# Patient Record
Sex: Male | Born: 1940 | Race: Black or African American | Hispanic: No | Smoking: Former smoker
Health system: Southern US, Community
[De-identification: ages and names within clinical notes are randomized; demographics above are authoritative.]

## PROBLEM LIST (undated history)

## (undated) DIAGNOSIS — Z531 Procedure and treatment not carried out because of patient's decision for reasons of belief and group pressure: Secondary | ICD-10-CM

## (undated) DIAGNOSIS — N289 Disorder of kidney and ureter, unspecified: Secondary | ICD-10-CM

## (undated) DIAGNOSIS — B182 Chronic viral hepatitis C: Secondary | ICD-10-CM

## (undated) DIAGNOSIS — N186 End stage renal disease: Secondary | ICD-10-CM

## (undated) DIAGNOSIS — I5022 Chronic systolic (congestive) heart failure: Secondary | ICD-10-CM

## (undated) DIAGNOSIS — I10 Essential (primary) hypertension: Secondary | ICD-10-CM

## (undated) DIAGNOSIS — C189 Malignant neoplasm of colon, unspecified: Secondary | ICD-10-CM

## (undated) DIAGNOSIS — Z992 Dependence on renal dialysis: Secondary | ICD-10-CM

---

## 2019-11-27 ENCOUNTER — Other Ambulatory Visit: Payer: Self-pay | Admitting: Family Medicine

## 2019-11-27 DIAGNOSIS — N183 Chronic kidney disease, stage 3 unspecified: Secondary | ICD-10-CM

## 2019-11-27 DIAGNOSIS — I10 Essential (primary) hypertension: Secondary | ICD-10-CM

## 2019-12-01 ENCOUNTER — Ambulatory Visit
Admission: RE | Admit: 2019-12-01 | Discharge: 2019-12-01 | Disposition: A | Discharge status: Home / Self Care | Payer: Medicare PPO | Source: Ambulatory Visit | Attending: Family Medicine | Admitting: Family Medicine

## 2019-12-01 DIAGNOSIS — N183 Chronic kidney disease, stage 3 unspecified: Secondary | ICD-10-CM

## 2019-12-01 DIAGNOSIS — I10 Essential (primary) hypertension: Secondary | ICD-10-CM

## 2020-02-28 ENCOUNTER — Other Ambulatory Visit: Payer: Self-pay | Admitting: Physician Assistant

## 2020-02-28 DIAGNOSIS — B1920 Unspecified viral hepatitis C without hepatic coma: Secondary | ICD-10-CM

## 2020-03-20 ENCOUNTER — Ambulatory Visit
Admission: RE | Admit: 2020-03-20 | Discharge: 2020-03-20 | Disposition: A | Discharge status: Home / Self Care | Payer: Medicare Other | Source: Ambulatory Visit | Attending: Physician Assistant | Admitting: Physician Assistant

## 2020-03-20 DIAGNOSIS — B1920 Unspecified viral hepatitis C without hepatic coma: Secondary | ICD-10-CM

## 2020-09-03 ENCOUNTER — Other Ambulatory Visit: Payer: Self-pay | Admitting: Physician Assistant

## 2020-09-03 DIAGNOSIS — K7460 Unspecified cirrhosis of liver: Secondary | ICD-10-CM

## 2020-10-10 ENCOUNTER — Ambulatory Visit
Admission: RE | Admit: 2020-10-10 | Discharge: 2020-10-10 | Disposition: A | Discharge status: Home / Self Care | Payer: Medicare Other | Source: Ambulatory Visit | Attending: Physician Assistant | Admitting: Physician Assistant

## 2020-10-10 DIAGNOSIS — K7460 Unspecified cirrhosis of liver: Secondary | ICD-10-CM

## 2020-11-18 ENCOUNTER — Encounter (HOSPITAL_COMMUNITY): Payer: Self-pay | Admitting: *Deleted

## 2020-11-18 ENCOUNTER — Emergency Department (HOSPITAL_COMMUNITY)
Admission: EM | Admit: 2020-11-18 | Discharge: 2020-11-18 | Disposition: A | Discharge status: Home / Self Care | Payer: Medicare Other | Source: Home / Self Care | Attending: Emergency Medicine | Admitting: Emergency Medicine

## 2020-11-18 ENCOUNTER — Other Ambulatory Visit: Payer: Self-pay

## 2020-11-18 ENCOUNTER — Emergency Department (HOSPITAL_COMMUNITY): Payer: Medicare Other

## 2020-11-18 DIAGNOSIS — I132 Hypertensive heart and chronic kidney disease with heart failure and with stage 5 chronic kidney disease, or end stage renal disease: Secondary | ICD-10-CM | POA: Diagnosis not present

## 2020-11-18 DIAGNOSIS — R0789 Other chest pain: Secondary | ICD-10-CM

## 2020-11-18 DIAGNOSIS — J810 Acute pulmonary edema: Secondary | ICD-10-CM | POA: Diagnosis not present

## 2020-11-18 DIAGNOSIS — I10 Essential (primary) hypertension: Secondary | ICD-10-CM | POA: Insufficient documentation

## 2020-11-18 DIAGNOSIS — Z79899 Other long term (current) drug therapy: Secondary | ICD-10-CM | POA: Insufficient documentation

## 2020-11-18 DIAGNOSIS — R079 Chest pain, unspecified: Secondary | ICD-10-CM | POA: Insufficient documentation

## 2020-11-18 HISTORY — DX: Disorder of kidney and ureter, unspecified: N28.9

## 2020-11-18 LAB — TROPONIN I (HIGH SENSITIVITY)
Troponin I (High Sensitivity): 44 ng/L — ABNORMAL HIGH (ref <18)
Troponin I (High Sensitivity): 45 ng/L — ABNORMAL HIGH (ref <18)

## 2020-11-18 LAB — CBC WITH DIFFERENTIAL/PLATELET
Abs Immature Granulocytes: 0.05 10*3/uL (ref 0.00–0.07)
Basophils Absolute: 0.1 10*3/uL (ref 0.0–0.1)
Basophils Relative: 1 %
Eosinophils Absolute: 0.1 10*3/uL (ref 0.0–0.5)
Eosinophils Relative: 1 %
HCT: 28.6 % — ABNORMAL LOW (ref 39.0–52.0)
Hemoglobin: 9.5 g/dL — ABNORMAL LOW (ref 13.0–17.0)
Immature Granulocytes: 1 %
Lymphocytes Relative: 18 %
Lymphs Abs: 1.7 10*3/uL (ref 0.7–4.0)
MCH: 30.7 pg (ref 26.0–34.0)
MCHC: 33.2 g/dL (ref 30.0–36.0)
MCV: 92.6 fL (ref 80.0–100.0)
Monocytes Absolute: 0.7 10*3/uL (ref 0.1–1.0)
Monocytes Relative: 7 %
Neutro Abs: 7.1 10*3/uL (ref 1.7–7.7)
Neutrophils Relative %: 72 %
Platelets: 170 10*3/uL (ref 150–400)
RBC: 3.09 MIL/uL — ABNORMAL LOW (ref 4.22–5.81)
RDW: 14.5 % (ref 11.5–15.5)
WBC: 9.7 10*3/uL (ref 4.0–10.5)
nRBC: 0 % (ref 0.0–0.2)

## 2020-11-18 LAB — COMPREHENSIVE METABOLIC PANEL
ALT: 31 U/L (ref 0–44)
AST: 18 U/L (ref 15–41)
Albumin: 3 g/dL — ABNORMAL LOW (ref 3.5–5.0)
Alkaline Phosphatase: 116 U/L (ref 38–126)
Anion gap: 12 (ref 5–15)
BUN: 79 mg/dL — ABNORMAL HIGH (ref 8–23)
CO2: 23 mmol/L (ref 22–32)
Calcium: 9.2 mg/dL (ref 8.9–10.3)
Chloride: 104 mmol/L (ref 98–111)
Creatinine, Ser: 9.78 mg/dL — ABNORMAL HIGH (ref 0.61–1.24)
GFR, Estimated: 5 mL/min — ABNORMAL LOW (ref >60)
Glucose, Bld: 97 mg/dL (ref 70–99)
Potassium: 4.4 mmol/L (ref 3.5–5.1)
Sodium: 139 mmol/L (ref 135–145)
Total Bilirubin: 0.9 mg/dL (ref 0.3–1.2)
Total Protein: 7.2 g/dL (ref 6.5–8.1)

## 2020-11-18 NOTE — ED Triage Notes (Signed)
Pt arrived by [facility] from home. Pt reported mid chest pain radiating to left arm. Pt took 2 ASA pta (324mg  each) and that relieved his chest pain pta. Only complaint at this time is generalized weakness. Dialysis pt, due for treatment today.

## 2020-11-18 NOTE — ED Notes (Signed)
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

## 2020-11-18 NOTE — ED Notes (Signed)
ED PA at [facility] 

## 2020-11-18 NOTE — Discharge Instructions (Signed)
Return if any problems.  Follow up with your physician for recheck.  Call your dialysis center

## 2020-11-18 NOTE — ED Provider Notes (Signed)
Emergency Medicine Provider Triage Evaluation Note  [name] Colon , a 80 y.o. male  was evaluated in triage.  Pt complains of chest pain  Review of Systems  Positive: Chest pain Negative: No fever  Physical Exam  BP (!) 168/61 (BP Location: Right Arm)   Pulse 68   Temp 98.6 F (37 C)   Resp 14   SpO2 100%  Gen:   Awake, no distress   Resp:  Normal effort  MSK:   Moves extremities without difficulty  Other:    Medical Decision Making  Medically screening exam initiated at 11:33 AM.  Appropriate orders placed.  Dameion Briles was informed that the remainder of the evaluation will be completed by another provider, this initial triage assessment does not replace that evaluation, and the importance of remaining in the ED until their evaluation is complete.     Fransico Meadow, PA-C 11/18/20 1133    Horton, Alvin Critchley, DO 11/19/20 1507

## 2020-11-18 NOTE — ED Notes (Signed)
Checked on patient. Denies chest pain. Just feels weak. No distress.

## 2020-11-18 NOTE — ED Provider Notes (Signed)
[facility] EMERGENCY DEPARTMENT Provider Note   CSN: [id number] Arrival date & time: 11/18/20  1112     History Chief Complaint  Patient presents with   Chest Pain    [name] Colon is a 80 y.o. male.  Pt reports he began having chest pain this am.  Pt took 2 aspirin and pain was relieved.  Pt was scheduled for dialysis today but did not go because of chest pain.  Pt denies shortness of breath.  Pt reports he still makes some urine and does not feel fluid overloaded   The history is provided by the patient. No language interpreter was used.  Chest Pain Pain location:  L chest Pain quality: aching   Pain radiates to:  Does not radiate Pain severity:  Moderate Onset quality:  Sudden Timing:  Constant Progression:  Worsening Chronicity:  New Relieved by:  Nothing Worsened by:  Nothing Ineffective treatments:  None tried Associated symptoms: no abdominal pain   Risk factors: hypertension       Past Medical History:  Diagnosis Date   Renal disorder     There are no problems to display for this patient.       No family history on file.     Home Medications Prior to Admission medications   Medication Sig Start Date End Date Taking? Authorizing Provider  amLODipine (NORVASC) 10 MG tablet Take 10 mg by mouth daily. 11/16/20  Yes [provider]  Brinzolamide-Brimonidine (SIMBRINZA) 1-0.2 % SUSP Place 1 drop into the left eye 2 (two) times daily.   Yes [provider]  ketorolac (ACULAR) 0.4 % SOLN Place 1 drop into the left eye 4 (four) times daily. 11/05/20  Yes [provider]  multivitamin (RENA-VIT) TABS tablet Take 1 tablet by mouth daily. 11/16/20  Yes [provider]  ofloxacin (OCUFLOX) 0.3 % ophthalmic solution Place 1 drop into the left eye 4 (four) times daily. 11/04/20  Yes [provider]  omeprazole (PRILOSEC) 20 MG capsule Take 20 mg by mouth every evening. 10/29/20  Yes [provider]  prednisoLONE acetate (PRED FORTE) 1 % ophthalmic suspension Place 1 drop into the left eye 4 (four) times daily. 11/04/20  Yes [provider]  torsemide (DEMADEX) 20 MG tablet Take 20 mg by mouth daily. 11/16/20  Yes [provider]    Allergies    Patient has no known allergies.  Review of Systems   Review of Systems  Cardiovascular:  Positive for chest pain.  Gastrointestinal:  Negative for abdominal pain.  All other systems reviewed and are negative.  Physical Exam Updated Vital Signs BP (!) 165/63   Pulse (!) 59   Temp 98.6 F (37 C)   Resp 15   SpO2 99%   Physical Exam Constitutional:      Appearance: He is well-developed.  Cardiovascular:     Rate and Rhythm: Normal rate and regular rhythm.     Heart sounds: Normal heart sounds.  Pulmonary:     Breath sounds: Normal breath sounds.  Abdominal:     Palpations: Abdomen is soft.  Musculoskeletal:        General: Normal range of motion.     Cervical back: Normal range of motion.  Skin:    General: Skin is warm.  Neurological:     General: No focal deficit present.     Mental Status: He is alert.  Psychiatric:        Mood and Affect: Mood normal.  ED Results / Procedures / Treatments   Labs (all labs ordered are listed, but only abnormal results are displayed) Labs Reviewed  CBC WITH DIFFERENTIAL/PLATELET - Abnormal; Notable for the following components:      Result Value   RBC 3.09 (*)    Hemoglobin 9.5 (*)    HCT 28.6 (*)    All other components within normal limits  COMPREHENSIVE METABOLIC PANEL - Abnormal; Notable for the following components:   BUN 79 (*)    Creatinine, Ser 9.78 (*)    Albumin 3.0 (*)    GFR, Estimated 5 (*)    All other components within normal limits  TROPONIN I (HIGH SENSITIVITY) - Abnormal; Notable for the following components:   Troponin I (High Sensitivity) 44 (*)    All other components within normal limits  TROPONIN I (HIGH SENSITIVITY)     EKG None  Radiology DG Chest 2 View  Result Date: 11/18/2020 CLINICAL DATA:  LEFT side chest pain into LEFT arm beginning this morning EXAM: CHEST - 2 VIEW COMPARISON:  None FINDINGS: RIGHT jugular dual lumen central venous catheter with tip projecting over cavoatrial junction. Enlargement of cardiac silhouette with pulmonary vascular congestion. Atherosclerotic calcification aorta. Subsegmental atelectasis RIGHT base. No definite acute infiltrate or pneumothorax. Tiny pleural effusions blunt the posterior costophrenic angles, greater on RIGHT. No acute osseous findings. IMPRESSION: Enlargement of cardiac silhouette with pulmonary vascular congestion. RIGHT basilar atelectasis and tiny pleural effusions. Electronically Signed   By: Lavonia Dana M.D.   On: 11/18/2020 12:38    Procedures Procedures   Medications Ordered in ED Medications - No data to display  ED Course  I have reviewed the triage vital signs and the nursing notes.  Pertinent labs & imaging results that were available during my care of the patient were reviewed by me and considered in my medical decision making (see chart for details).  Clinical Course as of 11/18/20 1727  Mon Nov 18, 2020  1714 Troponin I (High Sensitivity)(!) [LS]    Clinical Course User Index [LS] Fransico Meadow, [place]   MDM Rules/Calculators/A&P                           MDM: EKG no acute abnormality  troponin 44 and 45.  Pt remains pain free.   Dr. Tyrone Nine in to see and examine Pt advised to follow up with his MD.  Call dialysis center to be seen tomorrow  Final Clinical Impression(s) / ED Diagnoses Final diagnoses:  Chest pain    Rx / DC Orders ED Discharge Orders     None     An After Visit Summary was printed and given to the patient.    Fransico Meadow, PA-C 11/18/20 [place], [place], DO 11/18/20 [phone]

## 2020-11-19 ENCOUNTER — Inpatient Hospital Stay (HOSPITAL_COMMUNITY)
Admission: EM | Admit: 2020-11-19 | Discharge: 2020-12-03 | DRG: 280 | Disposition: A | Discharge status: Home Health | Payer: Medicare Other | Attending: Internal Medicine | Admitting: Internal Medicine

## 2020-11-19 ENCOUNTER — Emergency Department (HOSPITAL_COMMUNITY): Payer: Medicare Other

## 2020-11-19 ENCOUNTER — Other Ambulatory Visit: Payer: Self-pay

## 2020-11-19 ENCOUNTER — Encounter (HOSPITAL_COMMUNITY): Payer: Self-pay

## 2020-11-19 DIAGNOSIS — N186 End stage renal disease: Secondary | ICD-10-CM

## 2020-11-19 DIAGNOSIS — E1122 Type 2 diabetes mellitus with diabetic chronic kidney disease: Secondary | ICD-10-CM | POA: Diagnosis present

## 2020-11-19 DIAGNOSIS — R001 Bradycardia, unspecified: Secondary | ICD-10-CM | POA: Diagnosis not present

## 2020-11-19 DIAGNOSIS — Z20822 Contact with and (suspected) exposure to covid-19: Secondary | ICD-10-CM | POA: Diagnosis present

## 2020-11-19 DIAGNOSIS — J9601 Acute respiratory failure with hypoxia: Secondary | ICD-10-CM | POA: Diagnosis present

## 2020-11-19 DIAGNOSIS — I953 Hypotension of hemodialysis: Secondary | ICD-10-CM | POA: Diagnosis not present

## 2020-11-19 DIAGNOSIS — R1013 Epigastric pain: Secondary | ICD-10-CM

## 2020-11-19 DIAGNOSIS — Z8719 Personal history of other diseases of the digestive system: Secondary | ICD-10-CM

## 2020-11-19 DIAGNOSIS — R0603 Acute respiratory distress: Secondary | ICD-10-CM

## 2020-11-19 DIAGNOSIS — J9811 Atelectasis: Secondary | ICD-10-CM | POA: Diagnosis not present

## 2020-11-19 DIAGNOSIS — E871 Hypo-osmolality and hyponatremia: Secondary | ICD-10-CM | POA: Diagnosis not present

## 2020-11-19 DIAGNOSIS — I429 Cardiomyopathy, unspecified: Secondary | ICD-10-CM | POA: Diagnosis present

## 2020-11-19 DIAGNOSIS — R55 Syncope and collapse: Secondary | ICD-10-CM | POA: Diagnosis present

## 2020-11-19 DIAGNOSIS — K7460 Unspecified cirrhosis of liver: Secondary | ICD-10-CM | POA: Diagnosis present

## 2020-11-19 DIAGNOSIS — I214 Non-ST elevation (NSTEMI) myocardial infarction: Secondary | ICD-10-CM | POA: Diagnosis not present

## 2020-11-19 DIAGNOSIS — Z9221 Personal history of antineoplastic chemotherapy: Secondary | ICD-10-CM

## 2020-11-19 DIAGNOSIS — E785 Hyperlipidemia, unspecified: Secondary | ICD-10-CM | POA: Diagnosis present

## 2020-11-19 DIAGNOSIS — K226 Gastro-esophageal laceration-hemorrhage syndrome: Secondary | ICD-10-CM

## 2020-11-19 DIAGNOSIS — K2211 Ulcer of esophagus with bleeding: Secondary | ICD-10-CM | POA: Diagnosis present

## 2020-11-19 DIAGNOSIS — Z992 Dependence on renal dialysis: Secondary | ICD-10-CM

## 2020-11-19 DIAGNOSIS — R079 Chest pain, unspecified: Secondary | ICD-10-CM | POA: Diagnosis present

## 2020-11-19 DIAGNOSIS — Z79899 Other long term (current) drug therapy: Secondary | ICD-10-CM

## 2020-11-19 DIAGNOSIS — R778 Other specified abnormalities of plasma proteins: Secondary | ICD-10-CM

## 2020-11-19 DIAGNOSIS — Z8619 Personal history of other infectious and parasitic diseases: Secondary | ICD-10-CM

## 2020-11-19 DIAGNOSIS — R5381 Other malaise: Secondary | ICD-10-CM | POA: Diagnosis present

## 2020-11-19 DIAGNOSIS — I10 Essential (primary) hypertension: Secondary | ICD-10-CM | POA: Diagnosis present

## 2020-11-19 DIAGNOSIS — Z9049 Acquired absence of other specified parts of digestive tract: Secondary | ICD-10-CM

## 2020-11-19 DIAGNOSIS — K2100 Gastro-esophageal reflux disease with esophagitis, without bleeding: Secondary | ICD-10-CM | POA: Diagnosis present

## 2020-11-19 DIAGNOSIS — D62 Acute posthemorrhagic anemia: Secondary | ICD-10-CM | POA: Diagnosis not present

## 2020-11-19 DIAGNOSIS — K449 Diaphragmatic hernia without obstruction or gangrene: Secondary | ICD-10-CM | POA: Diagnosis present

## 2020-11-19 DIAGNOSIS — Z9115 Patient's noncompliance with renal dialysis: Secondary | ICD-10-CM

## 2020-11-19 DIAGNOSIS — K7469 Other cirrhosis of liver: Secondary | ICD-10-CM | POA: Diagnosis present

## 2020-11-19 DIAGNOSIS — I951 Orthostatic hypotension: Secondary | ICD-10-CM | POA: Diagnosis not present

## 2020-11-19 DIAGNOSIS — E8770 Fluid overload, unspecified: Secondary | ICD-10-CM | POA: Diagnosis present

## 2020-11-19 DIAGNOSIS — I8510 Secondary esophageal varices without bleeding: Secondary | ICD-10-CM | POA: Diagnosis present

## 2020-11-19 DIAGNOSIS — K5904 Chronic idiopathic constipation: Secondary | ICD-10-CM | POA: Diagnosis present

## 2020-11-19 DIAGNOSIS — Z531 Procedure and treatment not carried out because of patient's decision for reasons of belief and group pressure: Secondary | ICD-10-CM | POA: Diagnosis not present

## 2020-11-19 DIAGNOSIS — J810 Acute pulmonary edema: Secondary | ICD-10-CM

## 2020-11-19 DIAGNOSIS — M898X9 Other specified disorders of bone, unspecified site: Secondary | ICD-10-CM | POA: Diagnosis present

## 2020-11-19 DIAGNOSIS — Z85038 Personal history of other malignant neoplasm of large intestine: Secondary | ICD-10-CM

## 2020-11-19 DIAGNOSIS — D696 Thrombocytopenia, unspecified: Secondary | ICD-10-CM | POA: Diagnosis not present

## 2020-11-19 DIAGNOSIS — K254 Chronic or unspecified gastric ulcer with hemorrhage: Secondary | ICD-10-CM | POA: Diagnosis present

## 2020-11-19 DIAGNOSIS — Z87891 Personal history of nicotine dependence: Secondary | ICD-10-CM

## 2020-11-19 DIAGNOSIS — I5043 Acute on chronic combined systolic (congestive) and diastolic (congestive) heart failure: Secondary | ICD-10-CM | POA: Diagnosis present

## 2020-11-19 DIAGNOSIS — Z8546 Personal history of malignant neoplasm of prostate: Secondary | ICD-10-CM

## 2020-11-19 DIAGNOSIS — K920 Hematemesis: Secondary | ICD-10-CM | POA: Diagnosis present

## 2020-11-19 DIAGNOSIS — T17908A Unspecified foreign body in respiratory tract, part unspecified causing other injury, initial encounter: Secondary | ICD-10-CM | POA: Diagnosis not present

## 2020-11-19 DIAGNOSIS — D631 Anemia in chronic kidney disease: Secondary | ICD-10-CM | POA: Diagnosis present

## 2020-11-19 DIAGNOSIS — B182 Chronic viral hepatitis C: Secondary | ICD-10-CM | POA: Diagnosis present

## 2020-11-19 DIAGNOSIS — I132 Hypertensive heart and chronic kidney disease with heart failure and with stage 5 chronic kidney disease, or end stage renal disease: Principal | ICD-10-CM | POA: Diagnosis present

## 2020-11-19 HISTORY — DX: End stage renal disease: N18.6

## 2020-11-19 HISTORY — DX: Malignant neoplasm of colon, unspecified: C18.9

## 2020-11-19 HISTORY — DX: Essential (primary) hypertension: I10

## 2020-11-19 HISTORY — DX: End stage renal disease: Z99.2

## 2020-11-19 HISTORY — DX: Chronic viral hepatitis C: B18.2

## 2020-11-19 MED ORDER — NITROGLYCERIN IN D5W 200-5 MCG/ML-% IV SOLN
0.0000 ug/min | INTRAVENOUS | Status: DC
  Administered 2020-11-19: 5 ug/min via INTRAVENOUS
  Filled 2020-11-19: qty 250

## 2020-11-19 MED ORDER — ENALAPRILAT 1.25 MG/ML IV SOLN
1.2500 mg | Freq: Once | INTRAVENOUS | Status: AC
  Administered 2020-11-20: 1.25 mg via INTRAVENOUS
  Filled 2020-11-19: qty 1

## 2020-11-19 NOTE — ED Triage Notes (Signed)
Brought via [facility] from home. Here yesterday for CP, SOB, weakness. D/c home. Dialysis pt was suppose to have same yesterday however missed treatment. Back today for same symptoms. Audible rales on EMS arrival and noted through out. No stick lt arm. Nitro x 1 sub, PIV 18ga rt fa, EKG.

## 2020-11-19 NOTE — ED Notes (Signed)
Radiology at bedside at this time.

## 2020-11-19 NOTE — ED Provider Notes (Signed)
[facility] EMERGENCY DEPARTMENT Provider Note   CSN: [id number] Arrival date & time: 11/19/20  2318     History Chief Complaint  Patient presents with   Shortness of Breath   Chest Pain   Weakness    [name] Colon is a 80 y.o. male.  80 yo M with a chief complaints of chest pain and shortness of breath.  Patient was actually seen here yesterday for the same.  Patient had missed dialysis yesterday and had attempted to get in touch with his dialysis center today but unfortunately was unable to get dialysis today.  Found to be acutely short of breath with EMS and started on nonrebreather and then BiPAP.  Initial blood pressure over 834 systolic.  Improved with 1 nitro tablet.  The history is provided by the patient.  Shortness of Breath Severity:  Severe Onset quality:  Sudden Duration:  2 hours Timing:  Constant Progression:  Worsening Chronicity:  Recurrent Relieved by:  Nothing Worsened by:  Nothing Ineffective treatments:  None tried Associated symptoms: chest pain and cough   Associated symptoms: no abdominal pain, no fever, no headaches, no rash and no vomiting   Chest Pain Associated symptoms: cough, shortness of breath and weakness   Associated symptoms: no abdominal pain, no fever, no headache, no palpitations and no vomiting   Weakness Associated symptoms: chest pain, cough and shortness of breath   Associated symptoms: no abdominal pain, no arthralgias, no diarrhea, no fever, no headaches, no myalgias and no vomiting       Past Medical History:  Diagnosis Date   Renal disorder     There are no problems to display for this patient.   History reviewed. No pertinent surgical history.     No family history on file.     Home Medications Prior to Admission medications   Medication Sig Start Date End Date Taking? Authorizing Provider  amLODipine (NORVASC) 10 MG tablet Take 10 mg by mouth daily. 11/16/20   [provider]   Brinzolamide-Brimonidine (SIMBRINZA) 1-0.2 % SUSP Place 1 drop into the left eye 2 (two) times daily.    [provider]  ketorolac (ACULAR) 0.4 % SOLN Place 1 drop into the left eye 4 (four) times daily. 11/05/20   [provider]  multivitamin (RENA-VIT) TABS tablet Take 1 tablet by mouth daily. 11/16/20   [provider]  ofloxacin (OCUFLOX) 0.3 % ophthalmic solution Place 1 drop into the left eye 4 (four) times daily. 11/04/20   [provider]  omeprazole (PRILOSEC) 20 MG capsule Take 20 mg by mouth every evening. 10/29/20   [provider]  prednisoLONE acetate (PRED FORTE) 1 % ophthalmic suspension Place 1 drop into the left eye 4 (four) times daily. 11/04/20   [provider]  torsemide (DEMADEX) 20 MG tablet Take 20 mg by mouth daily. 11/16/20   [provider]    Allergies    Patient has no known allergies.  Review of Systems   Review of Systems  Constitutional:  Negative for chills and fever.  HENT:  Negative for congestion and facial swelling.   Eyes:  Negative for discharge and visual disturbance.  Respiratory:  Positive for cough and shortness of breath.   Cardiovascular:  Positive for chest pain. Negative for palpitations.  Gastrointestinal:  Negative for abdominal pain, diarrhea and vomiting.  Musculoskeletal:  Negative for arthralgias and myalgias.  Skin:  Negative for color change and rash.  Neurological:  Positive for weakness. Negative for  tremors, syncope and headaches.  Psychiatric/Behavioral:  Negative for confusion and dysphoric mood.    Physical Exam Updated Vital Signs BP (!) 141/68   Pulse 73   Temp (!) 96.5 F (35.8 C) (Axillary)   Resp 17   Ht 5\' 8"  (1.727 m)   Wt 78.9 kg   SpO2 100%   BMI 26.46 kg/m   Physical Exam Vitals and nursing note reviewed.  Constitutional:      Appearance: He is well-developed.  HENT:     Head: Normocephalic and atraumatic.  Eyes:     Pupils: Pupils are equal,  round, and reactive to light.  Neck:     Vascular: JVD present.  Cardiovascular:     Rate and Rhythm: Normal rate and regular rhythm.     Heart sounds: No murmur heard.   No friction rub. No gallop.  Pulmonary:     Effort: No respiratory distress.     Breath sounds: Rhonchi present. No wheezing.     Comments: Diffuse rhonchi. Abdominal:     General: There is no distension.     Tenderness: There is no abdominal tenderness. There is no guarding or rebound.  Musculoskeletal:        General: Normal range of motion.     Cervical back: Normal range of motion and neck supple.  Skin:    Coloration: Skin is not pale.     Findings: No rash.  Neurological:     Mental Status: He is alert and oriented to person, place, and time.  Psychiatric:        Behavior: Behavior normal.    ED Results / Procedures / Treatments   Labs (all labs ordered are listed, but only abnormal results are displayed) Labs Reviewed  CBC WITH DIFFERENTIAL/PLATELET - Abnormal; Notable for the following components:      Result Value   WBC 12.6 (*)    RBC 2.70 (*)    Hemoglobin 8.4 (*)    HCT 25.3 (*)    Neutro Abs 10.5 (*)    All other components within normal limits  COMPREHENSIVE METABOLIC PANEL - Abnormal; Notable for the following components:   CO2 21 (*)    Glucose, Bld 179 (*)    BUN 100 (*)    Creatinine, Ser 11.39 (*)    Albumin 3.0 (*)    GFR, Estimated 4 (*)    All other components within normal limits  I-STAT CHEM 8, ED - Abnormal; Notable for the following components:   BUN 93 (*)    Creatinine, Ser 11.40 (*)    Glucose, Bld 166 (*)    Hemoglobin 8.8 (*)    HCT 26.0 (*)    All other components within normal limits  TROPONIN I (HIGH SENSITIVITY) - Abnormal; Notable for the following components:   Troponin I (High Sensitivity) 84 (*)    All other components within normal limits  TROPONIN I (HIGH SENSITIVITY) - Abnormal; Notable for the following components:   Troponin I (High Sensitivity) 102  (*)    All other components within normal limits  RESP PANEL BY RT-PCR (FLU A&B, COVID) [id number]    EKG EKG Interpretation  Date/Time:  Tuesday November 19 2020 23:25:01 EDT Ventricular Rate:  88 PR Interval:  194 QRS Duration: 93 QT Interval:  385 QTC Calculation: 466 R Axis:   79 Text Interpretation: Sinus rhythm Minimal ST depression, diffuse leads Otherwise no significant change Confirmed by Deno Etienne [phone]) on 11/19/2020 11:30:36 PM  Radiology DG Chest 2 View  Result Date: 11/18/2020 CLINICAL DATA:  LEFT side chest pain into LEFT arm beginning this morning EXAM: CHEST - 2 VIEW COMPARISON:  None FINDINGS: RIGHT jugular dual lumen central venous catheter with tip projecting over cavoatrial junction. Enlargement of cardiac silhouette with pulmonary vascular congestion. Atherosclerotic calcification aorta. Subsegmental atelectasis RIGHT base. No definite acute infiltrate or pneumothorax. Tiny pleural effusions blunt the posterior costophrenic angles, greater on RIGHT. No acute osseous findings. IMPRESSION: Enlargement of cardiac silhouette with pulmonary vascular congestion. RIGHT basilar atelectasis and tiny pleural effusions. Electronically Signed   By: Lavonia Dana M.D.   On: 11/18/2020 12:38   DG Chest Port 1 View  Result Date: 11/20/2020 CLINICAL DATA:  Chest pain short of breath EXAM: PORTABLE CHEST 1 VIEW COMPARISON:  11/18/2020 FINDINGS: Right-sided central venous catheter tip over the cavoatrial region. Increased small bilateral pleural effusions. Stable cardiomediastinal silhouette. Worsened vascular congestion and development of mild pulmonary edema. Aortic atherosclerosis. IMPRESSION: Increased vascular congestion and pulmonary edema compared to prior with development of small right greater than left pleural effusion Electronically Signed   By: Donavan Foil M.D.   On: 11/20/2020 00:00    Procedures Procedures   Medications Ordered in ED Medications  nitroGLYCERIN 50 mg in  dextrose 5 % 250 mL (0.2 mg/mL) infusion (5 mcg/min Intravenous New Bag/Given 11/19/20 2351)  Chlorhexidine Gluconate Cloth 2 % PADS 6 each (has no administration in time range)  aspirin chewable tablet 324 mg (has no administration in time range)  enalaprilat (VASOTEC) injection 1.25 mg (1.25 mg Intravenous Given 11/20/20 0046)    ED Course  I have reviewed the triage vital signs and the nursing notes.  Pertinent labs & imaging results that were available during my care of the patient were reviewed by me and considered in my medical decision making (see chart for details).    MDM Rules/Calculators/A&P                           80 yo M with a chief complaints of chest pain and shortness of breath.  Started suddenly here this evening.  Had a similar event yesterday and was seen in the ED but had recovered and had flat troponins and elected for discharge.  Likely in acute pulmonary edema based on history.  Currently on BiPAP and doing okay.  We will lower his blood pressure.  Check blood work.  Likely will need emergent dialysis.  I discussed the case with nephrology unfortunately no dialysis availability until the morning.  Patient's initial troponin is a bit elevated that it was yesterday may be secondary to demand with acute pulmonary edema we will check a delta.  Delta also continues to trend upwards.  I did discuss the case with Dr. Clayton Bibles, cardiology fellow on-call recommended obs under hospitalist with echo and serial troponins.  CRITICAL CARE Performed by: Cecilio Asper   Total critical care time: 35 minutes  Critical care time was exclusive of separately billable procedures and treating other patients.  Critical care was necessary to treat or prevent imminent or life-threatening deterioration.  Critical care was time spent personally by me on the following activities: development of treatment plan with patient and/or surrogate as well as nursing, discussions with  consultants, evaluation of patient's response to treatment, examination of patient, obtaining history from patient or surrogate, ordering and performing treatments and interventions, ordering and review of laboratory studies, ordering and review of radiographic studies, pulse oximetry and re-evaluation of patient's condition.  Final Clinical Impression(s) / ED Diagnoses Final diagnoses:  Acute pulmonary edema ([place])  Troponin level elevated    Rx / DC Orders ED Discharge Orders     None        Deno Etienne, DO 11/20/20 0340

## 2020-11-19 NOTE — ED Notes (Signed)
Provider at bedside

## 2020-11-19 NOTE — ED Notes (Signed)
resp at bedside

## 2020-11-20 ENCOUNTER — Encounter (HOSPITAL_COMMUNITY): Payer: Self-pay | Admitting: Emergency Medicine

## 2020-11-20 ENCOUNTER — Inpatient Hospital Stay (HOSPITAL_COMMUNITY): Payer: Medicare Other

## 2020-11-20 ENCOUNTER — Encounter: Payer: Self-pay | Admitting: Internal Medicine

## 2020-11-20 DIAGNOSIS — R5381 Other malaise: Secondary | ICD-10-CM | POA: Diagnosis present

## 2020-11-20 DIAGNOSIS — J810 Acute pulmonary edema: Secondary | ICD-10-CM | POA: Diagnosis present

## 2020-11-20 DIAGNOSIS — R001 Bradycardia, unspecified: Secondary | ICD-10-CM | POA: Diagnosis not present

## 2020-11-20 DIAGNOSIS — J9811 Atelectasis: Secondary | ICD-10-CM | POA: Diagnosis not present

## 2020-11-20 DIAGNOSIS — I214 Non-ST elevation (NSTEMI) myocardial infarction: Secondary | ICD-10-CM | POA: Diagnosis not present

## 2020-11-20 DIAGNOSIS — E8770 Fluid overload, unspecified: Secondary | ICD-10-CM | POA: Diagnosis present

## 2020-11-20 DIAGNOSIS — K226 Gastro-esophageal laceration-hemorrhage syndrome: Secondary | ICD-10-CM | POA: Diagnosis not present

## 2020-11-20 DIAGNOSIS — D696 Thrombocytopenia, unspecified: Secondary | ICD-10-CM | POA: Diagnosis not present

## 2020-11-20 DIAGNOSIS — J9601 Acute respiratory failure with hypoxia: Secondary | ICD-10-CM

## 2020-11-20 DIAGNOSIS — D631 Anemia in chronic kidney disease: Secondary | ICD-10-CM | POA: Diagnosis present

## 2020-11-20 DIAGNOSIS — I5043 Acute on chronic combined systolic (congestive) and diastolic (congestive) heart failure: Secondary | ICD-10-CM | POA: Diagnosis present

## 2020-11-20 DIAGNOSIS — K5904 Chronic idiopathic constipation: Secondary | ICD-10-CM | POA: Diagnosis present

## 2020-11-20 DIAGNOSIS — I5022 Chronic systolic (congestive) heart failure: Secondary | ICD-10-CM | POA: Diagnosis not present

## 2020-11-20 DIAGNOSIS — K449 Diaphragmatic hernia without obstruction or gangrene: Secondary | ICD-10-CM | POA: Diagnosis present

## 2020-11-20 DIAGNOSIS — D649 Anemia, unspecified: Secondary | ICD-10-CM | POA: Diagnosis not present

## 2020-11-20 DIAGNOSIS — K920 Hematemesis: Secondary | ICD-10-CM | POA: Diagnosis present

## 2020-11-20 DIAGNOSIS — I132 Hypertensive heart and chronic kidney disease with heart failure and with stage 5 chronic kidney disease, or end stage renal disease: Secondary | ICD-10-CM | POA: Diagnosis present

## 2020-11-20 DIAGNOSIS — Z515 Encounter for palliative care: Secondary | ICD-10-CM | POA: Diagnosis not present

## 2020-11-20 DIAGNOSIS — Z992 Dependence on renal dialysis: Secondary | ICD-10-CM

## 2020-11-20 DIAGNOSIS — I10 Essential (primary) hypertension: Secondary | ICD-10-CM | POA: Diagnosis not present

## 2020-11-20 DIAGNOSIS — B182 Chronic viral hepatitis C: Secondary | ICD-10-CM | POA: Insufficient documentation

## 2020-11-20 DIAGNOSIS — Z20822 Contact with and (suspected) exposure to covid-19: Secondary | ICD-10-CM | POA: Diagnosis present

## 2020-11-20 DIAGNOSIS — R778 Other specified abnormalities of plasma proteins: Secondary | ICD-10-CM

## 2020-11-20 DIAGNOSIS — K2211 Ulcer of esophagus with bleeding: Secondary | ICD-10-CM | POA: Diagnosis present

## 2020-11-20 DIAGNOSIS — I951 Orthostatic hypotension: Secondary | ICD-10-CM | POA: Diagnosis not present

## 2020-11-20 DIAGNOSIS — E1122 Type 2 diabetes mellitus with diabetic chronic kidney disease: Secondary | ICD-10-CM | POA: Diagnosis present

## 2020-11-20 DIAGNOSIS — I429 Cardiomyopathy, unspecified: Secondary | ICD-10-CM | POA: Diagnosis present

## 2020-11-20 DIAGNOSIS — D62 Acute posthemorrhagic anemia: Secondary | ICD-10-CM | POA: Diagnosis not present

## 2020-11-20 DIAGNOSIS — R55 Syncope and collapse: Secondary | ICD-10-CM | POA: Diagnosis not present

## 2020-11-20 DIAGNOSIS — Z7189 Other specified counseling: Secondary | ICD-10-CM | POA: Diagnosis not present

## 2020-11-20 DIAGNOSIS — I8510 Secondary esophageal varices without bleeding: Secondary | ICD-10-CM | POA: Diagnosis present

## 2020-11-20 DIAGNOSIS — R0609 Other forms of dyspnea: Secondary | ICD-10-CM | POA: Diagnosis not present

## 2020-11-20 DIAGNOSIS — N186 End stage renal disease: Secondary | ICD-10-CM | POA: Diagnosis present

## 2020-11-20 DIAGNOSIS — I953 Hypotension of hemodialysis: Secondary | ICD-10-CM | POA: Diagnosis not present

## 2020-11-20 DIAGNOSIS — I259 Chronic ischemic heart disease, unspecified: Secondary | ICD-10-CM

## 2020-11-20 DIAGNOSIS — E785 Hyperlipidemia, unspecified: Secondary | ICD-10-CM | POA: Diagnosis present

## 2020-11-20 DIAGNOSIS — K7460 Unspecified cirrhosis of liver: Secondary | ICD-10-CM | POA: Diagnosis not present

## 2020-11-20 DIAGNOSIS — E871 Hypo-osmolality and hyponatremia: Secondary | ICD-10-CM | POA: Diagnosis not present

## 2020-11-20 DIAGNOSIS — K254 Chronic or unspecified gastric ulcer with hemorrhage: Secondary | ICD-10-CM | POA: Diagnosis present

## 2020-11-20 DIAGNOSIS — C189 Malignant neoplasm of colon, unspecified: Secondary | ICD-10-CM | POA: Insufficient documentation

## 2020-11-20 LAB — CBC
HCT: 24.3 % — ABNORMAL LOW (ref 39.0–52.0)
HCT: 22.3 % — ABNORMAL LOW (ref 39.0–52.0)
Hemoglobin: 8.2 g/dL — ABNORMAL LOW (ref 13.0–17.0)
Hemoglobin: 7.4 g/dL — ABNORMAL LOW (ref 13.0–17.0)
MCH: 31.4 pg (ref 26.0–34.0)
MCH: 30.8 pg (ref 26.0–34.0)
MCHC: 33.7 g/dL (ref 30.0–36.0)
MCHC: 33.2 g/dL (ref 30.0–36.0)
MCV: 93.1 fL (ref 80.0–100.0)
MCV: 92.9 fL (ref 80.0–100.0)
Platelets: 164 10*3/uL (ref 150–400)
Platelets: 166 10*3/uL (ref 150–400)
RBC: 2.61 MIL/uL — ABNORMAL LOW (ref 4.22–5.81)
RBC: 2.4 MIL/uL — ABNORMAL LOW (ref 4.22–5.81)
RDW: 14.7 % (ref 11.5–15.5)
RDW: 14.6 % (ref 11.5–15.5)
WBC: 12.3 10*3/uL — ABNORMAL HIGH (ref 4.0–10.5)
WBC: 11.6 10*3/uL — ABNORMAL HIGH (ref 4.0–10.5)
nRBC: 0 % (ref 0.0–0.2)
nRBC: 0 % (ref 0.0–0.2)

## 2020-11-20 LAB — CBC WITH DIFFERENTIAL/PLATELET
Abs Immature Granulocytes: 0.04 10*3/uL (ref 0.00–0.07)
Basophils Absolute: 0 10*3/uL (ref 0.0–0.1)
Basophils Relative: 0 %
Eosinophils Absolute: 0.1 10*3/uL (ref 0.0–0.5)
Eosinophils Relative: 1 %
HCT: 25.3 % — ABNORMAL LOW (ref 39.0–52.0)
Hemoglobin: 8.4 g/dL — ABNORMAL LOW (ref 13.0–17.0)
Immature Granulocytes: 0 %
Lymphocytes Relative: 10 %
Lymphs Abs: 1.3 10*3/uL (ref 0.7–4.0)
MCH: 31.1 pg (ref 26.0–34.0)
MCHC: 33.2 g/dL (ref 30.0–36.0)
MCV: 93.7 fL (ref 80.0–100.0)
Monocytes Absolute: 0.6 10*3/uL (ref 0.1–1.0)
Monocytes Relative: 5 %
Neutro Abs: 10.5 10*3/uL — ABNORMAL HIGH (ref 1.7–7.7)
Neutrophils Relative %: 84 %
Platelets: 180 10*3/uL (ref 150–400)
RBC: 2.7 MIL/uL — ABNORMAL LOW (ref 4.22–5.81)
RDW: 14.7 % (ref 11.5–15.5)
WBC: 12.6 10*3/uL — ABNORMAL HIGH (ref 4.0–10.5)
nRBC: 0 % (ref 0.0–0.2)

## 2020-11-20 LAB — RENAL FUNCTION PANEL
Albumin: 2.7 g/dL — ABNORMAL LOW (ref 3.5–5.0)
Anion gap: 9 (ref 5–15)
BUN: 78 mg/dL — ABNORMAL HIGH (ref 8–23)
CO2: 23 mmol/L (ref 22–32)
Calcium: 9.2 mg/dL (ref 8.9–10.3)
Chloride: 106 mmol/L (ref 98–111)
Creatinine, Ser: 8.24 mg/dL — ABNORMAL HIGH (ref 0.61–1.24)
GFR, Estimated: 6 mL/min — ABNORMAL LOW (ref >60)
Glucose, Bld: 113 mg/dL — ABNORMAL HIGH (ref 70–99)
Phosphorus: 3.5 mg/dL (ref 2.5–4.6)
Potassium: 4.4 mmol/L (ref 3.5–5.1)
Sodium: 138 mmol/L (ref 135–145)

## 2020-11-20 LAB — I-STAT CHEM 8, ED
BUN: 93 mg/dL — ABNORMAL HIGH (ref 8–23)
Calcium, Ion: 1.18 mmol/L (ref 1.15–1.40)
Chloride: 107 mmol/L (ref 98–111)
Creatinine, Ser: 11.4 mg/dL — ABNORMAL HIGH (ref 0.61–1.24)
Glucose, Bld: 166 mg/dL — ABNORMAL HIGH (ref 70–99)
HCT: 26 % — ABNORMAL LOW (ref 39.0–52.0)
Hemoglobin: 8.8 g/dL — ABNORMAL LOW (ref 13.0–17.0)
Potassium: 4 mmol/L (ref 3.5–5.1)
Sodium: 140 mmol/L (ref 135–145)
TCO2: 22 mmol/L (ref 22–32)

## 2020-11-20 LAB — COMPREHENSIVE METABOLIC PANEL
ALT: 26 U/L (ref 0–44)
AST: 22 U/L (ref 15–41)
Albumin: 3 g/dL — ABNORMAL LOW (ref 3.5–5.0)
Alkaline Phosphatase: 110 U/L (ref 38–126)
Anion gap: 11 (ref 5–15)
BUN: 100 mg/dL — ABNORMAL HIGH (ref 8–23)
CO2: 21 mmol/L — ABNORMAL LOW (ref 22–32)
Calcium: 9.1 mg/dL (ref 8.9–10.3)
Chloride: 105 mmol/L (ref 98–111)
Creatinine, Ser: 11.39 mg/dL — ABNORMAL HIGH (ref 0.61–1.24)
GFR, Estimated: 4 mL/min — ABNORMAL LOW (ref >60)
Glucose, Bld: 179 mg/dL — ABNORMAL HIGH (ref 70–99)
Potassium: 4.1 mmol/L (ref 3.5–5.1)
Sodium: 137 mmol/L (ref 135–145)
Total Bilirubin: 0.8 mg/dL (ref 0.3–1.2)
Total Protein: 6.9 g/dL (ref 6.5–8.1)

## 2020-11-20 LAB — ECHOCARDIOGRAM COMPLETE
Area-P 1/2: 3.7 cm2
Height: 68 in
S' Lateral: 3.5 cm
Weight: 2784 oz

## 2020-11-20 LAB — GLUCOSE, CAPILLARY: Glucose-Capillary: 129 mg/dL — ABNORMAL HIGH (ref 70–99)

## 2020-11-20 LAB — TROPONIN I (HIGH SENSITIVITY)
Troponin I (High Sensitivity): 102 ng/L (ref <18)
Troponin I (High Sensitivity): 84 ng/L — ABNORMAL HIGH (ref <18)

## 2020-11-20 LAB — HEPATITIS B SURFACE ANTIGEN: Hepatitis B Surface Ag: NONREACTIVE

## 2020-11-20 LAB — RESP PANEL BY RT-PCR (FLU A&B, COVID) ARPGX2
Influenza A by PCR: NEGATIVE
Influenza B by PCR: NEGATIVE
SARS Coronavirus 2 by RT PCR: NEGATIVE

## 2020-11-20 LAB — MRSA NEXT GEN BY PCR, NASAL: MRSA by PCR Next Gen: NOT DETECTED

## 2020-11-20 LAB — LACTIC ACID, PLASMA
Lactic Acid, Venous: 1.5 mmol/L (ref 0.5–1.9)
Lactic Acid, Venous: 1 mmol/L (ref 0.5–1.9)

## 2020-11-20 MED ORDER — ONDANSETRON HCL 4 MG/2ML IJ SOLN: 4.0000 mg | Freq: Four times a day (QID) | INTRAMUSCULAR | Status: DC | PRN

## 2020-11-20 MED ORDER — HYDRALAZINE HCL 20 MG/ML IJ SOLN: 5.0000 mg | INTRAMUSCULAR | Status: DC | PRN

## 2020-11-20 MED ORDER — SODIUM CHLORIDE 0.9 % IV SOLN: 100.0000 mL | INTRAVENOUS | Status: DC | PRN

## 2020-11-20 MED ORDER — BRIMONIDINE TARTRATE 0.2 % OP SOLN
1.0000 [drp] | Freq: Two times a day (BID) | OPHTHALMIC | Status: DC
  Administered 2020-11-20 – 2020-11-27 (×13): 1 [drp] via OPHTHALMIC
  Filled 2020-11-20 (×3): qty 5

## 2020-11-20 MED ORDER — HEPARIN SODIUM (PORCINE) 1000 UNIT/ML DIALYSIS
1000.0000 [IU] | INTRAMUSCULAR | Status: DC | PRN
  Filled 2020-11-20: qty 1

## 2020-11-20 MED ORDER — CAMPHOR-MENTHOL 0.5-0.5 % EX LOTN
1.0000 "application " | TOPICAL_LOTION | Freq: Three times a day (TID) | CUTANEOUS | Status: DC | PRN
  Filled 2020-11-20: qty 222

## 2020-11-20 MED ORDER — CALCIUM CARBONATE ANTACID 1250 MG/5ML PO SUSP
500.0000 mg | Freq: Four times a day (QID) | ORAL | Status: DC | PRN
  Administered 2020-11-20 – 2020-11-27 (×4): 500 mg via ORAL
  Filled 2020-11-20 (×5): qty 5

## 2020-11-20 MED ORDER — PANTOPRAZOLE SODIUM 40 MG IV SOLR
40.0000 mg | Freq: Two times a day (BID) | INTRAVENOUS | Status: DC
  Administered 2020-11-20 – 2020-11-24 (×8): 40 mg via INTRAVENOUS
  Filled 2020-11-20 (×8): qty 40

## 2020-11-20 MED ORDER — SODIUM CHLORIDE 0.9 % IV SOLN: 3.0000 g | INTRAVENOUS | Status: DC

## 2020-11-20 MED ORDER — ALTEPLASE 2 MG IJ SOLR
2.0000 mg | Freq: Once | INTRAMUSCULAR | Status: DC | PRN
  Filled 2020-11-20: qty 2

## 2020-11-20 MED ORDER — HEPARIN SODIUM (PORCINE) 1000 UNIT/ML IJ SOLN
INTRAMUSCULAR | Status: AC
  Filled 2020-11-20: qty 4

## 2020-11-20 MED ORDER — SODIUM CHLORIDE 0.9 % IV SOLN
3.0000 g | Freq: Two times a day (BID) | INTRAVENOUS | Status: DC
  Administered 2020-11-20: 3 g via INTRAVENOUS
  Filled 2020-11-20: qty 8

## 2020-11-20 MED ORDER — KETOROLAC TROMETHAMINE 0.4 % OP SOLN: 1.0000 [drp] | Freq: Four times a day (QID) | OPHTHALMIC | Status: DC

## 2020-11-20 MED ORDER — ACETAMINOPHEN 325 MG PO TABS
650.0000 mg | ORAL_TABLET | Freq: Four times a day (QID) | ORAL | Status: DC | PRN
  Administered 2020-12-02: 650 mg via ORAL
  Filled 2020-11-20: qty 2

## 2020-11-20 MED ORDER — LIDOCAINE-PRILOCAINE 2.5-2.5 % EX CREA
1.0000 "application " | TOPICAL_CREAM | CUTANEOUS | Status: DC | PRN
  Filled 2020-11-20: qty 5

## 2020-11-20 MED ORDER — HEPARIN SODIUM (PORCINE) 1000 UNIT/ML DIALYSIS: 2400.0000 [IU] | Freq: Once | INTRAMUSCULAR | Status: DC

## 2020-11-20 MED ORDER — ACETAMINOPHEN 650 MG RE SUPP: 650.0000 mg | Freq: Four times a day (QID) | RECTAL | Status: DC | PRN

## 2020-11-20 MED ORDER — RENA-VITE PO TABS
1.0000 | ORAL_TABLET | Freq: Every day | ORAL | Status: DC
  Administered 2020-11-21 – 2020-12-03 (×11): 1 via ORAL
  Filled 2020-11-20 (×12): qty 1

## 2020-11-20 MED ORDER — BRINZOLAMIDE 1 % OP SUSP
1.0000 [drp] | Freq: Two times a day (BID) | OPHTHALMIC | Status: DC
  Administered 2020-11-20 – 2020-11-27 (×13): 1 [drp] via OPHTHALMIC
  Filled 2020-11-20 (×2): qty 10

## 2020-11-20 MED ORDER — SODIUM CHLORIDE 0.9% FLUSH
3.0000 mL | Freq: Two times a day (BID) | INTRAVENOUS | Status: DC
  Administered 2020-11-20 – 2020-12-03 (×21): 3 mL via INTRAVENOUS

## 2020-11-20 MED ORDER — OFLOXACIN 0.3 % OP SOLN
1.0000 [drp] | Freq: Four times a day (QID) | OPHTHALMIC | Status: DC
  Administered 2020-11-20 – 2020-12-03 (×13): 1 [drp] via OPHTHALMIC
  Filled 2020-11-20 (×2): qty 5

## 2020-11-20 MED ORDER — SORBITOL 70 % SOLN
30.0000 mL | Status: DC | PRN
  Administered 2020-11-28: 30 mL via ORAL
  Filled 2020-11-20: qty 30

## 2020-11-20 MED ORDER — CHLORHEXIDINE GLUCONATE CLOTH 2 % EX PADS
6.0000 | MEDICATED_PAD | Freq: Every day | CUTANEOUS | Status: DC
  Administered 2020-11-21 – 2020-12-03 (×13): 6 via TOPICAL

## 2020-11-20 MED ORDER — NEPRO/CARBSTEADY PO LIQD: 237.0000 mL | Freq: Three times a day (TID) | ORAL | Status: DC | PRN

## 2020-11-20 MED ORDER — LIDOCAINE HCL (PF) 1 % IJ SOLN: 5.0000 mL | INTRAMUSCULAR | Status: DC | PRN

## 2020-11-20 MED ORDER — ZOLPIDEM TARTRATE 5 MG PO TABS: 5.0000 mg | ORAL_TABLET | Freq: Every evening | ORAL | Status: DC | PRN

## 2020-11-20 MED ORDER — HYDROXYZINE HCL 25 MG PO TABS: 25.0000 mg | ORAL_TABLET | Freq: Three times a day (TID) | ORAL | Status: DC | PRN

## 2020-11-20 MED ORDER — ASPIRIN 81 MG PO CHEW
324.0000 mg | CHEWABLE_TABLET | Freq: Once | ORAL | Status: AC
  Administered 2020-11-20: 324 mg via ORAL
  Filled 2020-11-20: qty 4

## 2020-11-20 MED ORDER — KETOROLAC TROMETHAMINE 0.5 % OP SOLN
1.0000 [drp] | Freq: Four times a day (QID) | OPHTHALMIC | Status: DC
  Administered 2020-11-20 – 2020-12-03 (×45): 1 [drp] via OPHTHALMIC
  Filled 2020-11-20: qty 5

## 2020-11-20 MED ORDER — PREDNISOLONE ACETATE 1 % OP SUSP
1.0000 [drp] | Freq: Four times a day (QID) | OPHTHALMIC | Status: DC
  Administered 2020-11-20 – 2020-12-03 (×42): 1 [drp] via OPHTHALMIC
  Filled 2020-11-20: qty 5

## 2020-11-20 MED ORDER — ONDANSETRON HCL 4 MG PO TABS: 4.0000 mg | ORAL_TABLET | Freq: Four times a day (QID) | ORAL | Status: DC | PRN

## 2020-11-20 MED ORDER — PENTAFLUOROPROP-TETRAFLUOROETH EX AERO
1.0000 "application " | INHALATION_SPRAY | CUTANEOUS | Status: DC | PRN
  Filled 2020-11-20: qty 116

## 2020-11-20 MED ORDER — DOCUSATE SODIUM 283 MG RE ENEM
1.0000 | ENEMA | RECTAL | Status: DC | PRN
  Administered 2020-11-28: 283 mg via RECTAL
  Filled 2020-11-20 (×2): qty 1

## 2020-11-20 NOTE — Progress Notes (Signed)
  Echocardiogram 2D Echocardiogram has been performed.  [name] Colon G Cyndra Feinberg 11/20/2020, 11:09 AM

## 2020-11-20 NOTE — ED Notes (Signed)
Provider at bedside

## 2020-11-20 NOTE — Consult Note (Signed)
Cardiology Consultation:   Patient ID: [name] Colon MRN: [id number]; DOB: [birth year]  Admit date: 11/19/2020 Date of Consult: 11/20/2020  PCP:  Leonel Ramsay, MD   [facility] Providers Cardiologist:  None        Patient Profile:   [name] Colon is a 80 y.o. male with a hx of end-stage renal disease who is being seen 11/20/2020 for the evaluation of vasovagal syncope at the request of Dr. Lorin Mercy.  History of Present Illness:   [name] Colon with end-stage renal disease had 2 episodes of unresponsiveness during a bout of nausea vomiting hematemesis during hemodialysis with bradycardia noted into the 30s on telemetry consistent with vasovagal syncope.  He has classic prodrome of feeling poor, sweating, nauseous.  He recently had cataract surgery last week.  He is also missed hemodialysis since last Friday.  Spoke with family in room.  Troponin mildly elevated to 102 from 40.  Echocardiogram with mildly reduced ejection fraction of 45 to 50%.  Currently comfortable.     Past Medical History:  Diagnosis Date   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[place])    Colon cancer ([place])    ESRD (end stage renal disease) on dialysis ([place])    Hypertension     History reviewed. No pertinent surgical history.     Inpatient Medications: Scheduled Meds:  brinzolamide  1 drop Left Eye BID   And   brimonidine  1 drop Left Eye BID   Chlorhexidine Gluconate Cloth  6 each Topical Q0600   heparin sodium (porcine)       ketorolac  1 drop Left Eye QID   multivitamin  1 tablet Oral Daily   ofloxacin  1 drop Left Eye QID   pantoprazole (PROTONIX) IV  40 mg Intravenous Q12H   prednisoLONE acetate  1 drop Left Eye QID   sodium chloride flush  3 mL Intravenous Q12H   Continuous Infusions:  [START ON 11/21/2020] ampicillin-sulbactam (UNASYN) IV     nitroGLYCERIN 5 mcg/min (11/19/20 2351)   PRN Meds: acetaminophen **OR** acetaminophen, calcium carbonate (dosed in mg elemental calcium),  camphor-menthol **AND** hydrOXYzine, docusate sodium, feeding supplement (NEPRO CARB STEADY), hydrALAZINE, lidocaine (PF), lidocaine-prilocaine, ondansetron **OR** ondansetron (ZOFRAN) IV, pentafluoroprop-tetrafluoroeth, sorbitol, zolpidem  Allergies:   No Known Allergies  Social History:   Social History   Socioeconomic History   Marital status: Widowed    Spouse name: Not on file   Number of children: Not on file   Years of education: Not on file   Highest education level: Not on file  Occupational History   Not on file  Tobacco Use   Smoking status: Unknown   Smokeless tobacco: Not on file  Substance and Sexual Activity   Alcohol use: Not Currently   Drug use: Not Currently   Sexual activity: Not on file  Other Topics Concern   Not on file  Social History Narrative   Not on file   Social Determinants of Health   Financial Resource Strain: Not on file  Food Insecurity: Not on file  Transportation Needs: Not on file  Physical Activity: Not on file  Stress: Not on file  Social Connections: Not on file  Intimate Partner Violence: Not on file    Family History:   History reviewed. No pertinent family history.   ROS:  Please see the history of present illness.   All other ROS reviewed and negative.     Physical Exam/Data:   Vitals:   11/20/20 1011 11/20/20 1300 11/20/20 1400 11/20/20  1600  BP: (!) 159/60 (!) 152/65 (!) 146/59 (!) 159/66  Pulse: 68 70  68  Resp: 17 (!) 21 20 19   Temp: 97.6 F (36.4 C) 98 F (36.7 C)  97.8 F (36.6 C)  TempSrc: Oral Oral  Oral  SpO2: 100% 97%  98%  Weight:      Height:        Intake/Output Summary (Last 24 hours) at 11/20/2020 1810 Last data filed at 11/20/2020 1600 Gross per 24 hour  Intake 0 ml  Output --  Net 0 ml   Last 3 Weights 11/20/2020 11/19/2020  Weight (lbs) (No Data) 174 lb  Weight (kg) (No Data) 78.926 kg     Body mass index is 26.46 kg/m.  General:  Well nourished, well developed, in no acute  distress HEENT: normal Lymph: no adenopathy Neck: no JVD Endocrine:  No thryomegaly Vascular: No carotid bruits; FA pulses 2+ bilaterally without bruits  Cardiac:  normal S1, S2; RRR; no murmur  Lungs:  clear to auscultation bilaterally, no wheezing, rhonchi or rales  Abd: soft, nontender, no hepatomegaly  Ext: no edema Musculoskeletal:  No deformities, BUE and BLE strength normal and equal Skin: warm and dry  Neuro:  CNs 2-12 intact, no focal abnormalities noted Psych:  Normal affect   EKG:  The EKG was personally reviewed and demonstrates: EKG showed normal sinus rhythm rate 88 with nonspecific ST-T wave changes.  Telemetry:  Telemetry was personally reviewed and demonstrates: Currently normal sinus rhythm  Relevant CV Studies: Troponin from 44, 45, 84, 102  Laboratory Data:  High Sensitivity Troponin:   Recent Labs  Lab 11/18/20 1146 11/18/20 1520 11/19/20 2341 11/20/20 0155  TROPONINIHS 44* 45* 84* 102*     Chemistry Recent Labs  Lab 11/18/20 1146 11/19/20 2341 11/20/20 0006 11/20/20 0827  NA 139 137 140 138  K 4.4 4.1 4.0 4.4  CL 104 105 107 106  CO2 23 21*  --  23  GLUCOSE 97 179* 166* 113*  BUN 79* 100* 93* 78*  CREATININE 9.78* 11.39* 11.40* 8.24*  CALCIUM 9.2 9.1  --  9.2  GFRNONAA 5* 4*  --  6*  ANIONGAP 12 11  --  9    Recent Labs  Lab 11/18/20 1146 11/19/20 2341 11/20/20 0827  PROT 7.2 6.9  --   ALBUMIN 3.0* 3.0* 2.7*  AST 18 22  --   ALT 31 26  --   ALKPHOS 116 110  --   BILITOT 0.9 0.8  --    Hematology Recent Labs  Lab 11/19/20 2341 11/20/20 0006 11/20/20 1018 11/20/20 1455  WBC 12.6*  --  12.3* 11.6*  RBC 2.70*  --  2.61* 2.40*  HGB 8.4* 8.8* 8.2* 7.4*  HCT 25.3* 26.0* 24.3* 22.3*  MCV 93.7  --  93.1 92.9  MCH 31.1  --  31.4 30.8  MCHC 33.2  --  33.7 33.2  RDW 14.7  --  14.7 14.6  PLT 180  --  164 166   BNPNo results for input(s): BNP, PROBNP in the last 168 hours.  DDimer No results for input(s): DDIMER in the last 168  hours.   Radiology/Studies:  DG Chest 2 View  Result Date: 11/18/2020 CLINICAL DATA:  LEFT side chest pain into LEFT arm beginning this morning EXAM: CHEST - 2 VIEW COMPARISON:  None FINDINGS: RIGHT jugular dual lumen central venous catheter with tip projecting over cavoatrial junction. Enlargement of cardiac silhouette with pulmonary vascular congestion. Atherosclerotic calcification aorta. Subsegmental atelectasis  RIGHT base. No definite acute infiltrate or pneumothorax. Tiny pleural effusions blunt the posterior costophrenic angles, greater on RIGHT. No acute osseous findings. IMPRESSION: Enlargement of cardiac silhouette with pulmonary vascular congestion. RIGHT basilar atelectasis and tiny pleural effusions. Electronically Signed   By: Lavonia Dana M.D.   On: 11/18/2020 12:38   DG Chest Port 1 View  Result Date: 11/20/2020 CLINICAL DATA:  Chest pain short of breath EXAM: PORTABLE CHEST 1 VIEW COMPARISON:  11/18/2020 FINDINGS: Right-sided central venous catheter tip over the cavoatrial region. Increased small bilateral pleural effusions. Stable cardiomediastinal silhouette. Worsened vascular congestion and development of mild pulmonary edema. Aortic atherosclerosis. IMPRESSION: Increased vascular congestion and pulmonary edema compared to prior with development of small right greater than left pleural effusion Electronically Signed   By: Donavan Foil M.D.   On: 11/20/2020 00:00   ECHOCARDIOGRAM COMPLETE  Result Date: 11/20/2020    ECHOCARDIOGRAM REPORT   Patient Name:   RC AMISON Date of Exam: 11/20/2020 Medical Rec #:  [id number]        Height:       68.0 in Accession #:    [id number]       Weight:       174.0 lb Date of Birth:  03/29/1941         BSA:          1.926 m Patient Age:    32 years         BP:           159/60 mmHg Patient Gender: M                HR:           71 bpm. Exam Location:  Inpatient Procedure: 2D Echo, Cardiac Doppler and Color Doppler                          STAT ECHO  Reported to: Dr Feliberto Gottron on 11/20/2020 11:08:00 AM. Indications:    Dyspnea R06.00  History:        Patient has no prior history of Echocardiogram examinations.                 Renal Disorder.  Sonographer:    Tiffany Dance RVT Referring Phys: [place]  1. Endocardial border tracking is poor resulting in inaccurate automated LVEF assessment. Mild, global hypokinesis. Left ventricular ejection fraction, by estimation, is 45 to 50%. The left ventricle has mildly decreased function. The left ventricle demonstrates global hypokinesis. Left ventricular diastolic parameters are consistent with Grade I diastolic dysfunction (impaired relaxation). Elevated left ventricular end-diastolic pressure.  2. Right ventricular systolic function is normal. The right ventricular size is normal. There is moderately elevated pulmonary artery systolic pressure.  3. Left atrial size was moderately dilated.  4. The mitral valve is normal in structure. Mild mitral valve regurgitation. No evidence of mitral stenosis.  5. The aortic valve is tricuspid. There is mild calcification of the aortic valve. There is mild thickening of the aortic valve. Aortic valve regurgitation is mild. No aortic stenosis is present.  6. Aortic dilatation noted. There is borderline dilatation of the aortic root, measuring 36 mm. There is mild dilatation of the ascending aorta, measuring 37 mm.  7. The inferior vena cava is normal in size with greater than 50% respiratory variability, suggesting right atrial pressure of 3 mmHg. FINDINGS  Left Ventricle: Endocardial border tracking is poor resulting in inaccurate automated LVEF  assessment. Mild, global hypokinesis. Left ventricular ejection fraction, by estimation, is 45 to 50%. The left ventricle has mildly decreased function. The left ventricle demonstrates global hypokinesis. The left ventricular internal cavity size was normal in size. There is no left ventricular hypertrophy. Left  ventricular diastolic parameters are consistent with Grade I diastolic dysfunction (impaired relaxation). Elevated left ventricular end-diastolic pressure. Right Ventricle: The right ventricular size is normal. No increase in right ventricular wall thickness. Right ventricular systolic function is normal. There is moderately elevated pulmonary artery systolic pressure. The tricuspid regurgitant velocity is 3.39 m/s, and with an assumed right atrial pressure of 3 mmHg, the estimated right ventricular systolic pressure is 60.7 mmHg. Left Atrium: Left atrial size was moderately dilated. Right Atrium: Right atrial size was normal in size. Pericardium: There is no evidence of pericardial effusion. Mitral Valve: The mitral valve is normal in structure. Mild mitral valve regurgitation. No evidence of mitral valve stenosis. Tricuspid Valve: The tricuspid valve is normal in structure. Tricuspid valve regurgitation is trivial. No evidence of tricuspid stenosis. Aortic Valve: The aortic valve is tricuspid. There is mild calcification of the aortic valve. There is mild thickening of the aortic valve. Aortic valve regurgitation is mild. No aortic stenosis is present. Pulmonic Valve: The pulmonic valve was normal in structure. Pulmonic valve regurgitation is not visualized. No evidence of pulmonic stenosis. Aorta: Aortic dilatation noted. There is borderline dilatation of the aortic root, measuring 36 mm. There is mild dilatation of the ascending aorta, measuring 37 mm. Venous: The inferior vena cava is normal in size with greater than 50% respiratory variability, suggesting right atrial pressure of 3 mmHg. IAS/Shunts: No atrial level shunt detected by color flow Doppler.  LEFT VENTRICLE PLAX 2D LVIDd:         4.60 cm  Diastology LVIDs:         3.50 cm  LV e' medial:    6.42 cm/s LV PW:         1.30 cm  LV E/e' medial:  18.7 LV IVS:        1.00 cm  LV e' lateral:   7.18 cm/s LVOT diam:     2.00 cm  LV E/e' lateral: 16.7 LV SV:          75 LV SV Index:   39 LVOT Area:     3.14 cm  RIGHT VENTRICLE             IVC RV Basal diam:  3.70 cm     IVC diam: 1.80 cm RV Mid diam:    2.40 cm RV S prime:     17.40 cm/s TAPSE (M-mode): 2.1 cm LEFT ATRIUM             Index       RIGHT ATRIUM           Index LA diam:        3.60 cm 1.87 cm/m  RA Area:     18.10 cm LA Vol (A2C):   89.1 ml 46.25 ml/m RA Volume:   52.50 ml  27.25 ml/m LA Vol (A4C):   56.5 ml 29.33 ml/m LA Biplane Vol: 72.2 ml 37.48 ml/m  AORTIC VALVE LVOT Vmax:   121.00 cm/s LVOT Vmean:  78.500 cm/s LVOT VTI:    0.238 m  AORTA Ao Root diam: 3.60 cm Ao Asc diam:  3.70 cm MITRAL VALVE                TRICUSPID VALVE MV  Area (PHT): 3.70 cm     TR Peak grad:   46.0 mmHg MV Decel Time: 205 msec     TR Vmax:        339.00 cm/s MV E velocity: 120.00 cm/s MV A velocity: 108.00 cm/s  SHUNTS MV E/A ratio:  1.11         Systemic VTI:  0.24 m                             Systemic Diam: 2.00 cm Skeet Latch MD Electronically signed by Skeet Latch MD Signature Date/Time: 11/20/2020/11:36:18 AM    Final      Assessment and Plan:   80 year old on hemodialysis hepatitis C associated cirrhosis hypertension who had 2 episodes while on BiPAP and hemodialysis of unresponsiveness with bradycardia into the 30s with hematemesis/vomiting.  Vasovagal syncope - Agree with diagnosis given his hematemesis/vomiting/bradycardia.  He vagal down and became unresponsive secondary to both cardioinhibitory as well as vasodepressive response.  Hemodialysis stopped appropriately at that time. -Try to avoid offending vagal stimuli, avoid vomiting/hematemesis. -No indication for pacemaker. -His family member is wondering if recent eyedrops post cataract may be precipitating these episodes?  Chronic combined systolic and diastolic heart failure - Echocardiogram shows EF of 45 to 50% mildly reduced.   -We will hold off on beta-blocker given marked bradycardia in the setting of increased vagal tone. -No  angiotensin receptor blocker or other goal-directed medical therapy secondary to end-stage renal disease.   Risk Assessment/Risk Scores:    For questions or updates, please contact [place] Please consult [email or web address] for contact info under    Signed, Candee Furbish, MD  11/20/2020 6:10 PM

## 2020-11-20 NOTE — ED Notes (Signed)
Called to room for pt to use the restroom. Provided pt with a urinal at this time

## 2020-11-20 NOTE — ED Notes (Signed)
Pt removed from bedpan. Cleaned and adjusted in bed. UA sample obtained and sent to the lab

## 2020-11-20 NOTE — Procedures (Signed)
Patient seen and examined  on Hemodialysis. BP (!) 159/60 (BP Location: Right Arm)   Pulse 68   Temp 97.6 F (36.4 C) (Oral)   Resp 17   Ht 5\' 8"  (1.727 m)   Wt 78.9 kg   SpO2 100%   BMI 26.46 kg/m   QB 400 mL/ min via TDC, UF goal 4.5L  2 episodes of unresponsiveness, vomited blood, treatment terminated- send to [facility].   PCCM came to eval, appreciate assistance To SDU- primary team updated by me   Madelon Lips MD [place] Pgr [phone] 11:38 AM

## 2020-11-20 NOTE — ED Notes (Signed)
Alarm is peeping on bipap machine and pt states he feels like it isn't tight enough. resp called at this time

## 2020-11-20 NOTE — Consult Note (Addendum)
Reason for Consult: To manage dialysis and dialysis related needs Referring Physician: Dr Leandra Kern is an 80 y.o. male.  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    Pt was evaluated in ED on Monday for CP which was thought to be atypical- discharged from the ED.  Wasn't able to attend his nromal dialysis rx and wasn't able to reschedule.  He came in last night with resp distress.  On BiPaP and nitro gtt.  We were called and since he was stable on these temporizing measures it was decided that he was able to wait for HD until AM.    He was on dialysis and had an episode of unresponsiveness on BiPaP.  Was laid back and didn't require meds/ compressions.  Treatment continued.  He had a second episode of unresponsiveness during which he was bradycardic to the 30s and received several compressions.  He vomited blood.  BiPaP mask taken off quickly.  Code team called, treatment terminated, and pt was taken to [facility].     Dialyzes at [facility] MWF 4 hrs EDW 77.5 kg F180  3K. 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 No pertinent surgical history.  No family history on file.  Social History:  has no history on file for tobacco use, alcohol use, and drug use.  Allergies: No Known Allergies  Medications: Scheduled:  brinzolamide  1 drop Left Eye BID   And   brimonidine  1 drop Left Eye BID   Chlorhexidine Gluconate Cloth  6 each Topical  Q0600   heparin sodium (porcine)       ketorolac  1 drop Left Eye QID   multivitamin  1 tablet Oral Daily   ofloxacin  1 drop Left Eye QID   pantoprazole (PROTONIX) IV  40 mg Intravenous Q12H   prednisoLONE acetate  1 drop Left Eye QID   sodium chloride flush  3 mL Intravenous Q12H     Results for orders placed or performed during the hospital encounter of 11/19/20 (from the past 48 hour(s))  CBC with Differential     Status: Abnormal   Collection Time: 11/19/20 11:41 PM  Result Value Ref Range   WBC 12.6 (H) 4.0 - 10.5 K/uL   RBC 2.70 (L) 4.22 - 5.81 MIL/uL   Hemoglobin 8.4 (L) 13.0 - 17.0 g/dL   HCT 25.3 (L) 39.0 - 52.0 %   MCV 93.7 80.0 - 100.0 fL   MCH 31.1 26.0 - 34.0 pg   MCHC 33.2 30.0 - 36.0 g/dL   RDW 14.7 11.5 - 15.5 %   Platelets 180 150 - 400 K/uL   nRBC 0.0 0.0 - 0.2 %   Neutrophils Relative % 84 %   Neutro Abs 10.5 (H) 1.7 - 7.7 K/uL   Lymphocytes Relative 10 %   Lymphs Abs 1.3 0.7 -  4.0 K/uL   Monocytes Relative 5 %   Monocytes Absolute 0.6 0.1 - 1.0 K/uL   Eosinophils Relative 1 %   Eosinophils Absolute 0.1 0.0 - 0.5 K/uL   Basophils Relative 0 %   Basophils Absolute 0.0 0.0 - 0.1 K/uL   Immature Granulocytes 0 %   Abs Immature Granulocytes 0.04 0.00 - 0.07 K/uL    Comment: Performed at [facility], [place] [address], [place], [place] [address]  Comprehensive metabolic panel     Status: Abnormal   Collection Time: 11/19/20 11:41 PM  Result Value Ref Range   Sodium 137 135 - 145 mmol/L   Potassium 4.1 3.5 - 5.1 mmol/L   Chloride 105 98 - 111 mmol/L   CO2 21 (L) 22 - 32 mmol/L   Glucose, Bld 179 (H) 70 - 99 mg/dL    Comment: Glucose reference range applies only to samples taken after fasting for at least 8 hours.   BUN 100 (H) 8 - 23 mg/dL   Creatinine, Ser 11.39 (H) 0.61 - 1.24 mg/dL    Comment: DELTA CHECK NOTED   Calcium 9.1 8.9 - 10.3 mg/dL   Total Protein 6.9 6.5 - 8.1 g/dL   Albumin 3.0 (L) 3.5 - 5.0 g/dL   AST 22 15 - 41 U/L   ALT 26 0 -  44 U/L   Alkaline Phosphatase 110 38 - 126 U/L   Total Bilirubin 0.8 0.3 - 1.2 mg/dL   GFR, Estimated 4 (L) >60 mL/min    Comment: (NOTE) Calculated using the CKD-EPI Creatinine Equation (2021)    Anion gap 11 5 - 15    Comment: Performed at [facility] [address], [place], [place] [address]  Troponin I (High Sensitivity)     Status: Abnormal   Collection Time: 11/19/20 11:41 PM  Result Value Ref Range   Troponin I (High Sensitivity) 84 (H) <18 ng/L    Comment: RESULT CALLED TO, READ BACK BY AND VERIFIED WITH: L. ELLIS, RN. @0058  [id number] BLANKENSHIP R.  (NOTE) Elevated high sensitivity troponin I (hsTnI) values and significant  changes across serial measurements may suggest ACS but many other  chronic and acute conditions are known to elevate hsTnI results.  Refer to the Links section for chest pain algorithms and additional  guidance. Performed at [facility], [place] [address] [place], [place] [address]   Resp Panel by RT-PCR (Flu A&B, Covid) Nasopharyngeal Swab     Status: None   Collection Time: 11/20/20 12:02 AM   Specimen: Nasopharyngeal Swab; Nasopharyngeal(NP) swabs in vial transport medium  Result Value Ref Range   SARS Coronavirus 2 by RT PCR NEGATIVE NEGATIVE    Comment: (NOTE) SARS-CoV-2 target nucleic acids are NOT DETECTED.  The SARS-CoV-2 RNA is generally detectable in upper respiratory specimens during the acute phase of infection. The lowest concentration of SARS-CoV-2 viral copies this assay can detect is 138 copies/mL. A negative result does not preclude SARS-Cov-2 infection and should not be used as the sole basis for treatment or other patient management decisions. A negative result may occur with  improper specimen collection/handling, submission of specimen other than nasopharyngeal swab, presence of viral mutation(s) within the areas targeted by this assay, and inadequate number of viral copies(<138 copies/mL). A negative result must  be combined with clinical observations, patient history, and epidemiological information. The expected result is Negative.  Fact Sheet for Patients:  EntrepreneurPulse.com.au  Fact Sheet for Healthcare Providers:  IncredibleEmployment.be  This test  is no t yet approved or cleared by the [place] and  has been authorized for detection and/or diagnosis of SARS-CoV-2 by FDA under an Emergency Use Authorization (EUA). This EUA will remain  in effect (meaning this test can be used) for the duration of the COVID-19 declaration under Section 564(b)(1) of the Act, 21 U.S.C.section 360bbb-3(b)(1), unless the authorization is terminated  or revoked sooner.       Influenza A by PCR NEGATIVE NEGATIVE   Influenza B by PCR NEGATIVE NEGATIVE    Comment: (NOTE) The Xpert Xpress SARS-CoV-2/FLU/RSV plus assay is intended as an aid in the diagnosis of influenza from Nasopharyngeal swab specimens and should not be used as a sole basis for treatment. Nasal washings and aspirates are unacceptable for Xpert Xpress SARS-CoV-2/FLU/RSV testing.  Fact Sheet for Patients: EntrepreneurPulse.com.au  Fact Sheet for Healthcare Providers: IncredibleEmployment.be  This test is not yet approved or cleared by the [place] FDA and has been authorized for detection and/or diagnosis of SARS-CoV-2 by FDA under an Emergency Use Authorization (EUA). This EUA will remain in effect (meaning this test can be used) for the duration of the COVID-19 declaration under Section 564(b)(1) of the Act, 21 U.S.C. section 360bbb-3(b)(1), unless the authorization is terminated or revoked.  Performed at [facility], [place] [address], [place], [place] [address]   I-stat chem 8, ED (not at [facility] or [facility])     Status: Abnormal   Collection Time: 11/20/20 12:06 AM  Result Value Ref Range   Sodium 140 135 - 145 mmol/L   Potassium 4.0 3.5 - 5.1  mmol/L   Chloride 107 98 - 111 mmol/L   BUN 93 (H) 8 - 23 mg/dL   Creatinine, Ser 11.40 (H) 0.61 - 1.24 mg/dL   Glucose, Bld 166 (H) 70 - 99 mg/dL    Comment: Glucose reference range applies only to samples taken after fasting for at least 8 hours.   Calcium, Ion 1.18 1.15 - 1.40 mmol/L   TCO2 22 22 - 32 mmol/L   Hemoglobin 8.8 (L) 13.0 - 17.0 g/dL   HCT 26.0 (L) 39.0 - 52.0 %  Troponin I (High Sensitivity)     Status: Abnormal   Collection Time: 11/20/20  1:55 AM  Result Value Ref Range   Troponin I (High Sensitivity) 102 (HH) <18 ng/L    Comment: CRITICAL VALUE NOTED.  VALUE IS CONSISTENT WITH PREVIOUSLY REPORTED AND CALLED VALUE. (NOTE) Elevated high sensitivity troponin I (hsTnI) values and significant  changes across serial measurements may suggest ACS but many other  chronic and acute conditions are known to elevate hsTnI results.  Refer to the Links section for chest pain algorithms and additional  guidance. Performed at [facility], [place] [address] [place], [place] [address]     DG Chest 2 View  Result Date: 11/18/2020 CLINICAL DATA:  LEFT side chest pain into LEFT arm beginning this morning EXAM: CHEST - 2 VIEW COMPARISON:  None FINDINGS: RIGHT jugular dual lumen central venous catheter with tip projecting over cavoatrial junction. Enlargement of cardiac silhouette with pulmonary vascular congestion. Atherosclerotic calcification aorta. Subsegmental atelectasis RIGHT base. No definite acute infiltrate or pneumothorax. Tiny pleural effusions blunt the posterior costophrenic angles, greater on RIGHT. No acute osseous findings. IMPRESSION: Enlargement of cardiac silhouette with pulmonary vascular congestion. RIGHT basilar atelectasis and tiny pleural effusions. Electronically Signed   By: Lavonia Dana M.D.   On: 11/18/2020 12:38   DG Chest [facility] 1 View  Result  Date: 11/20/2020 CLINICAL DATA:  Chest pain short of breath EXAM: PORTABLE CHEST 1 VIEW COMPARISON:  11/18/2020  FINDINGS: Right-sided central venous catheter tip over the cavoatrial region. Increased small bilateral pleural effusions. Stable cardiomediastinal silhouette. Worsened vascular congestion and development of mild pulmonary edema. Aortic atherosclerosis. IMPRESSION: Increased vascular congestion and pulmonary edema compared to prior with development of small right greater than left pleural effusion Electronically Signed   By: Donavan Foil M.D.   On: 11/20/2020 00:00    ROS: all other systems reviewed and are negative except as per HPI Blood pressure (!) 145/69, pulse 61, temperature (!) 97.1 F (36.2 C), temperature source Axillary, resp. rate 18, height 5\' 8"  (1.727 m), weight 78.9 kg, SpO2 100 %. Physical exam (this is post 2nd episode) GEN sitting in bed, BiPaP removed, vomit on shirt HEENT EOMI PERRL, BiPaP removed, blood in mask NECK no overt JVD PULM clear anteriorly CV RRR ABD soft, mildly distended EXT no real LE edema NEURO AAO x 3 nonfocal   Assessment/Plan: 1 SOB/ respiratory distress: missed HD on Monday- likely volume overload.  Rx terminated d/t pt instability- we will need to restart later today as a separate, HD staff aware.  Possbily aspirated during vomiting episode on BiPaP so on Unasyn too. 2.  Suspected UGIB: no documented varices 03/2020 in [facility]'s system.  GI consulting.  Pt is a Jehovah's witness--> does NOT want blood products 3.  Chest pain: trops uptrending, ? NSTEMI- not on hep gtt, likely not a good idea given #2 4 ESRD: MWF, usually on 3K bath.  Will reattempt rx in his room later today 5 Hypertension: not on any BP meds as OP per chart- pt's dtr says he's on amlodipine 10 mg daily.  On torsemide 20 mg on non-dialysis days. 6 Anemia of ESRD:  not due for mircera yet- anticipate he will need an early dose 7. Metabolic Bone Disease: calcitriol with rx, Auryxia as binder 8.  Hep C cirrhosis- follows at [facility], cured 2014 per their notes 9.  H/o prostate cancer 10:  h/o colon cancer 11: Dispo: transferrred to [place], Benjamine Mola 11/20/2020, 9:17 AM

## 2020-11-20 NOTE — Consult Note (Signed)
NAME:  [name] Colon, MRN:  [id number], DOB:  1940-12-31, LOS: 0 ADMISSION DATE:  11/19/2020, CONSULTATION DATE:  11/20/20 REFERRING MD:  Dr. Hollie Salk, CHIEF COMPLAINT:  Unresponsive, Hematemesis    History of present illness   [name] Colon is a 80 y.o. male with medical history significant of colon cancer; ESRD on HD; Hep C-associated cirrhosis; and HTN presenting with chest pain in the setting of missed HD.   He was found to be acutely [facility], with Blood pressures in the 220s, with CXR showing vascular congestion. He was placed on BiPAP and nitro drip in the setting of pulmonary flash edema. Nephrology was consulted for urgent dialysis.   While receiving dialysis, patient became unresponsive and was bradycardic into the 30s, with hematemesis. Code Blue was called and Rapid Response and PCCM arrived at bedside.    2 episodes in HD while on BIPAP, unresponsive, brady into 30s, with questionable vagal response.  +hematemesis.  PCCM and Rapid Response responded.  Off BIPAP and A&O x 3 on 3L [place] O2.  Will go to progressive under TRH.  HD stopped for now, but volume overloaded and so will need later today.  PCCM has evaluated.  Repeat labs ordered, Echo ordered, will need GI.  Going to [place].  BP 156/66, NSR now in 50-60s, 100% on [place] O2.  Started on Unasyn and IV Protonix BID.  Past Medical History   Past Medical History:  Diagnosis Date   Renal disorder      Significant Hospital Events   8/17: Code Blue, admitting to [facility]  Consults:  Nephrology   Micro Data:  MRSA Nasal Swab: Pending   Antimicrobials:  Unasyn: 8/17>  Interim history/subjective:  PCCM consulted to HD for an episode of unresponsiveness with bradycardia and Hematemesis while on BiPAP. Patient seen at bedside, on 3L supplemental O2, BP of 159/60 with a pulse of 65. He is AAOx4. He denies any further episodes of hematemesis or nausea. Denies chest pain, abdominal pain. He states that he does not recall the events because he  was asleep.   Objective   Blood pressure (!) 159/60, pulse 68, temperature 97.6 F (36.4 C), temperature source Oral, resp. rate 17, height 5\' 8"  (1.727 m), weight 78.9 kg, SpO2 100 %.       No intake or output data in the 24 hours ending 11/20/20 1029 Filed Weights   11/19/20 2331  Weight: 78.9 kg    Examination: Physical Exam Constitutional:      Comments: NAD, answering questions appropriately, AAOx4  Cardiovascular:     Rate and Rhythm: Normal rate and regular rhythm.     Heart sounds: No murmur heard.   No friction rub. No gallop.  Pulmonary:     Effort: Pulmonary effort is normal.     Breath sounds: No wheezing or rales.     Comments: Saturating at 98% on 3L O2 on [place] Coarse breath sounds appreciated bilaterally Abdominal:     General: Abdomen is flat. Bowel sounds are normal. There is no distension.     Tenderness: There is no abdominal tenderness.  Skin:    General: Skin is warm.  Neurological:     Mental Status: He is alert and oriented to person, place, and time.  Psychiatric:        Mood and Affect: Mood normal.        Behavior: Behavior normal.    Assessment & Plan:  [name] Colon is an 80 y/o M with a PMH colon cancer; ESRD on  HD; Hep C-associated cirrhosis; and HTN presenting with CP in the setting of missed HD session, and PCCM consulted for unresponsiveness and hematemesis.   Unresponsive/Syncopal Episodes:  2x episodes with bradycardia and unresponsiveness. During first episode patient was responsive to sternal rub, and during the second event received 1 minute of ACLS protocol, before becoming responsive. Patient does not recall any dizziness, light headedness and endorses being asleep at the time. Likely vasovagal vs cardiogenic in etiology. He is not on BB at home, but is on amlodipine, which can cause bradycardia. Given that he was bradycardic, while sleeping and his age, favor vasovagal response. He does have an increase in his troponin level, with an  initial EKG in NSR with minimal ST depressions. Will initiate workup for cardiac causes.  - Echo - Repeat EKG - Telemetry  - CMP - Hold home amlodipine.  - Transfer to Progressive  Hypervolemia and Chest Pain in the Setting of Missed Dialysis ESRD Hypertension:  Patient presenting with Pulmonary congestion, increased BP, and elevated JVD with a likely syncopal event x2 in the Dialysis unit, dialysis was terminated after the second syncopal event. Nephrology will continue to assist with dialysis. Currently on nitro drip with systolic pressures in the 150s. Will likely resolve with subsequent dialysis sessions.   Hematemesis:  Patient presenting with two episodes of hematemesis while wearing BiPAP, no further episodes after BiPAP was removed, likely mallory weiss tear. Will likely need a GI consultation. High probability that he aspirated during this event. Will start broad spectrum antibiotics.  - Consult to GI - Start Unasyn - Protonix 40 mg IV BID  Elevated Troponin:  Likely demand ischemia in the setting of pulmonary edema and elevated pressures. ED provider discussed with cardiology in the ED.  - Trend Troponin - Echo  Best practice:  Diet: NPO DVT prophylaxis: SCDs GI prophylaxis: PPI Code Status: FULL Family Communication: Per Primary    Labs   CBC: Recent Labs  Lab 11/18/20 1146 11/19/20 2341 11/20/20 0006  WBC 9.7 12.6*  --   NEUTROABS 7.1 10.5*  --   HGB 9.5* 8.4* 8.8*  HCT 28.6* 25.3* 26.0*  MCV 92.6 93.7  --   PLT 170 180  --     Basic Metabolic Panel: Recent Labs  Lab 11/18/20 1146 11/19/20 2341 11/20/20 0006 11/20/20 0827  NA 139 137 140 138  K 4.4 4.1 4.0 4.4  CL 104 105 107 106  CO2 23 21*  --  23  GLUCOSE 97 179* 166* 113*  BUN 79* 100* 93* 78*  CREATININE 9.78* 11.39* 11.40* 8.24*  CALCIUM 9.2 9.1  --  9.2  PHOS  --   --   --  3.5   GFR: Estimated Creatinine Clearance: 6.7 mL/min (A) (by C-G formula based on SCr of 8.24 mg/dL  (H)). Recent Labs  Lab 11/18/20 1146 11/19/20 2341  WBC 9.7 12.6*    Liver Function Tests: Recent Labs  Lab 11/18/20 1146 11/19/20 2341 11/20/20 0827  AST 18 22  --   ALT 31 26  --   ALKPHOS 116 110  --   BILITOT 0.9 0.8  --   PROT 7.2 6.9  --   ALBUMIN 3.0* 3.0* 2.7*   No results for input(s): LIPASE, AMYLASE in the last 168 hours. No results for input(s): AMMONIA in the last 168 hours.  ABG    Component Value Date/Time   TCO2 22 11/20/2020 0006     Coagulation Profile: No results for input(s): INR, PROTIME in  the last 168 hours.  Cardiac Enzymes: No results for input(s): CKTOTAL, CKMB, CKMBINDEX, TROPONINI in the last 168 hours.  HbA1C: No results found for: HGBA1C  CBG: No results for input(s): GLUCAP in the last 168 hours.  Review of Systems:   Negative otherwise stated above.   Past Medical History  He,  has a past medical history of Renal disorder.   Surgical History   History reviewed. No pertinent surgical history.   Social History      Family History   His family history is not on file.   Allergies No Known Allergies   Home Medications  Prior to Admission medications   Medication Sig Start Date End Date Taking? Authorizing Provider  amLODipine (NORVASC) 10 MG tablet Take 10 mg by mouth daily. 11/16/20  Yes [provider]  Brinzolamide-Brimonidine (SIMBRINZA) 1-0.2 % SUSP Place 1 drop into the left eye 2 (two) times daily.   Yes [provider]  ketorolac (ACULAR) 0.4 % SOLN Place 1 drop into the left eye 4 (four) times daily. 11/05/20  Yes [provider]  multivitamin (RENA-VIT) TABS tablet Take 1 tablet by mouth daily. 11/16/20  Yes [provider]  ofloxacin (OCUFLOX) 0.3 % ophthalmic solution Place 1 drop into the left eye 4 (four) times daily. 11/04/20  Yes [provider]  omeprazole (PRILOSEC) 20 MG capsule Take 20 mg by mouth every evening. 10/29/20  Yes [provider]  prednisoLONE  acetate (PRED FORTE) 1 % ophthalmic suspension Place 1 drop into the left eye 4 (four) times daily. 11/04/20  Yes [provider]  torsemide (DEMADEX) 20 MG tablet Take 20 mg by mouth daily. 11/16/20  Yes [provider]     Maudie Mercury, MD IMTS, PGY-3 Pager: [phone] 11/20/2020,12:01 PM

## 2020-11-20 NOTE — Progress Notes (Signed)
Pt presented to [facility] ED 2 days in a row with c/o CP and missed HD.  Now presents with HTN and pulmonary edema.  He was placed on bipap and nitroglycerin drip.  Currently stable per ED MD.  HD nurse currently caring for other patients and will not be available till am.  Will order HD first shift later today.  Electrolytes stable.

## 2020-11-20 NOTE — Code Documentation (Signed)
  Patient Name: [name] Colon   MRN: [id number]   Date of Birth/ Sex: 1941/03/13 , male      Admission Date: 11/19/2020  Attending Provider: Karmen Bongo, MD  Primary Diagnosis: <principal problem not specified>    Indication: Pt was in his usual state of health until this AM, when he was noted to be lethargic and brady with HR down to 30. Code blue was subsequently called. At the time of arrival on scene, ACLS protocol was underway.   Technical Description:  - CPR performance duration:  1 minute  - Was defibrillation or cardioversion used? No   - Was external pacer placed? No  - Was patient intubated pre/post CPR? No   Medications Administered: Y = Yes; Blank = No Amiodarone    Atropine    Calcium    Epinephrine    Lidocaine    Magnesium    Norepinephrine    Phenylephrine    Sodium bicarbonate    Vasopressin     Post CPR evaluation:  - Final Status - Was patient successfully resuscitated ? Yes - What is current rhythm? NSR - What is current hemodynamic status? stable  Miscellaneous Information:  - Labs sent, including: CMP, CBC, Trop, Lactate  - Primary team notified?  Yes  - Family Notified? Per primary team  - Additional notes/ transfer status: PCCM consulted.      [name], MD  IMTS PGY-3 11/20/2020, 9:16 AM

## 2020-11-20 NOTE — ED Notes (Signed)
Called to room pt needs to have a bm. Pt placed on bedpan at this time

## 2020-11-20 NOTE — Progress Notes (Addendum)
Pt arrived to hemodialysis on BiPap and 5 mcg of nitro gtt.  Upon his arrival he was noted to be in stable condition with elevated troponins.  Following nursing assessment pt placed on hemodialysis as per MD orders.  At 0830, pt was noted to be unresponsive and having decelerations in his heart rate.  [place] MD at bedside as we aggressively gave a sternal rub and returned his blood. Pt responded well to sternal rubbing with heart rate increase and O2 sats in the 90's.  Hollie Salk MD requested we return pt to hemodialysis as he is very fluid overloaded.  At [place] pt once again became unresponsive, he began vomiting frank red blood, BiPap removed and nasal cannula placed and suctioning initiated.  Heart rate in the 30's back board in placed and chest compressions initiated.  Code called.  Code team arrived by 0850 and took over care of patient.

## 2020-11-20 NOTE — Consult Note (Addendum)
[facility] Consult: 10:33 AM 11/20/2020  LOS: 0 days    Referring Provider: Dr Karmen Bongo  Primary Care Physician:  Leonel Ramsay, MD in [facility] Primary Gastroenterologist: Althia Forts.  Earnie Larsson, MD, Charm Barges  MD at [facility].  Adrian Prows at [facility] in [place] most recently.    Reason for Consultation: Hematemesis.   HPI: [name] Colon is a 80 y.o. male.  PMH DM.  ESRD.  HD on MWF started in early 2022.  Anemia of chronic disease..  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  Hep C treated in 2014 with confirmed virologic cure.  Liver biopsy 2001 genotype 1b, stage III fibrosis.  Colon cancer.  Left colectomy 12/2004.  4 cycles of 5-FU chemotherapy.  Suspicion for peritoneal carcinomatosis 2008, however no obvious tumor recurrence.  Chronic idiopathic constipation.  Prostate cancer, cryoablation 01/2013 and bone scan negative for mets.  Jehovah's Witness, previous refusal of transfusion.  Appendectomy 2011.  Ureteral stent 2014.  Colonoscopies in 2015, 11/2018. Colonoscopy 11/2018.  Removal 2, 4 to 5 mm, polyps from transverse and ascending colon.  Patent colocolonic anastomosis with healthy mucosa.  Nonbleeding internal hemorrhoids.  Pathology: Fragments of TA x10.   03/2020 abdominal ultrasound showing cirrhosis, no liver lesions. 08/29/2020 EGD.  Normal esophagus.  Small HH.  Atrophic, erythematous, friable with contact mucosa in gastric body and antrum, biopsied for intestinal metaplasia mapping.  Duodenum normal.  Pathology: chronic, mild inactive gastritis, extensive/complete intestinal metaplasia no dysplasia at the antrum, incisura, lesser curvature, greater curvature.  Pathology from the cardia/fundus with gastric oxyntic mucosa, PPI effect but no intestinal metaplasia  or dysplasia. 10/10/2020 abdominal ultrasound: Cirrhotic liver.  No suspicious liver lesions.  Portal vein Dopplers normal.  Home medications include omeprazole 20 mg daily.  Pliant with this.  Does not use ASA or NSAIDs.  Seen at the ED 8/15 with complaint of chest pain beginning that morning.  Because of the pain he elected not to go to dialysis.  No shortness of breath.  Troponins 44, 45.  Pain subsided.  He was advised to go to the dialysis center but was unable to squeeze in to their schedule so dialysis was planned for 8/16.  However he did not have dialysis on 816 (not clear why ) and came back to the ED late last night complaining of shortness of breath, volume overloaded/pulmonary edema and pleural effusions on x-ray.  Treated with nonrebreather then BiPAP.  Initial BP 326 systolic.  Symptoms improved after nitro glycerin tablet.  Troponins 40, 80/102.  Cardiology recommends further cycling of troponins, echocardiogram and reevaluation after dialysis.  Patient began hemodialysis around 830 this morning but became unresponsive with heart rate to the 30s.[name]  He responded to sternal rubbing and heart rate increased to the 90s.  Patient again became unresponsive within 15 minutes and began vomiting frank blood.  BiPAP removed and suctioning initiated.  Heart rate returned back to the 30s and chest compressions initiated for 1 minute.  He was not defibrillated and did not receive any antiarrhythmic medications.  He  returned to sinus rhythm.  RN in dialysis as he finished at most 1 hour of dialysis before this was discontinued because of above events.  The amount of hematemesis was large and occurred only once.  No melena, no bloody stools.  Patient was out of it and he did not see the emesis.  He denies previous issues with nausea, vomiting.  Generally has a good appetite without difficulty swallowing or tolerating food.  Baseline bowel movements are 2-3 times a week.  No history recently and no current  abdominal pain.  No longer feeling nauseous.  Shortness of breath improved despite incomplete hemodialysis.  Protonix 40 mg IV bid ordered but has not received this yet.  Note he did get full-strength aspirin tablet at 420 this morning.  Hgb 8.2.  Was 9.5 two days ago, 8.4 yesterday.  Was 10 on 08/26/2020.  MCV 93.  Platelets 164. AFP 5 in late May 2022.  Currently living with his son and daughter-in-law.  Widowed.  Retired Astronomer at [employer].  No tobacco, no alcohol.  Does not drive.    Past Medical History:  Diagnosis Date   Renal disorder     History reviewed. No pertinent surgical history.  Prior to Admission medications   Medication Sig Start Date End Date Taking? Authorizing Provider  amLODipine (NORVASC) 10 MG tablet Take 10 mg by mouth daily. 11/16/20  Yes [provider]  Brinzolamide-Brimonidine (SIMBRINZA) 1-0.2 % SUSP Place 1 drop into the left eye 2 (two) times daily.   Yes [provider]  ketorolac (ACULAR) 0.4 % SOLN Place 1 drop into the left eye 4 (four) times daily. 11/05/20  Yes [provider]  multivitamin (RENA-VIT) TABS tablet Take 1 tablet by mouth daily. 11/16/20  Yes [provider]  ofloxacin (OCUFLOX) 0.3 % ophthalmic solution Place 1 drop into the left eye 4 (four) times daily. 11/04/20  Yes [provider]  omeprazole (PRILOSEC) 20 MG capsule Take 20 mg by mouth every evening. 10/29/20  Yes [provider]  prednisoLONE acetate (PRED FORTE) 1 % ophthalmic suspension Place 1 drop into the left eye 4 (four) times daily. 11/04/20  Yes [provider]  torsemide (DEMADEX) 20 MG tablet Take 20 mg by mouth daily. 11/16/20  Yes [provider]    Scheduled Meds:  Chlorhexidine Gluconate Cloth  6 each Topical Q0600   heparin  2,400 Units Dialysis Once in dialysis   heparin sodium (porcine)       pantoprazole (PROTONIX) IV  40 mg Intravenous Q12H   Infusions:  sodium chloride      sodium chloride     ampicillin-sulbactam (UNASYN) IV     nitroGLYCERIN 5 mcg/min (11/19/20 2351)   PRN Meds: sodium chloride, sodium chloride, alteplase, heparin, lidocaine (PF), lidocaine-prilocaine, pentafluoroprop-tetrafluoroeth   Allergies as of 11/19/2020   (No Known Allergies)    No family history on file.  Social History   Socioeconomic History   Marital status: Widowed    Spouse name: Not on file   Number of children: Not on file   Years of education: Not on file   Highest education level: Not on file  Occupational History   Not on file  Tobacco Use   Smoking status: Not on file   Smokeless tobacco: Not on file  Substance and Sexual Activity   Alcohol use: Not on file   Drug use: Not on file   Sexual activity: Not on file  Other Topics Concern   Not  on file  Social History Narrative   Not on file   Social Determinants of Health   Financial Resource Strain: Not on file  Food Insecurity: Not on file  Transportation Needs: Not on file  Physical Activity: Not on file  Stress: Not on file  Social Connections: Not on file  Intimate Partner Violence: Not on file    REVIEW OF SYSTEMS: Constitutional: Weakness, fatigue. ENT:  No nose bleeds Pulm: Shortness of breath as per HPI, improved. CV:  No palpitations, no LE edema.  Chest pain resolved currently. GU: Still makes some urine.  No hematuria, no frequency GI: See HPI. Heme: Denies excessive bleeding or bruising. Transfusions: None. Neuro:  No headaches, no peripheral tingling or numbness.  No history of seizures.  No history of syncope. Derm:  No itching, no rash or sores.  Endocrine:  No sweats or chills.  No polyuria or dysuria Immunization: Reviewed. Travel:  None beyond local counties in last few months.    PHYSICAL EXAM: Vital signs in last 24 hours: Vitals:   11/20/20 0945 11/20/20 1011  BP: (!) 148/59 (!) 159/60  Pulse: 65 68  Resp: 15 17  Temp:  97.6 F (36.4 C)  SpO2: 100% 100%   Wt  Readings from Last 3 Encounters:  11/19/20 78.9 kg    General: Patient is alert, comfortable.  Does not look ill Head: No signs of head trauma.  No facial asymmetry. Eyes: No conjunctival pallor. Ears: Not hard of hearing Nose: No discharge or congestion Mouth: Oral mucosa is moist, pink, clear.  There is no blood in the mouth.  Tongue is midline. Neck: No mass, no thyromegaly.  No JVD. Lungs: Some rales at the bases most prominent on the right.  No labored breathing.  No cough. Heart: RRR.  No MRG.  S1, S2 audible. Abdomen: Not tender.  Soft.  Active bowel sounds.  No HSM, masses, bruits, hernias.   Rectal: Deferred Musc/Skeltl: No joint redness, swelling or gross deformity. Extremities: No CCE. Neurologic: Appropriate.  Alert and oriented x3.  Fluid speech.  Moves all 4 limbs without tremor.  No obvious deficits or weakness. Skin: No sores, no rashes, no telangiectasia. Nodes: No cervical adenopathy Psych: Calm, cooperative, pleasant, fluid speech.  Intake/Output from previous day: No intake/output data recorded. Intake/Output this shift: No intake/output data recorded.  LAB RESULTS: Recent Labs    11/18/20 1146 11/19/20 2341 11/20/20 0006  WBC 9.7 12.6*  --   HGB 9.5* 8.4* 8.8*  HCT 28.6* 25.3* 26.0*  PLT 170 180  --    BMET Lab Results  Component Value Date   NA 138 11/20/2020   NA 140 11/20/2020   NA 137 11/19/2020   K 4.4 11/20/2020   K 4.0 11/20/2020   K 4.1 11/19/2020   CL 106 11/20/2020   CL 107 11/20/2020   CL 105 11/19/2020   CO2 23 11/20/2020   CO2 21 (L) 11/19/2020   CO2 23 11/18/2020   GLUCOSE 113 (H) 11/20/2020   GLUCOSE 166 (H) 11/20/2020   GLUCOSE 179 (H) 11/19/2020   BUN 78 (H) 11/20/2020   BUN 93 (H) 11/20/2020   BUN 100 (H) 11/19/2020   CREATININE 8.24 (H) 11/20/2020   CREATININE 11.40 (H) 11/20/2020   CREATININE 11.39 (H) 11/19/2020   CALCIUM 9.2 11/20/2020   CALCIUM 9.1 11/19/2020   CALCIUM 9.2 11/18/2020   LFT Recent Labs     11/18/20 1146 11/19/20 2341 11/20/20 0827  PROT 7.2 6.9  --  ALBUMIN 3.0* 3.0* 2.7*  AST 18 22  --   ALT 31 26  --   ALKPHOS 116 110  --   BILITOT 0.9 0.8  --    PT/INR No results found for: INR, PROTIME Hepatitis Panel No results for input(s): HEPBSAG, HCVAB, HEPAIGM, HEPBIGM in the last 72 hours. C-Diff No components found for: CDIFF Lipase  No results found for: LIPASE  Drugs of Abuse  No results found for: LABOPIA, COCAINSCRNUR, LABBENZ, AMPHETMU, THCU, LABBARB   RADIOLOGY STUDIES: DG Chest 2 View  Result Date: 11/18/2020 CLINICAL DATA:  LEFT side chest pain into LEFT arm beginning this morning EXAM: CHEST - 2 VIEW COMPARISON:  None FINDINGS: RIGHT jugular dual lumen central venous catheter with tip projecting over cavoatrial junction. Enlargement of cardiac silhouette with pulmonary vascular congestion. Atherosclerotic calcification aorta. Subsegmental atelectasis RIGHT base. No definite acute infiltrate or pneumothorax. Tiny pleural effusions blunt the posterior costophrenic angles, greater on RIGHT. No acute osseous findings. IMPRESSION: Enlargement of cardiac silhouette with pulmonary vascular congestion. RIGHT basilar atelectasis and tiny pleural effusions. Electronically Signed   By: Lavonia Dana M.D.   On: 11/18/2020 12:38   DG Chest Port 1 View  Result Date: 11/20/2020 CLINICAL DATA:  Chest pain short of breath EXAM: PORTABLE CHEST 1 VIEW COMPARISON:  11/18/2020 FINDINGS: Right-sided central venous catheter tip over the cavoatrial region. Increased small bilateral pleural effusions. Stable cardiomediastinal silhouette. Worsened vascular congestion and development of mild pulmonary edema. Aortic atherosclerosis. IMPRESSION: Increased vascular congestion and pulmonary edema compared to prior with development of small right greater than left pleural effusion Electronically Signed   By: Donavan Foil M.D.   On: 11/20/2020 00:00      IMPRESSION:      Single episode of  hematemesis.  No background issues with nausea or vomiting.  Compliant with omeprazole 20 mg daily.  Last EGD was 08/29/2020.  MD noted atrophic, friable with contact, erythematous mucosa in the stomach.  Pathology confirmed chronic, mild, inactive gastritis and extensive/complete intestinal metaplasia from biopsies throughout the stomach.  No evidence for portal hypertensive gastropathy and no varices on this study.     Cirrhosis of the liver.  History of hep C which has been eradicated as of 2014.  Patient does not drink.  Normocytic anemia.  Current Hb 8.2, 9.5 two d ago.  10 a couple of months ago.  Has anemia of chronic kidney disease.  Not clear if he receives Feraheme or colony-stimulating agents.  Elevated cardiac troponins.  Over the last 2 days has gone from 44, 45, 84 and 102 at 2 AM this morning.  Bradycardia.  Rapid response called and received about a minute of chest compressions at dialysis this morning.  No specific ischemia on EKG.  Cardiology made recommendations and cardiology consult is pending.     ESRD.  Missed dialysis on Monday.  Missed work in session on Tuesday and presented to hospital last night with volume overload.  Due to acute events and dialysis completed at most 60 minutes of dialysis session this morning.  Colon cancer.  Treated with left colectomy and 5-FU in 2006.  Some suspicion of peritoneal carcinoma ptosis in 2008 but [facility] notes there was no obvious tumor recurrence.  Underwent surveillance colonoscopy 11/2018 with removal of 2, subcentimeter tubular adenomatous polyps.      PLAN:       EGD, ? Timing.  Initiate Protonix 40 mg IV as ordered (dose yet to be given).  Ok to take po meds.  Ideally would do EGD after patient has finished a complete dialysis session as this will reduce risks of sedation in terms of cardiorespiratory issues.   [name] Colon  11/20/2020, 10:33 AM Phone [phone]    Attending Physician's Attestation   I have taken an interval  history, reviewed the chart and examined the patient.   80 y/o with ESRD on HD admitted with hypervolemia, dyspnea, chest pain after missing dialysis, became bradycardic and hypotensive in HD today, prompting Code Blue (although it seems unlikely the patient experienced a true cardiac arrest).  He feels much better now and is scheduled to complete HD this evening.  He apparently vomited bright red blood around the time of his code; the patient has no recollection.  He has been having worsening heartburn for a few days despite taking omeprazole, no other GI symptoms (abd pain, n/v, dysphagia).  He just had an EGD in May with GIM without dysplasia, no varices. His hgb has been essentially stable and he has not had any melenic stool.  I do not suspect the patient had a significant upper GI bleed, and am more suspicious for an oozing etiology such as esophagitis or gastritis.  I discussed the role of upper endoscopy in this situation to the patient and his daughter/son-in-law and didn't think intervention was warranted.  A diagnostic scope could be performed to assess risk of further bleeding. As the patient is Jehovah's witness and would not want a blood transfusion, he would prefer to be more aggressive about identifying the etiology of the hematemesis.  I agree to perform the endoscopy, but want to make sure he is more euvolemic and that there are no new cardiac concerns before subjecting him to a sedated procedure.  Await cardiology assessment before proceeding to endoscopy.  I am okay with him advancing his diet tonight for dinner with plans for NPO after midnight in the event of possible EGD tomorrow.   Will add carafate QID to help with heartburn symptoms. Continue PPI.  I agree with the Advanced Practitioner's note, impression, and recommendations with updates and my documentation above.   Dustin Flock, MD [facility]

## 2020-11-20 NOTE — H&P (Signed)
History and Physical    [name] Colon ASN:[id number] DOB: 1941-02-27 DOA: 11/19/2020  PCP: Leonel Ramsay, MD Consultants:  Bettey Costa - vascular;  Patient coming from:  Home - lives with son and DIL; NOK: Significant other, [name] Colon, [phone]  Chief Complaint: Chest pain  HPI: [name] Colon is a 80 y.o. male with medical history significant of colon cancer; ESRD on HD; Hep C-associated cirrhosis; and HTN presenting with chest pain in the setting of missed HD.  He reports periodic CP for the last several days and he took ASA for this.    He was seen after code blue was called while he was in HD: 2 episodes in HD while on BIPAP, unresponsive, brady into 30s, ?vagal.  +hematemesis.  PCCM and Rapid Response responded.  Off BIPAP and A&O x 3 on 3L [place] O2.  Will go to progressive under TRH.  HD stopped for now, but volume overloaded and so will need later today.  PCCM has evaluated.  Repeat labs ordered, Echo ordered, will need GI.  Going to [place].  BP 156/66, NSR now in 50-60s, 100% on [place] O2.  Started on Unasyn and IV Protonix BID.  At the time of my evaluation, he reported feeling fatigued but otherwise no specific complaints.  He has had epigastric discomfort recently.  He denies use of NSAIDs.  He took ASA for CP, as above; otherwise he takes Tylenol.  ED Course: Carryover, per Dr. Cyd Silence:  Presented to the emergency department the day before with chest discomfort and was discharged home after several troponins were obtained.  Patient returned tonight with severe shortness of breath on arrival with chest x-ray revealing substantial pulmonary edema with right greater than left pleural effusions.  Patient was placed on BiPAP and has much improved symptoms.  ER provider discussed case with nephrology who can dialyze patient in the morning so we will keep on dialysis in the meantime.  Patient has slightly elevated troponins at 40, 80 and 102.  ER provider discussed case with cardiology.   They recommended cycling troponins, getting an echocardiogram, reevaluating after dialysis and consulting cardiology during the day if necessary  Review of Systems: As per HPI; otherwise review of systems reviewed and negative.    COVID Vaccine Status:  Complete  Past Medical History:  Diagnosis Date   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[place])    Colon cancer ([place])    ESRD (end stage renal disease) on dialysis ([place])    Hypertension     History reviewed. No pertinent surgical history.  Social History   Socioeconomic History   Marital status: Widowed    Spouse name: Not on file   Number of children: Not on file   Years of education: Not on file   Highest education level: Not on file  Occupational History   Not on file  Tobacco Use   Smoking status: Unknown   Smokeless tobacco: Not on file  Substance and Sexual Activity   Alcohol use: Not Currently   Drug use: Not Currently   Sexual activity: Not on file  Other Topics Concern   Not on file  Social History Narrative   Not on file   Social Determinants of Health   Financial Resource Strain: Not on file  Food Insecurity: Not on file  Transportation Needs: Not on file  Physical Activity: Not on file  Stress: Not on file  Social Connections: Not on file  Intimate Partner Violence: Not on file    No Known Allergies  History reviewed. No pertinent family history.  Prior to Admission medications   Medication Sig Start Date End Date Taking? Authorizing Provider  amLODipine (NORVASC) 10 MG tablet Take 10 mg by mouth daily. 11/16/20  Yes [provider]  Brinzolamide-Brimonidine (SIMBRINZA) 1-0.2 % SUSP Place 1 drop into the left eye 2 (two) times daily.   Yes [provider]  ketorolac (ACULAR) 0.4 % SOLN Place 1 drop into the left eye 4 (four) times daily. 11/05/20  Yes [provider]  multivitamin (RENA-VIT) TABS tablet Take 1 tablet by mouth daily. 11/16/20  Yes [provider]   ofloxacin (OCUFLOX) 0.3 % ophthalmic solution Place 1 drop into the left eye 4 (four) times daily. 11/04/20  Yes [provider]  omeprazole (PRILOSEC) 20 MG capsule Take 20 mg by mouth every evening. 10/29/20  Yes [provider]  prednisoLONE acetate (PRED FORTE) 1 % ophthalmic suspension Place 1 drop into the left eye 4 (four) times daily. 11/04/20  Yes [provider]  torsemide (DEMADEX) 20 MG tablet Take 20 mg by mouth daily. 11/16/20  Yes [provider]    Physical Exam: Vitals:   11/20/20 1011 11/20/20 1300 11/20/20 1400 11/20/20 1600  BP: (!) 159/60 (!) 152/65 (!) 146/59 (!) 159/66  Pulse: 68 70  68  Resp: 17 (!) 21 20 19   Temp: 97.6 F (36.4 C) 98 F (36.7 C)  97.8 F (36.6 C)  TempSrc: Oral Oral  Oral  SpO2: 100% 97%  98%  Weight:      Height:         General:  Appears calm and comfortable and is in NAD, appears fatigued Eyes:  EOMI, normal lids, iris ENT:  grossly normal hearing, lips & tongue, mmm Neck:  no LAD, masses or thyromegaly Cardiovascular:  RRR, 1-0/9 systolic murmur best heard at the apex. No LE edema.  Respiratory:   CTA bilaterally with no wheezes/rales/rhonchi.  Normal respiratory effort. Abdomen:  soft, NT, ND Skin:  no rash or induration seen on limited exam Musculoskeletal:  grossly normal tone BUE/BLE, good ROM, no bony abnormality Psychiatric:  blunted mood and affect, speech fluent and appropriate, AOx3 Neurologic:  CN 2-12 grossly intact, moves all extremities in coordinated fashion    Radiological Exams on Admission: Independently reviewed - see discussion in A/P where applicable  DG Chest Port 1 View  Result Date: 11/20/2020 CLINICAL DATA:  Chest pain short of breath EXAM: PORTABLE CHEST 1 VIEW COMPARISON:  11/18/2020 FINDINGS: Right-sided central venous catheter tip over the cavoatrial region. Increased small bilateral pleural effusions. Stable cardiomediastinal silhouette. Worsened vascular congestion and  development of mild pulmonary edema. Aortic atherosclerosis. IMPRESSION: Increased vascular congestion and pulmonary edema compared to prior with development of small right greater than left pleural effusion Electronically Signed   By: Donavan Foil M.D.   On: 11/20/2020 00:00   ECHOCARDIOGRAM COMPLETE  Result Date: 11/20/2020    ECHOCARDIOGRAM REPORT   Patient Name:   ENGLISH CRAIGHEAD Date of Exam: 11/20/2020 Medical Rec #:  [id number]        Height:       68.0 in Accession #:    [id number]       Weight:       174.0 lb Date of Birth:  1940/09/24         BSA:          1.926 m Patient Age:    89 years         BP:  159/60 mmHg Patient Gender: M                HR:           71 bpm. Exam Location:  Inpatient Procedure: 2D Echo, Cardiac Doppler and Color Doppler                          STAT ECHO Reported to: Dr Feliberto Gottron on 11/20/2020 11:08:00 AM. Indications:    Dyspnea R06.00  History:        Patient has no prior history of Echocardiogram examinations.                 Renal Disorder.  Sonographer:    Tiffany Dance RVT Referring Phys: [place]  1. Endocardial border tracking is poor resulting in inaccurate automated LVEF assessment. Mild, global hypokinesis. Left ventricular ejection fraction, by estimation, is 45 to 50%. The left ventricle has mildly decreased function. The left ventricle demonstrates global hypokinesis. Left ventricular diastolic parameters are consistent with Grade I diastolic dysfunction (impaired relaxation). Elevated left ventricular end-diastolic pressure.  2. Right ventricular systolic function is normal. The right ventricular size is normal. There is moderately elevated pulmonary artery systolic pressure.  3. Left atrial size was moderately dilated.  4. The mitral valve is normal in structure. Mild mitral valve regurgitation. No evidence of mitral stenosis.  5. The aortic valve is tricuspid. There is mild calcification of the aortic valve. There is mild  thickening of the aortic valve. Aortic valve regurgitation is mild. No aortic stenosis is present.  6. Aortic dilatation noted. There is borderline dilatation of the aortic root, measuring 36 mm. There is mild dilatation of the ascending aorta, measuring 37 mm.  7. The inferior vena cava is normal in size with greater than 50% respiratory variability, suggesting right atrial pressure of 3 mmHg. FINDINGS  Left Ventricle: Endocardial border tracking is poor resulting in inaccurate automated LVEF assessment. Mild, global hypokinesis. Left ventricular ejection fraction, by estimation, is 45 to 50%. The left ventricle has mildly decreased function. The left ventricle demonstrates global hypokinesis. The left ventricular internal cavity size was normal in size. There is no left ventricular hypertrophy. Left ventricular diastolic parameters are consistent with Grade I diastolic dysfunction (impaired relaxation). Elevated left ventricular end-diastolic pressure. Right Ventricle: The right ventricular size is normal. No increase in right ventricular wall thickness. Right ventricular systolic function is normal. There is moderately elevated pulmonary artery systolic pressure. The tricuspid regurgitant velocity is 3.39 m/s, and with an assumed right atrial pressure of 3 mmHg, the estimated right ventricular systolic pressure is 34.1 mmHg. Left Atrium: Left atrial size was moderately dilated. Right Atrium: Right atrial size was normal in size. Pericardium: There is no evidence of pericardial effusion. Mitral Valve: The mitral valve is normal in structure. Mild mitral valve regurgitation. No evidence of mitral valve stenosis. Tricuspid Valve: The tricuspid valve is normal in structure. Tricuspid valve regurgitation is trivial. No evidence of tricuspid stenosis. Aortic Valve: The aortic valve is tricuspid. There is mild calcification of the aortic valve. There is mild thickening of the aortic valve. Aortic valve regurgitation is  mild. No aortic stenosis is present. Pulmonic Valve: The pulmonic valve was normal in structure. Pulmonic valve regurgitation is not visualized. No evidence of pulmonic stenosis. Aorta: Aortic dilatation noted. There is borderline dilatation of the aortic root, measuring 36 mm. There is mild dilatation of the ascending aorta, measuring 37 mm. Venous: The  inferior vena cava is normal in size with greater than 50% respiratory variability, suggesting right atrial pressure of 3 mmHg. IAS/Shunts: No atrial level shunt detected by color flow Doppler.  LEFT VENTRICLE PLAX 2D LVIDd:         4.60 cm  Diastology LVIDs:         3.50 cm  LV e' medial:    6.42 cm/s LV PW:         1.30 cm  LV E/e' medial:  18.7 LV IVS:        1.00 cm  LV e' lateral:   7.18 cm/s LVOT diam:     2.00 cm  LV E/e' lateral: 16.7 LV SV:         75 LV SV Index:   39 LVOT Area:     3.14 cm  RIGHT VENTRICLE             IVC RV Basal diam:  3.70 cm     IVC diam: 1.80 cm RV Mid diam:    2.40 cm RV S prime:     17.40 cm/s TAPSE (M-mode): 2.1 cm LEFT ATRIUM             Index       RIGHT ATRIUM           Index LA diam:        3.60 cm 1.87 cm/m  RA Area:     18.10 cm LA Vol (A2C):   89.1 ml 46.25 ml/m RA Volume:   52.50 ml  27.25 ml/m LA Vol (A4C):   56.5 ml 29.33 ml/m LA Biplane Vol: 72.2 ml 37.48 ml/m  AORTIC VALVE LVOT Vmax:   121.00 cm/s LVOT Vmean:  78.500 cm/s LVOT VTI:    0.238 m  AORTA Ao Root diam: 3.60 cm Ao Asc diam:  3.70 cm MITRAL VALVE                TRICUSPID VALVE MV Area (PHT): 3.70 cm     TR Peak grad:   46.0 mmHg MV Decel Time: 205 msec     TR Vmax:        339.00 cm/s MV E velocity: 120.00 cm/s MV A velocity: 108.00 cm/s  SHUNTS MV E/A ratio:  1.11         Systemic VTI:  0.24 m                             Systemic Diam: 2.00 cm Skeet Latch MD Electronically signed by Skeet Latch MD Signature Date/Time: 11/20/2020/11:36:18 AM    Final     EKG: Independently reviewed.  NSR with rate 88; nonspecific ST changes with no evidence  of acute ischemia   Labs on Admission: I have personally reviewed the available labs and imaging studies at the time of the admission.  Pertinent labs:   Glucose 179 BUN 100/Creatinine 11.39/GFR 4 HS troponin 44, 45, 84, 102 WBC 12.6 Hgb 9.5 -> 8.4 -> 8.2 -> 7.4 COVID/flu negative   Assessment/Plan Principal Problem:   Volume overload Active Problems:   ESRD (end stage renal disease) on dialysis (HCC)   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HCC)   Hypertension   Vasovagal episode   Hematemesis   Acute on chronic combined systolic and diastolic CHF (congestive heart failure) (HCC)   Volume overload in an ESRD on HD patient -Patient previously seen in the ER on 8/15 for CP; he was discharged  with outpatient f/u -He returned last night with the same - CP/SOB/weakness after missed HD -He was acutely SOB and placed on BIPAP and NTG drip and planned for urgent HD -He was taken to HD and had recurrent unresponsive episodes while there (see below) -He is now stabilized and admitted to progressive care, but will need HD again due to persistent volume overload -Certainly, he needs fluid restriction and very low salt intake on an ongoing basis as well as HD compliance -Patient on chronic MWF HD -Nephrology prn order set utilized  Vasovagal episodes in HD -He had at least 2 episodes of unresponsiveness while in HD, on BIPAP; he dropped HR to 30s and code blue was called -He had an episode of hematemesis and BIPAP was removed -CPR was initiated but quickly stopped after a few chest compressions when the patient awakened -HD was stopped and he was transferred to [facility] -STAT echo was ordered -Code team appreciated -Possible concern for aspiration so started on Unasyn   Acute on chronic combined CHF -Echo was performed post-event and showed EF 45-50% and grade 1 diastolic dysfunction -Troponin was mildly uptrending prior to event, likely to be worse now -Cardiology was consulted, consult  pending  Hematemesis -Single episode occurring at/around the time of aforementioned vasovagal episode -Possibly related to chest compressions, but unlikely Boerhaave syndrome since no further episodes have occurred and he appears to be hemodynamically stable -Also with h/o cirrhosis but variceal bleeding appears unlikely for the same reason -Mallory-Weiss tear appears most likely at this time -Will consult GI -Trend CBC -He is Jehovah's Witness and refuses blood products, which clearly complicates this situation -Will start IV Protonix -Likely needs EGD for further evaluation  HTN -Marked HTN on arrival in the ER and placed on NTG drip -He then had the episode described above and appears to have stabilized -Hold Norvasc for now pending cards input -Also likely to have further BP improvement with HD  L eye issue -Continue home drops - ketorolac, ofloxacin, prednisolone, Simbrinza (formulary substitution)  Hep C-associated cirrhosis -Remote, no longer followed due to "cure" in 2014  H/o colon/prostate cancer -Remote, stable    Note: This patient has been tested and is negative for the novel coronavirus COVID-19. The patient has been fully vaccinated against COVID-19.   Level of care: Progressive DVT prophylaxis: SCDs Code Status:  Full - confirmed with patient Family Communication: None present; GI spoke with family regarding possible need for EGD Disposition Plan:  The patient is from: home  Anticipated d/c is to: home without [facility] services   Anticipated d/c date will depend on clinical response to treatment, likely several days  Patient is currently: acutely ill Consults called: PCCM; Nephrology; GI; Cardiology  Admission status:  Admit - It is my clinical opinion that admission to INPATIENT is reasonable and necessary because of the expectation that this patient will require hospital care that crosses at least 2 midnights to treat this condition based on the medical  complexity of the problems presented.  Given the aforementioned information, the predictability of an adverse outcome is felt to be significant.    Karmen Bongo MD [facility]   How to contact the [facility] Attending or Consulting provider [place] or covering provider during after hours [place], for this patient?  Check the care team in [facility] and look for a) attending/consulting [facility] provider listed and b) the [facility] team listed Log into [email or web address] and use [place]'s universal password to access. If you do not  have the password, please contact the hospital operator. Locate the [facility] provider you are looking for under [facility] and page to a number that you can be directly reached. If you still have difficulty reaching the provider, please page the [facility] (Director on Call) for the Hospitalists listed on amion for assistance.   11/20/2020, 5:55 PM

## 2020-11-20 NOTE — Progress Notes (Signed)
RT assisted with transportation of this pt from ED22 to hemodialysis while on BiPAP. Pt tolerated well with no complications. RT gave report to RT covering that floor.

## 2020-11-21 ENCOUNTER — Encounter (HOSPITAL_COMMUNITY): Payer: Self-pay | Admitting: Internal Medicine

## 2020-11-21 DIAGNOSIS — I5043 Acute on chronic combined systolic (congestive) and diastolic (congestive) heart failure: Secondary | ICD-10-CM

## 2020-11-21 DIAGNOSIS — J810 Acute pulmonary edema: Secondary | ICD-10-CM | POA: Diagnosis not present

## 2020-11-21 LAB — CBC
HCT: 22.2 % — ABNORMAL LOW (ref 39.0–52.0)
Hemoglobin: 7.8 g/dL — ABNORMAL LOW (ref 13.0–17.0)
MCH: 31.8 pg (ref 26.0–34.0)
MCHC: 35.1 g/dL (ref 30.0–36.0)
MCV: 90.6 fL (ref 80.0–100.0)
Platelets: 143 10*3/uL — ABNORMAL LOW (ref 150–400)
RBC: 2.45 MIL/uL — ABNORMAL LOW (ref 4.22–5.81)
RDW: 14.8 % (ref 11.5–15.5)
WBC: 11.3 10*3/uL — ABNORMAL HIGH (ref 4.0–10.5)
nRBC: 0 % (ref 0.0–0.2)

## 2020-11-21 LAB — BASIC METABOLIC PANEL
Anion gap: 12 (ref 5–15)
BUN: 57 mg/dL — ABNORMAL HIGH (ref 8–23)
CO2: 27 mmol/L (ref 22–32)
Calcium: 9 mg/dL (ref 8.9–10.3)
Chloride: 99 mmol/L (ref 98–111)
Creatinine, Ser: 7.72 mg/dL — ABNORMAL HIGH (ref 0.61–1.24)
GFR, Estimated: 6 mL/min — ABNORMAL LOW (ref >60)
Glucose, Bld: 82 mg/dL (ref 70–99)
Potassium: 4.1 mmol/L (ref 3.5–5.1)
Sodium: 138 mmol/L (ref 135–145)

## 2020-11-21 LAB — HEPATITIS B SURFACE ANTIBODY, QUANTITATIVE: Hep B S AB Quant (Post): 237.1 m[IU]/mL (ref >9.9)

## 2020-11-21 MED ORDER — SUCRALFATE 1 GM/10ML PO SUSP
1.0000 g | Freq: Three times a day (TID) | ORAL | Status: DC
  Administered 2020-11-21 – 2020-11-26 (×15): 1 g via ORAL
  Filled 2020-11-21 (×15): qty 10

## 2020-11-21 MED ORDER — TORSEMIDE 20 MG PO TABS
10.0000 mg | ORAL_TABLET | Freq: Every day | ORAL | Status: DC
  Administered 2020-11-21 – 2020-12-03 (×10): 10 mg via ORAL
  Filled 2020-11-21 (×11): qty 1

## 2020-11-21 MED ORDER — FERRIC CITRATE 1 GM 210 MG(FE) PO TABS
420.0000 mg | ORAL_TABLET | Freq: Two times a day (BID) | ORAL | Status: DC
  Administered 2020-11-21 – 2020-12-03 (×19): 420 mg via ORAL
  Filled 2020-11-21 (×19): qty 2

## 2020-11-21 NOTE — Progress Notes (Signed)
   11/21/20 2100  Assess: MEWS Score  ECG Heart Rate 81  Resp (!) 27  Assess: MEWS Score  MEWS Temp 0  MEWS Systolic 0  MEWS Pulse 0  MEWS RR 2  MEWS LOC 0  MEWS Score 2  MEWS Score Color Yellow  Assess: if the MEWS score is Yellow or Red  Were vital signs taken at a resting state? Yes  Focused Assessment No change from prior assessment  Early Detection of Sepsis Score *See Row Information* Low  MEWS guidelines implemented *See Row Information* Yes  Treat  MEWS Interventions Escalated (See documentation below)  Take Vital Signs  Increase Vital Sign Frequency  Yellow: Q 2hr X 2 then Q 4hr X 2, if remains yellow, continue Q 4hrs  Escalate  MEWS: Escalate Yellow: discuss with charge nurse/RN and consider discussing with provider and RRT  Notify: Charge Nurse/RN  Name of Charge Nurse/RN Notified [place], RN  Date Charge Nurse/RN Notified 11/21/20  Time Charge Nurse/RN Notified 2100  Document  Patient Outcome Other (Comment) (stable)  Progress note created (see row info) Yes

## 2020-11-21 NOTE — Progress Notes (Signed)
[facility] Progress Note   Assessment/ Plan:   1 SOB/ respiratory distress: missed HD on Monday- likely volume overload.  Possbily aspirated during vomiting episode on BiPaP so on Unasyn too.  Improved yesterday after HD 2.  Suspected UGIB: no documented varices 03/2020 in [facility]'s system.  GI consulting--> plan for EGD tomorrow (pt ate today).  Pt is a Jehovah's witness--> does NOT want blood products.  Would use caution with carafate in ESRD. 3.  Chest pain/ vasovagal syncope- cardiology c/s, appreciate assistance.  Interestingly, pt was prescribed Simbriza eye gtts from a cataract surgery last week--> Has A2 agonist properties so ? 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  HD tomorrow on schedule 5 Hypertension: not on any BP meds as OP per chart- pt's dtr says he's on amlodipine 10 mg daily.  On torsemide 20 mg on non-dialysis days. 6 Anemia of ESRD:  not due for mircera yet- anticipate he will need an early dose 7. Metabolic Bone Disease: calcitriol with rx, Auryxia as binder 8.  Hep C cirrhosis- follows at [facility], cured 2014 per their notes 9.  H/o prostate cancer 10: h/o colon cancer 11: Dispo: SDU  Subjective:    Seen in room. Dialysis yesterday with 2.5L off after episode of unresponsiveness- did well.  Looks excellent.  Dtr and son-in-law at bedside- updated, questions answered.     Objective:   BP (!) 134/57 (BP Location: Right Arm)   Pulse 80   Temp 97.9 F (36.6 C) (Oral)   Resp 17   Ht 5\' 8"  (1.727 m)   Wt 76.5 kg   SpO2 100%   BMI 25.64 kg/m   Physical Exam: GEN sitting in bed, off O2, appears well HEENT EOMI PERRL NECK no JVD PULM clear anteriorly, faint posterior bibasilar crackles CV RRR ABD soft, mildly distended, improved EXT no real LE edema NEURO AAO x 3 nonfocal ACCESS: R [facility]  Labs: BMET Recent Labs  Lab 11/18/20 1146 11/19/20 2341 11/20/20 0006 11/20/20 0827 11/21/20 0441  NA 139 137 140  138 138  K 4.4 4.1 4.0 4.4 4.1  CL 104 105 107 106 99  CO2 23 21*  --  23 27  GLUCOSE 97 179* 166* 113* 82  BUN 79* 100* 93* 78* 57*  CREATININE 9.78* 11.39* 11.40* 8.24* 7.72*  CALCIUM 9.2 9.1  --  9.2 9.0  PHOS  --   --   --  3.5  --    CBC Recent Labs  Lab 11/18/20 1146 11/19/20 2341 11/20/20 0006 11/20/20 1018 11/20/20 1455 11/21/20 0441  WBC 9.7 12.6*  --  12.3* 11.6* 11.3*  NEUTROABS 7.1 10.5*  --   --   --   --   HGB 9.5* 8.4* 8.8* 8.2* 7.4* 7.8*  HCT 28.6* 25.3* 26.0* 24.3* 22.3* 22.2*  MCV 92.6 93.7  --  93.1 92.9 90.6  PLT 170 180  --  164 166 143*      Medications:     brinzolamide  1 drop Left Eye BID   And   brimonidine  1 drop Left Eye BID   Chlorhexidine Gluconate Cloth  6 each Topical Q0600   ketorolac  1 drop Left Eye QID   multivitamin  1 tablet Oral Daily   ofloxacin  1 drop Left Eye QID   pantoprazole (PROTONIX) IV  40 mg Intravenous Q12H   prednisoLONE acetate  1 drop Left Eye QID   sodium chloride  flush  3 mL Intravenous Q12H   sucralfate  1 g Oral TID WC & HS   torsemide  10 mg Oral Daily     Madelon Lips MD 11/21/2020, 11:09 AM

## 2020-11-21 NOTE — Progress Notes (Signed)
Progress Note  Patient Name: [name] Colon Date of Encounter: 11/21/2020  [facility] Cardiologist: None   Subjective   Feels well.  Had a good dialysis session yesterday.  No further vasovagal type episodes.  Daughter in room.  Discussed with Dr. Broadus John as well.  Inpatient Medications    Scheduled Meds:  brinzolamide  1 drop Left Eye BID   And   brimonidine  1 drop Left Eye BID   Chlorhexidine Gluconate Cloth  6 each Topical Q0600   ketorolac  1 drop Left Eye QID   multivitamin  1 tablet Oral Daily   ofloxacin  1 drop Left Eye QID   pantoprazole (PROTONIX) IV  40 mg Intravenous Q12H   prednisoLONE acetate  1 drop Left Eye QID   sodium chloride flush  3 mL Intravenous Q12H   sucralfate  1 g Oral TID WC & HS   Continuous Infusions:  ampicillin-sulbactam (UNASYN) IV     nitroGLYCERIN Stopped (11/21/20 0700)   PRN Meds: acetaminophen **OR** acetaminophen, calcium carbonate (dosed in mg elemental calcium), camphor-menthol **AND** hydrOXYzine, docusate sodium, feeding supplement (NEPRO CARB STEADY), hydrALAZINE, lidocaine (PF), lidocaine-prilocaine, ondansetron **OR** ondansetron (ZOFRAN) IV, pentafluoroprop-tetrafluoroeth, sorbitol, zolpidem   Vital Signs    Vitals:   11/20/20 2206 11/21/20 0024 11/21/20 0300 11/21/20 0749  BP: 122/60 136/62 (!) 135/58 (!) 134/57  Pulse: 84 70  80  Resp: 15 20 17 17   Temp: 98.2 F (36.8 C) 98 F (36.7 C) 98.6 F (37 C) 97.9 F (36.6 C)  TempSrc:  Oral Oral Oral  SpO2: 98% 99%  100%  Weight: 76.5 kg     Height:        Intake/Output Summary (Last 24 hours) at 11/21/2020 0825 Last data filed at 11/20/2020 2213 Gross per 24 hour  Intake 31.68 ml  Output 2500 ml  Net -2468.32 ml   Last 3 Weights 11/20/2020 11/20/2020 11/19/2020  Weight (lbs) 168 lb 10.4 oz (No Data) 174 lb  Weight (kg) 76.5 kg (No Data) 78.926 kg      Telemetry    Sinus rhythm- Personally Reviewed  ECG    No new- Personally Reviewed  Physical Exam    GEN: No acute distress.   Neck: No JVD, dialysis catheter noted Cardiac: RRR, 1/6 systolic murmur, no rubs, or gallops.  Respiratory: Clear to auscultation bilaterally. GI: Soft, nontender, non-distended  MS: No edema; No deformity. Neuro:  Nonfocal  Psych: Normal affect   Labs    High Sensitivity Troponin:   Recent Labs  Lab 11/18/20 1146 11/18/20 1520 11/19/20 2341 11/20/20 0155  TROPONINIHS 44* 45* 84* 102*      Chemistry Recent Labs  Lab 11/18/20 1146 11/19/20 2341 11/20/20 0006 11/20/20 0827 11/21/20 0441  NA 139 137 140 138 138  K 4.4 4.1 4.0 4.4 4.1  CL 104 105 107 106 99  CO2 23 21*  --  23 27  GLUCOSE 97 179* 166* 113* 82  BUN 79* 100* 93* 78* 57*  CREATININE 9.78* 11.39* 11.40* 8.24* 7.72*  CALCIUM 9.2 9.1  --  9.2 9.0  PROT 7.2 6.9  --   --   --   ALBUMIN 3.0* 3.0*  --  2.7*  --   AST 18 22  --   --   --   ALT 31 26  --   --   --   ALKPHOS 116 110  --   --   --   BILITOT 0.9 0.8  --   --   --  GFRNONAA 5* 4*  --  6* 6*  ANIONGAP 12 11  --  9 12     Hematology Recent Labs  Lab 11/20/20 1018 11/20/20 1455 11/21/20 0441  WBC 12.3* 11.6* 11.3*  RBC 2.61* 2.40* 2.45*  HGB 8.2* 7.4* 7.8*  HCT 24.3* 22.3* 22.2*  MCV 93.1 92.9 90.6  MCH 31.4 30.8 31.8  MCHC 33.7 33.2 35.1  RDW 14.7 14.6 14.8  PLT 164 166 143*    BNPNo results for input(s): BNP, PROBNP in the last 168 hours.   DDimer No results for input(s): DDIMER in the last 168 hours.   Radiology    DG Chest Port 1 View  Result Date: 11/20/2020 CLINICAL DATA:  Chest pain short of breath EXAM: PORTABLE CHEST 1 VIEW COMPARISON:  11/18/2020 FINDINGS: Right-sided central venous catheter tip over the cavoatrial region. Increased small bilateral pleural effusions. Stable cardiomediastinal silhouette. Worsened vascular congestion and development of mild pulmonary edema. Aortic atherosclerosis. IMPRESSION: Increased vascular congestion and pulmonary edema compared to prior with development of  small right greater than left pleural effusion Electronically Signed   By: Donavan Foil M.D.   On: 11/20/2020 00:00   ECHOCARDIOGRAM COMPLETE  Result Date: 11/20/2020    ECHOCARDIOGRAM REPORT   Patient Name:   [name] Colon Date of Exam: 11/20/2020 Medical Rec #:  [id number]        Height:       68.0 in Accession #:    [id number]       Weight:       174.0 lb Date of Birth:  1941/02/23         BSA:          1.926 m Patient Age:    80 years         BP:           159/60 mmHg Patient Gender: M                HR:           71 bpm. Exam Location:  Inpatient Procedure: 2D Echo, Cardiac Doppler and Color Doppler                          STAT ECHO Reported to: Dr Feliberto Gottron on 11/20/2020 11:08:00 AM. Indications:    Dyspnea R06.00  History:        Patient has no prior history of Echocardiogram examinations.                 Renal Disorder.  Sonographer:    Tiffany Dance RVT Referring Phys: [place]  1. Endocardial border tracking is poor resulting in inaccurate automated LVEF assessment. Mild, global hypokinesis. Left ventricular ejection fraction, by estimation, is 45 to 50%. The left ventricle has mildly decreased function. The left ventricle demonstrates global hypokinesis. Left ventricular diastolic parameters are consistent with Grade I diastolic dysfunction (impaired relaxation). Elevated left ventricular end-diastolic pressure.  2. Right ventricular systolic function is normal. The right ventricular size is normal. There is moderately elevated pulmonary artery systolic pressure.  3. Left atrial size was moderately dilated.  4. The mitral valve is normal in structure. Mild mitral valve regurgitation. No evidence of mitral stenosis.  5. The aortic valve is tricuspid. There is mild calcification of the aortic valve. There is mild thickening of the aortic valve. Aortic valve regurgitation is mild. No aortic stenosis is present.  6. Aortic dilatation noted. There is borderline dilatation  of  the aortic root, measuring 36 mm. There is mild dilatation of the ascending aorta, measuring 37 mm.  7. The inferior vena cava is normal in size with greater than 50% respiratory variability, suggesting right atrial pressure of 3 mmHg. FINDINGS  Left Ventricle: Endocardial border tracking is poor resulting in inaccurate automated LVEF assessment. Mild, global hypokinesis. Left ventricular ejection fraction, by estimation, is 45 to 50%. The left ventricle has mildly decreased function. The left ventricle demonstrates global hypokinesis. The left ventricular internal cavity size was normal in size. There is no left ventricular hypertrophy. Left ventricular diastolic parameters are consistent with Grade I diastolic dysfunction (impaired relaxation). Elevated left ventricular end-diastolic pressure. Right Ventricle: The right ventricular size is normal. No increase in right ventricular wall thickness. Right ventricular systolic function is normal. There is moderately elevated pulmonary artery systolic pressure. The tricuspid regurgitant velocity is 3.39 m/s, and with an assumed right atrial pressure of 3 mmHg, the estimated right ventricular systolic pressure is 15.1 mmHg. Left Atrium: Left atrial size was moderately dilated. Right Atrium: Right atrial size was normal in size. Pericardium: There is no evidence of pericardial effusion. Mitral Valve: The mitral valve is normal in structure. Mild mitral valve regurgitation. No evidence of mitral valve stenosis. Tricuspid Valve: The tricuspid valve is normal in structure. Tricuspid valve regurgitation is trivial. No evidence of tricuspid stenosis. Aortic Valve: The aortic valve is tricuspid. There is mild calcification of the aortic valve. There is mild thickening of the aortic valve. Aortic valve regurgitation is mild. No aortic stenosis is present. Pulmonic Valve: The pulmonic valve was normal in structure. Pulmonic valve regurgitation is not visualized. No evidence of  pulmonic stenosis. Aorta: Aortic dilatation noted. There is borderline dilatation of the aortic root, measuring 36 mm. There is mild dilatation of the ascending aorta, measuring 37 mm. Venous: The inferior vena cava is normal in size with greater than 50% respiratory variability, suggesting right atrial pressure of 3 mmHg. IAS/Shunts: No atrial level shunt detected by color flow Doppler.  LEFT VENTRICLE PLAX 2D LVIDd:         4.60 cm  Diastology LVIDs:         3.50 cm  LV e' medial:    6.42 cm/s LV PW:         1.30 cm  LV E/e' medial:  18.7 LV IVS:        1.00 cm  LV e' lateral:   7.18 cm/s LVOT diam:     2.00 cm  LV E/e' lateral: 16.7 LV SV:         75 LV SV Index:   39 LVOT Area:     3.14 cm  RIGHT VENTRICLE             IVC RV Basal diam:  3.70 cm     IVC diam: 1.80 cm RV Mid diam:    2.40 cm RV S prime:     17.40 cm/s TAPSE (M-mode): 2.1 cm LEFT ATRIUM             Index       RIGHT ATRIUM           Index LA diam:        3.60 cm 1.87 cm/m  RA Area:     18.10 cm LA Vol (A2C):   89.1 ml 46.25 ml/m RA Volume:   52.50 ml  27.25 ml/m LA Vol (A4C):   56.5 ml 29.33 ml/m LA Biplane Vol: 72.2 ml 37.48  ml/m  AORTIC VALVE LVOT Vmax:   121.00 cm/s LVOT Vmean:  78.500 cm/s LVOT VTI:    0.238 m  AORTA Ao Root diam: 3.60 cm Ao Asc diam:  3.70 cm MITRAL VALVE                TRICUSPID VALVE MV Area (PHT): 3.70 cm     TR Peak grad:   46.0 mmHg MV Decel Time: 205 msec     TR Vmax:        339.00 cm/s MV E velocity: 120.00 cm/s MV A velocity: 108.00 cm/s  SHUNTS MV E/A ratio:  1.11         Systemic VTI:  0.24 m                             Systemic Diam: 2.00 cm Skeet Latch MD Electronically signed by Skeet Latch MD Signature Date/Time: 11/20/2020/11:36:18 AM    Final     Cardiac Studies   Echocardiogram EF 45 to 50%, mild mitral valve regurgitation-murmur heard on exam  Patient Profile     80 y.o. male with vasovagal syncope twice during hemodialysis accompanied with classic prodrome, nausea, diaphoresis,  vomiting.  Assessment & Plan    Vasovagal syncope - No further episode during last hemodialysis session.  He did very well according to his daughter.  He has felt some epigastric burning which may be the trigger for his vasovagal episodes.  He is going to have an EGD either today or tomorrow.  His daughter did say that he ended up eating this morning.  Chronic combined systolic and diastolic heart failure - EF mildly reduced at 45 to 50%.  We are holding off on beta-blockers given recent drop in pulse in the setting of vasovagal syncope.  Also he is on no angiotensin receptor blocker or ACE inhibitor/Entresto given his end-stage renal disease. - Given his advanced age and multiple comorbidities, he certainly could have underlying coronary artery disease as well.  It would not be unreasonable as an outpatient if symptoms continue such as epigastric burning to proceed with a pharmacologic stress test.  Lets exclude a GI etiology first especially given "hematemesis "noted during this vagal episode.   For questions or updates, please contact [place] Please consult [email or web address] for contact info under        Signed, Candee Furbish, MD  11/21/2020, 8:25 AM

## 2020-11-21 NOTE — Progress Notes (Signed)
PROGRESS NOTE    [name] Colon  ZJQ:[id number] DOB: Oct 09, 1940 DOA: 11/19/2020 PCP: Leonel Ramsay, MD  Brief Narrative: 80 year old male with history of colon cancer, ESRD on hemodialysis, hep C/cirrhosis, hypertension presented to the ED with chest pain in the setting of missed hemodialysis, he reported periodic chest pain for couple of days, was seen in the emergency room on Monday, felt to be atypical, discharged home was unable to attend his normal dialysis treatment, presented back to the ED on 8/16 late night with worsening shortness of breath, started on BiPAP and nitro drip, was subsequently taken for urgent hemodialysis, while in dialysis had an episode of unresponsiveness while on BiPAP, quickly improved, following this he had a second episode of unresponsiveness during which he bradycardia down to the 30s, was started on CPR, he then vomited blood, he had about 1 minute of CPR before ROSC was achieved, subsequently transferred to stepdown unit  Assessment & Plan:   Volume overload -Secondary to missed hemodialysis -Was dialyzed yesterday in the room -Nephrology following, volume status has improved -Clinically do not suspect pneumonia, discontinue Unasyn and monitor  Syncope -Suspected vasovagal episodes -Bradycardia down to 30s the second episode, CODE BLUE was called, had 1 minute of CPR, ROSC was achieved -Cardiology following -Echo with mild cardiomyopathy and global left ventricular hypokinesis -Monitor on telemetry, May need to monitor at discharge  Chest pain Mildly elevated high-sensitivity troponin -echo with EF of 45-50% with global left ventricular hypokinesis -Cardiology consulting, could have underlying CAD, if symptoms persist recommend pharmacological stress test  ESRD on hemodialysis -Nephrology consulting, underwent dialysis yesterday  Hematemesis -No further episodes -Continue IV Protonix, gastroenterology consulting, hemoglobin is stable -Plan  for endoscopy tomorrow -No known varices based on prior endoscopies -Monitor hemoglobin  Anemia of chronic disease -Check anemia panel  Hep C, cirrhosis -Reportedly cured  DVT prophylaxis: SCDs Code Status: Full code Family Communication: Daughter-in-law at bedside Disposition Plan:  Status is: Inpatient  Remains inpatient appropriate because:Inpatient level of care appropriate due to severity of illness  Dispo: The patient is from: Home              Anticipated d/c is to: Home              Patient currently is not medically stable to d/c.   Difficult to place patient No        Consultants:  -Cardiology Nephrology  Procedures:   Antimicrobials:    Subjective: -Feels better today, denies any chest pain or nausea, denies heartburn  Objective: Vitals:   11/20/20 2206 11/21/20 0024 11/21/20 0300 11/21/20 0749  BP: 122/60 136/62 (!) 135/58 (!) 134/57  Pulse: 84 70  80  Resp: 15 20 17 17   Temp: 98.2 F (36.8 C) 98 F (36.7 C) 98.6 F (37 C) 97.9 F (36.6 C)  TempSrc:  Oral Oral Oral  SpO2: 98% 99%  100%  Weight: 76.5 kg     Height:        Intake/Output Summary (Last 24 hours) at 11/21/2020 1027 Last data filed at 11/21/2020 0825 Gross per 24 hour  Intake 271.68 ml  Output 2500 ml  Net -2228.32 ml   Filed Weights   11/19/20 2331 11/20/20 2206  Weight: 78.9 kg 76.5 kg    Examination:  General exam: Elderly pleasant male sitting up in bed, AAOx3 HEENT: No JVD CVS: S1-S2, regular rate rhythm Lungs: Clear bilaterally Abdomen: Soft, nontender, bowel sounds present Extremity : No edema Psych: Appropriate mood and affect  Data  Reviewed:   CBC: Recent Labs  Lab 11/18/20 1146 11/19/20 2341 11/20/20 0006 11/20/20 1018 11/20/20 1455 11/21/20 0441  WBC 9.7 12.6*  --  12.3* 11.6* 11.3*  NEUTROABS 7.1 10.5*  --   --   --   --   HGB 9.5* 8.4* 8.8* 8.2* 7.4* 7.8*  HCT 28.6* 25.3* 26.0* 24.3* 22.3* 22.2*  MCV 92.6 93.7  --  93.1 92.9 90.6  PLT 170  180  --  164 166 741*   Basic Metabolic Panel: Recent Labs  Lab 11/18/20 1146 11/19/20 2341 11/20/20 0006 11/20/20 0827 11/21/20 0441  NA 139 137 140 138 138  K 4.4 4.1 4.0 4.4 4.1  CL 104 105 107 106 99  CO2 23 21*  --  23 27  GLUCOSE 97 179* 166* 113* 82  BUN 79* 100* 93* 78* 57*  CREATININE 9.78* 11.39* 11.40* 8.24* 7.72*  CALCIUM 9.2 9.1  --  9.2 9.0  PHOS  --   --   --  3.5  --    GFR: Estimated Creatinine Clearance: 7.1 mL/min (A) (by C-G formula based on SCr of 7.72 mg/dL (H)). Liver Function Tests: Recent Labs  Lab 11/18/20 1146 11/19/20 2341 11/20/20 0827  AST 18 22  --   ALT 31 26  --   ALKPHOS 116 110  --   BILITOT 0.9 0.8  --   PROT 7.2 6.9  --   ALBUMIN 3.0* 3.0* 2.7*   No results for input(s): LIPASE, AMYLASE in the last 168 hours. No results for input(s): AMMONIA in the last 168 hours. Coagulation Profile: No results for input(s): INR, PROTIME in the last 168 hours. Cardiac Enzymes: No results for input(s): CKTOTAL, CKMB, CKMBINDEX, TROPONINI in the last 168 hours. BNP (last 3 results) No results for input(s): PROBNP in the last 8760 hours. HbA1C: No results for input(s): HGBA1C in the last 72 hours. CBG: Recent Labs  Lab 11/20/20 0947  GLUCAP 129*   Lipid Profile: No results for input(s): CHOL, HDL, LDLCALC, TRIG, CHOLHDL, LDLDIRECT in the last 72 hours. Thyroid Function Tests: No results for input(s): TSH, T4TOTAL, FREET4, T3FREE, THYROIDAB in the last 72 hours. Anemia Panel: No results for input(s): VITAMINB12, FOLATE, FERRITIN, TIBC, IRON, RETICCTPCT in the last 72 hours. Urine analysis: No results found for: COLORURINE, APPEARANCEUR, LABSPEC, PHURINE, GLUCOSEU, HGBUR, BILIRUBINUR, KETONESUR, PROTEINUR, UROBILINOGEN, NITRITE, LEUKOCYTESUR Sepsis Labs: @LABRCNTIP (procalcitonin:4,lacticidven:4)  ) Recent Results (from the past 240 hour(s))  Resp Panel by RT-PCR (Flu A&B, Covid) Nasopharyngeal Swab     Status: None   Collection Time:  11/20/20 12:02 AM   Specimen: Nasopharyngeal Swab; Nasopharyngeal(NP) swabs in vial transport medium  Result Value Ref Range Status   SARS Coronavirus 2 by RT PCR NEGATIVE NEGATIVE Final    Comment: (NOTE) SARS-CoV-2 target nucleic acids are NOT DETECTED.  The SARS-CoV-2 RNA is generally detectable in upper respiratory specimens during the acute phase of infection. The lowest concentration of SARS-CoV-2 viral copies this assay can detect is 138 copies/mL. A negative result does not preclude SARS-Cov-2 infection and should not be used as the sole basis for treatment or other patient management decisions. A negative result may occur with  improper specimen collection/handling, submission of specimen other than nasopharyngeal swab, presence of viral mutation(s) within the areas targeted by this assay, and inadequate number of viral copies(<138 copies/mL). A negative result must be combined with clinical observations, patient history, and epidemiological information. The expected result is Negative.  Fact Sheet for Patients:  EntrepreneurPulse.com.au  Fact  Sheet for Healthcare Providers:  IncredibleEmployment.be  This test is no t yet approved or cleared by the [place] FDA and  has been authorized for detection and/or diagnosis of SARS-CoV-2 by FDA under an Emergency Use Authorization (EUA). This EUA will remain  in effect (meaning this test can be used) for the duration of the COVID-19 declaration under Section 564(b)(1) of the Act, 21 U.S.C.section 360bbb-3(b)(1), unless the authorization is terminated  or revoked sooner.       Influenza A by PCR NEGATIVE NEGATIVE Final   Influenza B by PCR NEGATIVE NEGATIVE Final    Comment: (NOTE) The Xpert Xpress SARS-CoV-2/FLU/RSV plus assay is intended as an aid in the diagnosis of influenza from Nasopharyngeal swab specimens and should not be used as a sole basis for treatment. Nasal washings  and aspirates are unacceptable for Xpert Xpress SARS-CoV-2/FLU/RSV testing.  Fact Sheet for Patients: EntrepreneurPulse.com.au  Fact Sheet for Healthcare Providers: IncredibleEmployment.be  This test is not yet approved or cleared by the [place] FDA and has been authorized for detection and/or diagnosis of SARS-CoV-2 by FDA under an Emergency Use Authorization (EUA). This EUA will remain in effect (meaning this test can be used) for the duration of the COVID-19 declaration under Section 564(b)(1) of the Act, 21 U.S.C. section 360bbb-3(b)(1), unless the authorization is terminated or revoked.  Performed at [facility], [place] [address], [place], [place] [address]   MRSA Next Gen by PCR, Nasal     Status: None   Collection Time: 11/20/20 10:18 AM   Specimen: Nasal Mucosa; Nasal Swab  Result Value Ref Range Status   MRSA by PCR Next Gen NOT DETECTED NOT DETECTED Final    Comment: (NOTE) The GeneXpert MRSA Assay (FDA approved for NASAL specimens only), is one component of a comprehensive MRSA colonization surveillance program. It is not intended to diagnose MRSA infection nor to guide or monitor treatment for MRSA infections. Test performance is not FDA approved in patients less than 23 years old. Performed at [facility], [place] [address], [place], [place] [address]          Radiology Studies: DG Chest Port 1 View  Result Date: 11/20/2020 CLINICAL DATA:  Chest pain short of breath EXAM: PORTABLE CHEST 1 VIEW COMPARISON:  11/18/2020 FINDINGS: Right-sided central venous catheter tip over the cavoatrial region. Increased small bilateral pleural effusions. Stable cardiomediastinal silhouette. Worsened vascular congestion and development of mild pulmonary edema. Aortic atherosclerosis. IMPRESSION: Increased vascular congestion and pulmonary edema compared to prior with development of small right greater than left pleural effusion  Electronically Signed   By: Donavan Foil M.D.   On: 11/20/2020 00:00   ECHOCARDIOGRAM COMPLETE  Result Date: 11/20/2020    ECHOCARDIOGRAM REPORT   Patient Name:   EVERHETT BOZARD Date of Exam: 11/20/2020 Medical Rec #:  [id number]        Height:       68.0 in Accession #:    [id number]       Weight:       174.0 lb Date of Birth:  08/11/1940         BSA:          1.926 m Patient Age:    61 years         BP:           159/60 mmHg Patient Gender: M                HR:  71 bpm. Exam Location:  Inpatient Procedure: 2D Echo, Cardiac Doppler and Color Doppler                          STAT ECHO Reported to: Dr Feliberto Gottron on 11/20/2020 11:08:00 AM. Indications:    Dyspnea R06.00  History:        Patient has no prior history of Echocardiogram examinations.                 Renal Disorder.  Sonographer:    Tiffany Dance RVT Referring Phys: [place]  1. Endocardial border tracking is poor resulting in inaccurate automated LVEF assessment. Mild, global hypokinesis. Left ventricular ejection fraction, by estimation, is 45 to 50%. The left ventricle has mildly decreased function. The left ventricle demonstrates global hypokinesis. Left ventricular diastolic parameters are consistent with Grade I diastolic dysfunction (impaired relaxation). Elevated left ventricular end-diastolic pressure.  2. Right ventricular systolic function is normal. The right ventricular size is normal. There is moderately elevated pulmonary artery systolic pressure.  3. Left atrial size was moderately dilated.  4. The mitral valve is normal in structure. Mild mitral valve regurgitation. No evidence of mitral stenosis.  5. The aortic valve is tricuspid. There is mild calcification of the aortic valve. There is mild thickening of the aortic valve. Aortic valve regurgitation is mild. No aortic stenosis is present.  6. Aortic dilatation noted. There is borderline dilatation of the aortic root, measuring 36 mm. There is  mild dilatation of the ascending aorta, measuring 37 mm.  7. The inferior vena cava is normal in size with greater than 50% respiratory variability, suggesting right atrial pressure of 3 mmHg. FINDINGS  Left Ventricle: Endocardial border tracking is poor resulting in inaccurate automated LVEF assessment. Mild, global hypokinesis. Left ventricular ejection fraction, by estimation, is 45 to 50%. The left ventricle has mildly decreased function. The left ventricle demonstrates global hypokinesis. The left ventricular internal cavity size was normal in size. There is no left ventricular hypertrophy. Left ventricular diastolic parameters are consistent with Grade I diastolic dysfunction (impaired relaxation). Elevated left ventricular end-diastolic pressure. Right Ventricle: The right ventricular size is normal. No increase in right ventricular wall thickness. Right ventricular systolic function is normal. There is moderately elevated pulmonary artery systolic pressure. The tricuspid regurgitant velocity is 3.39 m/s, and with an assumed right atrial pressure of 3 mmHg, the estimated right ventricular systolic pressure is 17.7 mmHg. Left Atrium: Left atrial size was moderately dilated. Right Atrium: Right atrial size was normal in size. Pericardium: There is no evidence of pericardial effusion. Mitral Valve: The mitral valve is normal in structure. Mild mitral valve regurgitation. No evidence of mitral valve stenosis. Tricuspid Valve: The tricuspid valve is normal in structure. Tricuspid valve regurgitation is trivial. No evidence of tricuspid stenosis. Aortic Valve: The aortic valve is tricuspid. There is mild calcification of the aortic valve. There is mild thickening of the aortic valve. Aortic valve regurgitation is mild. No aortic stenosis is present. Pulmonic Valve: The pulmonic valve was normal in structure. Pulmonic valve regurgitation is not visualized. No evidence of pulmonic stenosis. Aorta: Aortic dilatation  noted. There is borderline dilatation of the aortic root, measuring 36 mm. There is mild dilatation of the ascending aorta, measuring 37 mm. Venous: The inferior vena cava is normal in size with greater than 50% respiratory variability, suggesting right atrial pressure of 3 mmHg. IAS/Shunts: No atrial level shunt detected by color flow Doppler.  LEFT VENTRICLE PLAX 2D LVIDd:         4.60 cm  Diastology LVIDs:         3.50 cm  LV e' medial:    6.42 cm/s LV PW:         1.30 cm  LV E/e' medial:  18.7 LV IVS:        1.00 cm  LV e' lateral:   7.18 cm/s LVOT diam:     2.00 cm  LV E/e' lateral: 16.7 LV SV:         75 LV SV Index:   39 LVOT Area:     3.14 cm  RIGHT VENTRICLE             IVC RV Basal diam:  3.70 cm     IVC diam: 1.80 cm RV Mid diam:    2.40 cm RV S prime:     17.40 cm/s TAPSE (M-mode): 2.1 cm LEFT ATRIUM             Index       RIGHT ATRIUM           Index LA diam:        3.60 cm 1.87 cm/m  RA Area:     18.10 cm LA Vol (A2C):   89.1 ml 46.25 ml/m RA Volume:   52.50 ml  27.25 ml/m LA Vol (A4C):   56.5 ml 29.33 ml/m LA Biplane Vol: 72.2 ml 37.48 ml/m  AORTIC VALVE LVOT Vmax:   121.00 cm/s LVOT Vmean:  78.500 cm/s LVOT VTI:    0.238 m  AORTA Ao Root diam: 3.60 cm Ao Asc diam:  3.70 cm MITRAL VALVE                TRICUSPID VALVE MV Area (PHT): 3.70 cm     TR Peak grad:   46.0 mmHg MV Decel Time: 205 msec     TR Vmax:        339.00 cm/s MV E velocity: 120.00 cm/s MV A velocity: 108.00 cm/s  SHUNTS MV E/A ratio:  1.11         Systemic VTI:  0.24 m                             Systemic Diam: 2.00 cm Skeet Latch MD Electronically signed by Skeet Latch MD Signature Date/Time: 11/20/2020/11:36:18 AM    Final         Scheduled Meds:  brinzolamide  1 drop Left Eye BID   And   brimonidine  1 drop Left Eye BID   Chlorhexidine Gluconate Cloth  6 each Topical Q0600   ketorolac  1 drop Left Eye QID   multivitamin  1 tablet Oral Daily   ofloxacin  1 drop Left Eye QID   pantoprazole (PROTONIX) IV   40 mg Intravenous Q12H   prednisoLONE acetate  1 drop Left Eye QID   sodium chloride flush  3 mL Intravenous Q12H   sucralfate  1 g Oral TID WC & HS   torsemide  10 mg Oral Daily   Continuous Infusions:  ampicillin-sulbactam (UNASYN) IV     nitroGLYCERIN Stopped (11/21/20 0700)     LOS: 1 day    Time spent: 12min    Domenic Polite, MD [facility]   11/21/2020, 10:27 AM

## 2020-11-21 NOTE — H&P (View-Only) (Signed)
Daily Rounding Note  11/21/2020, 9:02 AM  LOS: 1 day   SUBJECTIVE:   Chief complaint:   Hematemesis. No further episodes of nausea or vomiting.  Just had the 1 episode of hematemesis associated with syncopal spell yesterday morning.  Tolerating solid food and had breakfast this morning. Had hemodialysis session in the room yesterday afternoon/evening without adverse events.  Patient feels well.  OBJECTIVE:         Vital signs in last 24 hours:    Temp:  [97.6 F (36.4 C)-98.6 F (37 C)] 97.9 F (36.6 C) (08/18 0749) Pulse Rate:  [62-96] 80 (08/18 0749) Resp:  [14-21] 17 (08/18 0749) BP: (96-159)/(57-88) 134/57 (08/18 0749) SpO2:  [97 %-100 %] 100 % (08/18 0749) Weight:  [76.5 kg] 76.5 kg (08/17 2206)   Filed Weights   11/19/20 2331 11/20/20 2206  Weight: 78.9 kg 76.5 kg   General: Alert, comfortable, does not look ill. Heart: RRR. Chest: Clear bilaterally.  No labored breathing or cough. Abdomen: Soft without tenderness.  No distention.  Active bowel sounds. Extremities: No CCE. Neuro/Psych: Alert.  Appropriate.  No tremors.  Not confused.  Intake/Output from previous day: 08/17 0701 - 08/18 0700 In: 31.7 [I.V.:31.7] Out: 2500   Intake/Output this shift: Total I/O In: 240 [P.O.:240] Out: -   Lab Results: Recent Labs    11/20/20 1018 11/20/20 1455 11/21/20 0441  WBC 12.3* 11.6* 11.3*  HGB 8.2* 7.4* 7.8*  HCT 24.3* 22.3* 22.2*  PLT 164 166 143*   BMET Recent Labs    11/19/20 2341 11/20/20 0006 11/20/20 0827 11/21/20 0441  NA 137 140 138 138  K 4.1 4.0 4.4 4.1  CL 105 107 106 99  CO2 21*  --  23 27  GLUCOSE 179* 166* 113* 82  BUN 100* 93* 78* 57*  CREATININE 11.39* 11.40* 8.24* 7.72*  CALCIUM 9.1  --  9.2 9.0   LFT Recent Labs    11/18/20 1146 11/19/20 2341 11/20/20 0827  PROT 7.2 6.9  --   ALBUMIN 3.0* 3.0* 2.7*  AST 18 22  --   ALT 31 26  --   ALKPHOS 116 110  --   BILITOT 0.9  0.8  --    PT/INR No results for input(s): LABPROT, INR in the last 72 hours. Hepatitis Panel Recent Labs    11/20/20 0827  HEPBSAG NON REACTIVE    Studies/Results: DG Chest Port 1 View  Result Date: 11/20/2020 CLINICAL DATA:  Chest pain short of breath EXAM: PORTABLE CHEST 1 VIEW COMPARISON:  11/18/2020 FINDINGS: Right-sided central venous catheter tip over the cavoatrial region. Increased small bilateral pleural effusions. Stable cardiomediastinal silhouette. Worsened vascular congestion and development of mild pulmonary edema. Aortic atherosclerosis. IMPRESSION: Increased vascular congestion and pulmonary edema compared to prior with development of small right greater than left pleural effusion Electronically Signed   By: Donavan Foil M.D.   On: 11/20/2020 00:00   ECHOCARDIOGRAM COMPLETE  Result Date: 11/20/2020    ECHOCARDIOGRAM REPORT   Patient Name:   [name] Colon Date of Exam: 11/20/2020 Medical Rec #:  [id number]        Height:       68.0 in Accession #:    [id number]       Weight:       174.0 lb Date of Birth:  10/04/1940         BSA:          1.926 m Patient  Age:    44 years         BP:           159/60 mmHg Patient Gender: M                HR:           71 bpm. Exam Location:  Inpatient Procedure: 2D Echo, Cardiac Doppler and Color Doppler                          STAT ECHO Reported to: Dr Feliberto Gottron on 11/20/2020 11:08:00 AM. Indications:    Dyspnea R06.00  History:        Patient has no prior history of Echocardiogram examinations.                 Renal Disorder.  Sonographer:    Tiffany Dance RVT Referring Phys: [place]  1. Endocardial border tracking is poor resulting in inaccurate automated LVEF assessment. Mild, global hypokinesis. Left ventricular ejection fraction, by estimation, is 45 to 50%. The left ventricle has mildly decreased function. The left ventricle demonstrates global hypokinesis. Left ventricular diastolic parameters are consistent  with Grade I diastolic dysfunction (impaired relaxation). Elevated left ventricular end-diastolic pressure.  2. Right ventricular systolic function is normal. The right ventricular size is normal. There is moderately elevated pulmonary artery systolic pressure.  3. Left atrial size was moderately dilated.  4. The mitral valve is normal in structure. Mild mitral valve regurgitation. No evidence of mitral stenosis.  5. The aortic valve is tricuspid. There is mild calcification of the aortic valve. There is mild thickening of the aortic valve. Aortic valve regurgitation is mild. No aortic stenosis is present.  6. Aortic dilatation noted. There is borderline dilatation of the aortic root, measuring 36 mm. There is mild dilatation of the ascending aorta, measuring 37 mm.  7. The inferior vena cava is normal in size with greater than 50% respiratory variability, suggesting right atrial pressure of 3 mmHg. FINDINGS  Left Ventricle: Endocardial border tracking is poor resulting in inaccurate automated LVEF assessment. Mild, global hypokinesis. Left ventricular ejection fraction, by estimation, is 45 to 50%. The left ventricle has mildly decreased function. The left ventricle demonstrates global hypokinesis. The left ventricular internal cavity size was normal in size. There is no left ventricular hypertrophy. Left ventricular diastolic parameters are consistent with Grade I diastolic dysfunction (impaired relaxation). Elevated left ventricular end-diastolic pressure. Right Ventricle: The right ventricular size is normal. No increase in right ventricular wall thickness. Right ventricular systolic function is normal. There is moderately elevated pulmonary artery systolic pressure. The tricuspid regurgitant velocity is 3.39 m/s, and with an assumed right atrial pressure of 3 mmHg, the estimated right ventricular systolic pressure is 95.6 mmHg. Left Atrium: Left atrial size was moderately dilated. Right Atrium: Right atrial  size was normal in size. Pericardium: There is no evidence of pericardial effusion. Mitral Valve: The mitral valve is normal in structure. Mild mitral valve regurgitation. No evidence of mitral valve stenosis. Tricuspid Valve: The tricuspid valve is normal in structure. Tricuspid valve regurgitation is trivial. No evidence of tricuspid stenosis. Aortic Valve: The aortic valve is tricuspid. There is mild calcification of the aortic valve. There is mild thickening of the aortic valve. Aortic valve regurgitation is mild. No aortic stenosis is present. Pulmonic Valve: The pulmonic valve was normal in structure. Pulmonic valve regurgitation is not visualized. No evidence of pulmonic stenosis. Aorta: Aortic dilatation  noted. There is borderline dilatation of the aortic root, measuring 36 mm. There is mild dilatation of the ascending aorta, measuring 37 mm. Venous: The inferior vena cava is normal in size with greater than 50% respiratory variability, suggesting right atrial pressure of 3 mmHg. IAS/Shunts: No atrial level shunt detected by color flow Doppler.  LEFT VENTRICLE PLAX 2D LVIDd:         4.60 cm  Diastology LVIDs:         3.50 cm  LV e' medial:    6.42 cm/s LV PW:         1.30 cm  LV E/e' medial:  18.7 LV IVS:        1.00 cm  LV e' lateral:   7.18 cm/s LVOT diam:     2.00 cm  LV E/e' lateral: 16.7 LV SV:         75 LV SV Index:   39 LVOT Area:     3.14 cm  RIGHT VENTRICLE             IVC RV Basal diam:  3.70 cm     IVC diam: 1.80 cm RV Mid diam:    2.40 cm RV S prime:     17.40 cm/s TAPSE (M-mode): 2.1 cm LEFT ATRIUM             Index       RIGHT ATRIUM           Index LA diam:        3.60 cm 1.87 cm/m  RA Area:     18.10 cm LA Vol (A2C):   89.1 ml 46.25 ml/m RA Volume:   52.50 ml  27.25 ml/m LA Vol (A4C):   56.5 ml 29.33 ml/m LA Biplane Vol: 72.2 ml 37.48 ml/m  AORTIC VALVE LVOT Vmax:   121.00 cm/s LVOT Vmean:  78.500 cm/s LVOT VTI:    0.238 m  AORTA Ao Root diam: 3.60 cm Ao Asc diam:  3.70 cm MITRAL  VALVE                TRICUSPID VALVE MV Area (PHT): 3.70 cm     TR Peak grad:   46.0 mmHg MV Decel Time: 205 msec     TR Vmax:        339.00 cm/s MV E velocity: 120.00 cm/s MV A velocity: 108.00 cm/s  SHUNTS MV E/A ratio:  1.11         Systemic VTI:  0.24 m                             Systemic Diam: 2.00 cm Skeet Latch MD Electronically signed by Skeet Latch MD Signature Date/Time: 11/20/2020/11:36:18 AM    Final     ASSESMENT:     Hematemesis. Single episode a/w missed HD, volume overload, bradycardia.     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    Anemia.  Hgb 9.5 >> 7.4 >> 7.8.  Jehovah's Witness so will not accept blood products.    Eradicated HCV.  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    Thrombocytopenia, platelets 143 today.  ESRD.  Vasovagal syncope during HD yesterday.  Able to diurese successfully without recurrent syncope later in the day.  CHF.  EF of 45 to 50%.  Elevated troponins in the 40s , 80s, 102.  Epigastric burning.  Dr. Marlou Porch, cardiologist, suggests (outpt) pharmacologic stress test.  "Lets exclude a  GI etiology first especially given hematemesis noted during vagal episode"   PLAN      Plan for EGD tomorrow since the patient ate today.  Orders for n.p.o. after midnight and orders for procedure placed.    Azucena Freed  11/21/2020, 9:02 AM Phone [phone]    Attending Physician's Attestation   I have taken an interval history, reviewed the chart and examined the patient.   80 year old male with end-stage renal disease who missed dialysis, presented with hypervolemia and experienced a vasovagal episode in dialysis prompting a CODE BLUE associated with an episode of hematemesis.  He has had only a slight drop in his hemoglobin and has been hemodynamically stable.  He has been complaining of frequent heartburn, worse than his baseline.  I suspect his hematemesis was likely related to reflux esophagitis, and not a  significant upper GI bleed.  We were planning for an upper endoscopy today, but the patient ate breakfast, therefore we will plan for an upper endoscopy tomorrow.  He had an echocardiogram which was reassuring, and a cardiology evaluation revealed no concerns for major underlying cardiac problems. Continue PPI twice daily and Carafate for now.  If no significant esophagitis/ulceration seen on EGD tomorrow, will discontinue Carafate Please make sure patient is n.p.o. after midnight  I agree with the Advanced Practitioner's note, impression, and recommendations with updates and my documentation above.   Dustin Flock, MD [facility]

## 2020-11-21 NOTE — Progress Notes (Addendum)
Daily Rounding Note  11/21/2020, 9:02 AM  LOS: 1 day   SUBJECTIVE:   Chief complaint:   Hematemesis. No further episodes of nausea or vomiting.  Just had the 1 episode of hematemesis associated with syncopal spell yesterday morning.  Tolerating solid food and had breakfast this morning. Had hemodialysis session in the room yesterday afternoon/evening without adverse events.  Patient feels well.  OBJECTIVE:         Vital signs in last 24 hours:    Temp:  [97.6 F (36.4 C)-98.6 F (37 C)] 97.9 F (36.6 C) (08/18 0749) Pulse Rate:  [62-96] 80 (08/18 0749) Resp:  [14-21] 17 (08/18 0749) BP: (96-159)/(57-88) 134/57 (08/18 0749) SpO2:  [97 %-100 %] 100 % (08/18 0749) Weight:  [76.5 kg] 76.5 kg (08/17 2206)   Filed Weights   11/19/20 2331 11/20/20 2206  Weight: 78.9 kg 76.5 kg   General: Alert, comfortable, does not look ill. Heart: RRR. Chest: Clear bilaterally.  No labored breathing or cough. Abdomen: Soft without tenderness.  No distention.  Active bowel sounds. Extremities: No CCE. Neuro/Psych: Alert.  Appropriate.  No tremors.  Not confused.  Intake/Output from previous day: 08/17 0701 - 08/18 0700 In: 31.7 [I.V.:31.7] Out: 2500   Intake/Output this shift: Total I/O In: 240 [P.O.:240] Out: -   Lab Results: Recent Labs    11/20/20 1018 11/20/20 1455 11/21/20 0441  WBC 12.3* 11.6* 11.3*  HGB 8.2* 7.4* 7.8*  HCT 24.3* 22.3* 22.2*  PLT 164 166 143*   BMET Recent Labs    11/19/20 2341 11/20/20 0006 11/20/20 0827 11/21/20 0441  NA 137 140 138 138  K 4.1 4.0 4.4 4.1  CL 105 107 106 99  CO2 21*  --  23 27  GLUCOSE 179* 166* 113* 82  BUN 100* 93* 78* 57*  CREATININE 11.39* 11.40* 8.24* 7.72*  CALCIUM 9.1  --  9.2 9.0   LFT Recent Labs    11/18/20 1146 11/19/20 2341 11/20/20 0827  PROT 7.2 6.9  --   ALBUMIN 3.0* 3.0* 2.7*  AST 18 22  --   ALT 31 26  --   ALKPHOS 116 110  --   BILITOT 0.9  0.8  --    PT/INR No results for input(s): LABPROT, INR in the last 72 hours. Hepatitis Panel Recent Labs    11/20/20 0827  HEPBSAG NON REACTIVE    Studies/Results: DG Chest Port 1 View  Result Date: 11/20/2020 CLINICAL DATA:  Chest pain short of breath EXAM: PORTABLE CHEST 1 VIEW COMPARISON:  11/18/2020 FINDINGS: Right-sided central venous catheter tip over the cavoatrial region. Increased small bilateral pleural effusions. Stable cardiomediastinal silhouette. Worsened vascular congestion and development of mild pulmonary edema. Aortic atherosclerosis. IMPRESSION: Increased vascular congestion and pulmonary edema compared to prior with development of small right greater than left pleural effusion Electronically Signed   By: Donavan Foil M.D.   On: 11/20/2020 00:00   ECHOCARDIOGRAM COMPLETE  Result Date: 11/20/2020    ECHOCARDIOGRAM REPORT   Patient Name:   [name] Colon Date of Exam: 11/20/2020 Medical Rec #:  [id number]        Height:       68.0 in Accession #:    [id number]       Weight:       174.0 lb Date of Birth:  [birth year]         BSA:          1.926 m Patient  Age:    80 years         BP:           159/60 mmHg Patient Gender: M                HR:           71 bpm. Exam Location:  Inpatient Procedure: 2D Echo, Cardiac Doppler and Color Doppler                          STAT ECHO Reported to: Dr Feliberto Gottron on 11/20/2020 11:08:00 AM. Indications:    Dyspnea R06.00  History:        Patient has no prior history of Echocardiogram examinations.                 Renal Disorder.  Sonographer:    Tiffany Dance RVT Referring Phys: [place]  1. Endocardial border tracking is poor resulting in inaccurate automated LVEF assessment. Mild, global hypokinesis. Left ventricular ejection fraction, by estimation, is 45 to 50%. The left ventricle has mildly decreased function. The left ventricle demonstrates global hypokinesis. Left ventricular diastolic parameters are consistent  with Grade I diastolic dysfunction (impaired relaxation). Elevated left ventricular end-diastolic pressure.  2. Right ventricular systolic function is normal. The right ventricular size is normal. There is moderately elevated pulmonary artery systolic pressure.  3. Left atrial size was moderately dilated.  4. The mitral valve is normal in structure. Mild mitral valve regurgitation. No evidence of mitral stenosis.  5. The aortic valve is tricuspid. There is mild calcification of the aortic valve. There is mild thickening of the aortic valve. Aortic valve regurgitation is mild. No aortic stenosis is present.  6. Aortic dilatation noted. There is borderline dilatation of the aortic root, measuring 36 mm. There is mild dilatation of the ascending aorta, measuring 37 mm.  7. The inferior vena cava is normal in size with greater than 50% respiratory variability, suggesting right atrial pressure of 3 mmHg. FINDINGS  Left Ventricle: Endocardial border tracking is poor resulting in inaccurate automated LVEF assessment. Mild, global hypokinesis. Left ventricular ejection fraction, by estimation, is 45 to 50%. The left ventricle has mildly decreased function. The left ventricle demonstrates global hypokinesis. The left ventricular internal cavity size was normal in size. There is no left ventricular hypertrophy. Left ventricular diastolic parameters are consistent with Grade I diastolic dysfunction (impaired relaxation). Elevated left ventricular end-diastolic pressure. Right Ventricle: The right ventricular size is normal. No increase in right ventricular wall thickness. Right ventricular systolic function is normal. There is moderately elevated pulmonary artery systolic pressure. The tricuspid regurgitant velocity is 3.39 m/s, and with an assumed right atrial pressure of 3 mmHg, the estimated right ventricular systolic pressure is 36.1 mmHg. Left Atrium: Left atrial size was moderately dilated. Right Atrium: Right atrial  size was normal in size. Pericardium: There is no evidence of pericardial effusion. Mitral Valve: The mitral valve is normal in structure. Mild mitral valve regurgitation. No evidence of mitral valve stenosis. Tricuspid Valve: The tricuspid valve is normal in structure. Tricuspid valve regurgitation is trivial. No evidence of tricuspid stenosis. Aortic Valve: The aortic valve is tricuspid. There is mild calcification of the aortic valve. There is mild thickening of the aortic valve. Aortic valve regurgitation is mild. No aortic stenosis is present. Pulmonic Valve: The pulmonic valve was normal in structure. Pulmonic valve regurgitation is not visualized. No evidence of pulmonic stenosis. Aorta: Aortic dilatation  noted. There is borderline dilatation of the aortic root, measuring 36 mm. There is mild dilatation of the ascending aorta, measuring 37 mm. Venous: The inferior vena cava is normal in size with greater than 50% respiratory variability, suggesting right atrial pressure of 3 mmHg. IAS/Shunts: No atrial level shunt detected by color flow Doppler.  LEFT VENTRICLE PLAX 2D LVIDd:         4.60 cm  Diastology LVIDs:         3.50 cm  LV e' medial:    6.42 cm/s LV PW:         1.30 cm  LV E/e' medial:  18.7 LV IVS:        1.00 cm  LV e' lateral:   7.18 cm/s LVOT diam:     2.00 cm  LV E/e' lateral: 16.7 LV SV:         75 LV SV Index:   39 LVOT Area:     3.14 cm  RIGHT VENTRICLE             IVC RV Basal diam:  3.70 cm     IVC diam: 1.80 cm RV Mid diam:    2.40 cm RV S prime:     17.40 cm/s TAPSE (M-mode): 2.1 cm LEFT ATRIUM             Index       RIGHT ATRIUM           Index LA diam:        3.60 cm 1.87 cm/m  RA Area:     18.10 cm LA Vol (A2C):   89.1 ml 46.25 ml/m RA Volume:   52.50 ml  27.25 ml/m LA Vol (A4C):   56.5 ml 29.33 ml/m LA Biplane Vol: 72.2 ml 37.48 ml/m  AORTIC VALVE LVOT Vmax:   121.00 cm/s LVOT Vmean:  78.500 cm/s LVOT VTI:    0.238 m  AORTA Ao Root diam: 3.60 cm Ao Asc diam:  3.70 cm MITRAL  VALVE                TRICUSPID VALVE MV Area (PHT): 3.70 cm     TR Peak grad:   46.0 mmHg MV Decel Time: 205 msec     TR Vmax:        339.00 cm/s MV E velocity: 120.00 cm/s MV A velocity: 108.00 cm/s  SHUNTS MV E/A ratio:  1.11         Systemic VTI:  0.24 m                             Systemic Diam: 2.00 cm Skeet Latch MD Electronically signed by Skeet Latch MD Signature Date/Time: 11/20/2020/11:36:18 AM    Final     ASSESMENT:     Hematemesis. Single episode a/w missed HD, volume overload, bradycardia.     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    Anemia.  Hgb 9.5 >> 7.4 >> 7.8.  Jehovah's Witness so will not accept blood products.    Eradicated HCV.  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    Thrombocytopenia, platelets 143 today.  ESRD.  Vasovagal syncope during HD yesterday.  Able to diurese successfully without recurrent syncope later in the day.  CHF.  EF of 45 to 50%.  Elevated troponins in the 40s , 80s, 102.  Epigastric burning.  Dr. Marlou Porch, cardiologist, suggests (outpt) pharmacologic stress test.  "Lets exclude a  GI etiology first especially given hematemesis noted during vagal episode"   PLAN      Plan for EGD tomorrow since the patient ate today.  Orders for n.p.o. after midnight and orders for procedure placed.    [name] Colon  11/21/2020, 9:02 AM Phone [phone]    Attending Physician's Attestation   I have taken an interval history, reviewed the chart and examined the patient.   80 year old male with end-stage renal disease who missed dialysis, presented with hypervolemia and experienced a vasovagal episode in dialysis prompting a CODE BLUE associated with an episode of hematemesis.  He has had only a slight drop in his hemoglobin and has been hemodynamically stable.  He has been complaining of frequent heartburn, worse than his baseline.  I suspect his hematemesis was likely related to reflux esophagitis, and not a  significant upper GI bleed.  We were planning for an upper endoscopy today, but the patient ate breakfast, therefore we will plan for an upper endoscopy tomorrow.  He had an echocardiogram which was reassuring, and a cardiology evaluation revealed no concerns for major underlying cardiac problems. Continue PPI twice daily and Carafate for now.  If no significant esophagitis/ulceration seen on EGD tomorrow, will discontinue Carafate Please make sure patient is n.p.o. after midnight  I agree with the Advanced Practitioner's note, impression, and recommendations with updates and my documentation above.   Dustin Flock, MD [facility]

## 2020-11-21 NOTE — Progress Notes (Signed)
Dialysis fistula present to LUA. PT reports fistula was placed at [facility], but is unable to remember when. Fistula is not in use yet per PT. Restricted limb band placed.

## 2020-11-22 ENCOUNTER — Inpatient Hospital Stay (HOSPITAL_COMMUNITY): Payer: Medicare Other | Admitting: Anesthesiology

## 2020-11-22 ENCOUNTER — Encounter (HOSPITAL_COMMUNITY): Payer: Self-pay | Admitting: Internal Medicine

## 2020-11-22 ENCOUNTER — Encounter (HOSPITAL_COMMUNITY)
Admission: EM | Disposition: A | Discharge status: Home Health | Payer: Self-pay | Source: Home / Self Care | Attending: Internal Medicine

## 2020-11-22 DIAGNOSIS — K2211 Ulcer of esophagus with bleeding: Secondary | ICD-10-CM

## 2020-11-22 DIAGNOSIS — J810 Acute pulmonary edema: Secondary | ICD-10-CM | POA: Diagnosis not present

## 2020-11-22 DIAGNOSIS — K226 Gastro-esophageal laceration-hemorrhage syndrome: Secondary | ICD-10-CM

## 2020-11-22 DIAGNOSIS — I8510 Secondary esophageal varices without bleeding: Secondary | ICD-10-CM

## 2020-11-22 DIAGNOSIS — I5043 Acute on chronic combined systolic (congestive) and diastolic (congestive) heart failure: Secondary | ICD-10-CM | POA: Diagnosis not present

## 2020-11-22 HISTORY — PX: HEMOSTASIS CLIP PLACEMENT: SHX6857

## 2020-11-22 HISTORY — PX: ESOPHAGOGASTRODUODENOSCOPY (EGD) WITH PROPOFOL: SHX5813

## 2020-11-22 LAB — BASIC METABOLIC PANEL
Anion gap: 9 (ref 5–15)
BUN: 81 mg/dL — ABNORMAL HIGH (ref 8–23)
CO2: 26 mmol/L (ref 22–32)
Calcium: 8.5 mg/dL — ABNORMAL LOW (ref 8.9–10.3)
Chloride: 102 mmol/L (ref 98–111)
Creatinine, Ser: 9.21 mg/dL — ABNORMAL HIGH (ref 0.61–1.24)
GFR, Estimated: 5 mL/min — ABNORMAL LOW (ref >60)
Glucose, Bld: 92 mg/dL (ref 70–99)
Potassium: 4.8 mmol/L (ref 3.5–5.1)
Sodium: 137 mmol/L (ref 135–145)

## 2020-11-22 LAB — CBC
HCT: 21.7 % — ABNORMAL LOW (ref 39.0–52.0)
Hemoglobin: 7.3 g/dL — ABNORMAL LOW (ref 13.0–17.0)
MCH: 30.9 pg (ref 26.0–34.0)
MCHC: 33.6 g/dL (ref 30.0–36.0)
MCV: 91.9 fL (ref 80.0–100.0)
Platelets: 136 10*3/uL — ABNORMAL LOW (ref 150–400)
RBC: 2.36 MIL/uL — ABNORMAL LOW (ref 4.22–5.81)
RDW: 15.2 % (ref 11.5–15.5)
WBC: 9.5 10*3/uL (ref 4.0–10.5)
nRBC: 0 % (ref 0.0–0.2)

## 2020-11-22 SURGERY — ESOPHAGOGASTRODUODENOSCOPY (EGD) WITH PROPOFOL
Anesthesia: Monitor Anesthesia Care

## 2020-11-22 MED ORDER — HEPARIN SODIUM (PORCINE) 1000 UNIT/ML IJ SOLN
1000.0000 [IU] | Freq: Once | INTRAMUSCULAR | Status: AC
  Administered 2020-11-22: 1000 [IU] via INTRAVENOUS

## 2020-11-22 MED ORDER — PROPOFOL 500 MG/50ML IV EMUL
INTRAVENOUS | Status: DC | PRN
  Administered 2020-11-22: 125 ug/kg/min via INTRAVENOUS

## 2020-11-22 MED ORDER — SODIUM CHLORIDE 0.9 % IV SOLN
INTRAVENOUS | Status: AC | PRN
  Administered 2020-11-22: 500 mL via INTRAMUSCULAR

## 2020-11-22 MED ORDER — DARBEPOETIN ALFA 100 MCG/0.5ML IJ SOSY
100.0000 ug | PREFILLED_SYRINGE | INTRAMUSCULAR | Status: DC
  Administered 2020-11-22: 100 ug via INTRAVENOUS
  Filled 2020-11-22 (×2): qty 0.5

## 2020-11-22 MED ORDER — ORAL CARE MOUTH RINSE
15.0000 mL | Freq: Two times a day (BID) | OROMUCOSAL | Status: DC
  Administered 2020-11-23 – 2020-12-03 (×16): 15 mL via OROMUCOSAL

## 2020-11-22 MED ORDER — PROPOFOL 10 MG/ML IV BOLUS
INTRAVENOUS | Status: DC | PRN
  Administered 2020-11-22 (×2): 20 mg via INTRAVENOUS

## 2020-11-22 MED ORDER — PHENYLEPHRINE 40 MCG/ML (10ML) SYRINGE FOR IV PUSH (FOR BLOOD PRESSURE SUPPORT)
PREFILLED_SYRINGE | INTRAVENOUS | Status: DC | PRN
  Administered 2020-11-22 (×2): 160 ug via INTRAVENOUS

## 2020-11-22 NOTE — Progress Notes (Signed)
Patient and family requesting to have dialysis later in the day, preferable after lunch due to being fatigued and feeling weak/"worn down". Will pass off information to AM nurse.

## 2020-11-22 NOTE — Plan of Care (Signed)

## 2020-11-22 NOTE — Progress Notes (Signed)
Consent for EGD in PT's chart. Patient has remained NPO since 0000. Patient understands all instructions at this time.

## 2020-11-22 NOTE — Progress Notes (Signed)
[facility] Progress Note   Assessment/ Plan:   1 SOB/ respiratory distress: missed HD on Monday- likely volume overload.  Possbily aspirated during vomiting episode on BiPaP so on Unasyn too.  Improved yesterday after HD 2.  Suspected UGIB: no documented varices 03/2020 in [facility]'s system.  GI consulting--> plan for EGD today.  Pt is a Jehovah's witness--> does NOT want blood products.  Would use caution with carafate in ESRD (can cause aluminum toxicity). 3.  Chest pain/ vasovagal syncope- cardiology c/s, appreciate assistance.  Interestingly, pt was prescribed Simbriza eye gtts from a cataract surgery last week--> Has A2 agonist properties so ? 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  HD tomorrow on schedule 5 Hypertension: not on any BP meds as OP per chart- pt's dtr says he's on amlodipine 10 mg daily.  On torsemide 20 mg on non-dialysis days. 6 Anemia of ESRD:  not due for mircera yet- Hgb 7.3, order Aranesp with HD today 7. Metabolic Bone Disease: calcitriol with rx, Auryxia as binder 8.  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  H/o prostate cancer 10: h/o colon cancer 11: Dispo: SDU  Subjective:    For endoscopy and HD thereafter today.  Hgb down to 7.3.     Objective:   BP (!) 171/57   Pulse 74   Temp 98.1 F (36.7 C) (Oral)   Resp 15   Ht 5\' 8"  (1.727 m)   Wt 76.5 kg   SpO2 100%   BMI 25.64 kg/m   Physical Exam: GEN sitting in chair, appears well HEENT EOMI PERRL NECK no JVD PULM clear anteriorly, faint posterior bibasilar crackles CV RRR ABD soft, mildly distended, improved EXT no real LE edema NEURO AAO x 3 nonfocal ACCESS: R IJ [facility]  Labs: BMET Recent Labs  Lab 11/18/20 1146 11/19/20 2341 11/20/20 0006 11/20/20 0827 11/21/20 0441 11/22/20 0044  NA 139 137 140 138 138 137  K 4.4 4.1 4.0 4.4 4.1 4.8  CL 104 105 107 106 99 102  CO2 23 21*  --  23 27 26   GLUCOSE 97 179* 166*  113* 82 92  BUN 79* 100* 93* 78* 57* 81*  CREATININE 9.78* 11.39* 11.40* 8.24* 7.72* 9.21*  CALCIUM 9.2 9.1  --  9.2 9.0 8.5*  PHOS  --   --   --  3.5  --   --    CBC Recent Labs  Lab 11/18/20 1146 11/19/20 2341 11/20/20 0006 11/20/20 1018 11/20/20 1455 11/21/20 0441 11/22/20 0044  WBC 9.7 12.6*  --  12.3* 11.6* 11.3* 9.5  NEUTROABS 7.1 10.5*  --   --   --   --   --   HGB 9.5* 8.4*   < > 8.2* 7.4* 7.8* 7.3*  HCT 28.6* 25.3*   < > 24.3* 22.3* 22.2* 21.7*  MCV 92.6 93.7  --  93.1 92.9 90.6 91.9  PLT 170 180  --  164 166 143* 136*   < > = values in this interval not displayed.      Medications:     [MAR Hold] brinzolamide  1 drop Left Eye BID   And   [MAR Hold] brimonidine  1 drop Left Eye BID   [MAR Hold] Chlorhexidine Gluconate Cloth  6 each Topical Q0600   [MAR Hold] ferric citrate  420 mg Oral BID WC   [MAR Hold] ketorolac  1 drop Left Eye QID   [  MAR Hold] multivitamin  1 tablet Oral Daily   [MAR Hold] ofloxacin  1 drop Left Eye QID   [MAR Hold] pantoprazole (PROTONIX) IV  40 mg Intravenous Q12H   [MAR Hold] prednisoLONE acetate  1 drop Left Eye QID   [MAR Hold] sodium chloride flush  3 mL Intravenous Q12H   [MAR Hold] sucralfate  1 g Oral TID WC & HS   [MAR Hold] torsemide  10 mg Oral Daily     Madelon Lips MD 11/22/2020, 10:32 AM

## 2020-11-22 NOTE — Op Note (Signed)
[facility] Patient Name: [name] Colon Procedure Date : 11/22/2020 MRN: [id number] Attending MD: Gladstone Pih. Candis Schatz , MD Date of Birth: June 13, 1940 CSN: [id number] Age: 80 Admit Type: Inpatient Procedure:                Upper GI endoscopy Indications:              Hematemesis; pt experience one episode of                            hematemesis 2 days ago in the setting of a                            vasovagal episode. No further hematemesis. He does                            have persistent heartburn Providers:                Nicki Reaper E. Candis Schatz, MD, Brooke Person, Laverda Sorenson, Technician, Vickii Penna, CRNA Referring MD:              Medicines:                Monitored Anesthesia Care Complications:            No immediate complications. Estimated Blood Loss:     Estimated blood loss: None. Procedure:                Pre-Anesthesia Assessment:                           - Prior to the procedure, a History and Physical                            was performed, and patient medications and                            allergies were reviewed. The patient's tolerance of                            previous anesthesia was also reviewed. The risks                            and benefits of the procedure and the sedation                            options and risks were discussed with the patient.                            All questions were answered, and informed consent                            was obtained. Prior Anticoagulants: The patient has  taken no previous anticoagulant or antiplatelet                            agents. ASA Grade Assessment: III - A patient with                            severe systemic disease. After reviewing the risks                            and benefits, the patient was deemed in                            satisfactory condition to undergo the procedure.                            After obtaining informed consent, the endoscope was                            passed under direct vision. Throughout the                            procedure, the patient's blood pressure, pulse, and                            oxygen saturations were monitored continuously. The                            [id number]-H190 ([id number]) Olympus endoscope was introduced                            through the mouth, and advanced to the antrum of                            the stomach. The upper GI endoscopy was                            accomplished without difficulty. The patient                            tolerated the procedure well. The GIF-[id number]                            ([id number]) Olympus endoscope was introduced through                            the mouth, and advanced to the antrum of the                            stomach. Scope In: Scope Out: Findings:      The examined portions of the nasopharynx, oropharynx and larynx were       normal.      A large amount of bright red and clotted blood was found in the gastric       fundus and in the gastric body.  Attempts to clear the blood were       unsuccessful with the standard gastroscope, so this was withdrawn and       the therapeutic endoscope was inserted. Using a combination of       lavage/suction and Jabier Mutton net, the blood and clot burden was removed from       the fundus to allow adequate examination of this area.      Two superficial esophageal ulcers were found at the gastroesophageal       junction, one of which was actively bleeding. The largest lesion was 5       mm in largest dimension. For hemostasis, three hemostatic clips were       successfully placed (MR conditional), 2 on the larger, bleeding ulcer       and one on the smaller ulcer. There was no bleeding at the end of the       procedure.      Small (< 5 mm) varices were found in the lower third of the esophagus.      The exam of the esophagus was otherwise normal.      A 3 cm  hiatal hernia was present.      The exam of the stomach was otherwise normal. Impression:               - The examined portions of the nasopharynx,                            oropharynx and larynx were normal.                           - Esophageal ulcers at the GEJ actively bleeding.                            Clips (MR conditional) were placed with no bleeding                            at the end of the procedure. I suspect these were                            Mallory Weiss tears from the patient's                            retching/vomiting associated with the vasovagal                            episode.                           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                           - 3 cm hiatal hernia.                           - Clotted blood in the gastric fundus and in the  gastric body.                           - No specimens collected.                           - Due to the active bleeding, attention was                            immediately given to identifying and treating the                            bleeding source, which was the GEJ ulcers. The                            duodenum was not examined prior to this                            intervention. To avoid potentially disrupting the                            hemoclips, the endoscope was not advanced past the                            hemoclips after placement; therefore the duodenum                            was not examined prior to completion of the exam.                            There was very little suspicion for an additional                            bleeding source in the duodenum. Recommendation:           - Return patient to hospital ward for ongoing care.                           - NPO except meds and sips of water today.                            Potentially advance to clear liquids tomorrow.                           - Continue present medications.                            - Continue BID PPI for 8 weeks and QID carafate for                            at least a week, preferrably if okay with                            nephrology.                           -  No blood thinners, aspirin, ibuprofen, naproxen,                            or other non-steroidal anti-inflammatory drugs.                           - Repeat EGD in 8 weeks to assess resolution of                            ulcers. Procedure Code(s):        --- Professional ---                           [phone], 50, Esophagogastroduodenoscopy, flexible,                            transoral; with control of bleeding, any method Diagnosis Code(s):        --- Professional ---                           K22.11, Ulcer of esophagus with bleeding                           I85.00, Esophageal varices without bleeding                           K44.9, Diaphragmatic hernia without obstruction or                            gangrene                           K92.2, Gastrointestinal hemorrhage, unspecified                           K92.0, Hematemesis CPT copyright 2019 American Medical Association. All rights reserved. The codes documented in this report are preliminary and upon coder review may  be revised to meet current compliance requirements. Mrytle Bento E. Candis Schatz, MD 11/22/2020 12:02:50 PM This report has been signed electronically. Number of Addenda: 0

## 2020-11-22 NOTE — Transfer of Care (Signed)
Immediate Anesthesia Transfer of Care Note  Patient: [name] Colon  Procedure(s) Performed: ESOPHAGOGASTRODUODENOSCOPY (EGD) WITH PROPOFOL HEMOSTASIS CLIP PLACEMENT  Patient Location: Endoscopy Unit  Anesthesia Type:MAC  Level of Consciousness: drowsy and patient cooperative  Airway & Oxygen Therapy: Patient Spontanous Breathing  Post-op Assessment: Report given to RN and Post -op Vital signs reviewed and stable  Post vital signs: Reviewed and stable  Last Vitals:  Vitals Value Taken Time  BP 151/47 11/22/20 1152  Temp    Pulse 62 11/22/20 1152  Resp 21 11/22/20 1152  SpO2 99 % 11/22/20 1152  Vitals shown include unvalidated device data.  Last Pain:  Vitals:   11/22/20 0937  TempSrc: Oral  PainSc: 0-No pain         Complications: No notable events documented.

## 2020-11-22 NOTE — Progress Notes (Signed)
Heart Failure Navigator Progress Note  Assessed for Heart & Vascular TOC clinic readiness.  Patient does not meet criteria due to ESRD, SCr 7-9.   Navigator available for reassessment of patient.   Pricilla Holm, MSN, RN Heart Failure Nurse Navigator [phone]

## 2020-11-22 NOTE — Interval H&P Note (Signed)
History and Physical Interval Note:  Patient has had no further episodes of hematemesis since the initial episode.  Does continue to have occasional heartburn.  No bloody stools.  Hgb has drifted down slightly.  11/22/2020 10:38 AM  [name] Colon  has presented today for surgery, with the diagnosis of Solitary episode hematemesis.  Anemia.  Gastrointestinal metaplasia on EGD at outside hospital in June 2022..  The various methods of treatment have been discussed with the patient and family. After consideration of risks, benefits and other options for treatment, the patient has consented to  Procedure(s): ESOPHAGOGASTRODUODENOSCOPY (EGD) WITH PROPOFOL (N/A) as a surgical intervention.  The patient's history has been reviewed, patient examined, no change in status, stable for surgery.  I have reviewed the patient's chart and labs.  Questions were answered to the patient's satisfaction.     [name]

## 2020-11-22 NOTE — Care Management Important Message (Signed)
Important Message  Patient Details  Name: [name] Colon MRN: [id number] Date of Birth: 03/27/1941   Medicare Important Message Given:  Yes     [name] Circle 11/22/2020, 3:02 PM

## 2020-11-22 NOTE — Progress Notes (Signed)
PROGRESS NOTE    [name] Colon  ELF:[id number] DOB: 1941/01/09 DOA: 11/19/2020 PCP: Leonel Ramsay, MD  Brief Narrative: 80 year old male with history of colon cancer, ESRD on hemodialysis, hep C/cirrhosis, hypertension presented to the ED with chest pain in the setting of missed hemodialysis, he reported periodic chest pain for couple of days, was seen in the emergency room on Monday, felt to be atypical, discharged home was unable to attend his normal dialysis treatment, presented back to the ED on 8/16 late night with worsening shortness of breath, started on BiPAP and nitro drip, was subsequently taken for urgent hemodialysis, while in dialysis had an episode of unresponsiveness while on BiPAP, quickly improved, following this he had a second episode of unresponsiveness during which he bradycardia down to the 30s, was started on CPR, he then vomited blood, he had about 1 minute of CPR before ROSC was achieved, subsequently transferred to stepdown unit  Assessment & Plan:   Volume overload -Secondary to missed hemodialysis -Improved after HD, next dialysis today -Nephrology following -Clinically do not suspect pneumonia, discontinued Unasyn   Syncope -Suspected vasovagal episodes -Bradycardia down to 30s the second episode, CODE BLUE was called, had 1 minute of CPR, ROSC was achieved -Cardiology following -Echo with mild cardiomyopathy and global left ventricular hypokinesis  Chest pain Mildly elevated high-sensitivity troponin -echo with EF of 45-50% with global left ventricular hypokinesis -Cardiology consulting, could have underlying CAD, if symptoms persist recommend pharmacological stress test  ESRD on hemodialysis -Nephrology consulting, HD today  Hematemesis -No further episodes -Continue IV Protonix, gastroenterology consulting, hemoglobin trending down to 7.3 this morning -Plan for endoscopy today -No known varices based on prior endoscopies -Monitor  hemoglobin  Acute blood loss anemia Anemia of chronic disease -Anemia, consistent with chronic disease -Add EPO with HD  Hep C, cirrhosis -Reportedly cured  DVT prophylaxis: SCDs Code Status: Full code Family Communication: son at bedside Disposition Plan:  Status is: Inpatient  Remains inpatient appropriate because:Inpatient level of care appropriate due to severity of illness  Dispo: The patient is from: Home              Anticipated d/c is to: Home              Patient currently is not medically stable to d/c.   Difficult to place patient No   Consultants:  -Cardiology Nephrology GI  Procedures:   Antimicrobials:    Subjective: -Had some chest discomfort earlier this morning, now resolved, denies any dyspnea, n.p.o. for endoscopy today  Objective: Vitals:   11/22/20 0300 11/22/20 0400 11/22/20 0724 11/22/20 0937  BP: 138/60 (!) 148/67 (!) 151/61 (!) 171/57  Pulse: 76   74  Resp: 18 19 17 15   Temp: 98.5 F (36.9 C)  98.6 F (37 C) 98.1 F (36.7 C)  TempSrc: Oral  Oral Oral  SpO2: 96%   100%  Weight:    76.5 kg  Height:    5\' 8"  (1.727 m)    Intake/Output Summary (Last 24 hours) at 11/22/2020 1134 Last data filed at 11/22/2020 0400 Gross per 24 hour  Intake 480 ml  Output 150 ml  Net 330 ml   Filed Weights   11/19/20 2331 11/20/20 2206 11/22/20 0937  Weight: 78.9 kg 76.5 kg 76.5 kg    Examination:  General exam: Pleasant elderly male sitting up in bed, AAOx3, no distress HEENT: No JVD CVS: S1-S2, regular rate rhythm Lungs: Clear bilaterally Abdomen: Soft, nontender, bowel sounds present Extremities: No edema Psych:  Appropriate mood and affect  Data Reviewed:   CBC: Recent Labs  Lab 11/18/20 1146 11/19/20 2341 11/20/20 0006 11/20/20 1018 11/20/20 1455 11/21/20 0441 11/22/20 0044  WBC 9.7 12.6*  --  12.3* 11.6* 11.3* 9.5  NEUTROABS 7.1 10.5*  --   --   --   --   --   HGB 9.5* 8.4* 8.8* 8.2* 7.4* 7.8* 7.3*  HCT 28.6* 25.3* 26.0*  24.3* 22.3* 22.2* 21.7*  MCV 92.6 93.7  --  93.1 92.9 90.6 91.9  PLT 170 180  --  164 166 143* 449*   Basic Metabolic Panel: Recent Labs  Lab 11/18/20 1146 11/19/20 2341 11/20/20 0006 11/20/20 0827 11/21/20 0441 11/22/20 0044  NA 139 137 140 138 138 137  K 4.4 4.1 4.0 4.4 4.1 4.8  CL 104 105 107 106 99 102  CO2 23 21*  --  23 27 26   GLUCOSE 97 179* 166* 113* 82 92  BUN 79* 100* 93* 78* 57* 81*  CREATININE 9.78* 11.39* 11.40* 8.24* 7.72* 9.21*  CALCIUM 9.2 9.1  --  9.2 9.0 8.5*  PHOS  --   --   --  3.5  --   --    GFR: Estimated Creatinine Clearance: 6 mL/min (A) (by C-G formula based on SCr of 9.21 mg/dL (H)). Liver Function Tests: Recent Labs  Lab 11/18/20 1146 11/19/20 2341 11/20/20 0827  AST 18 22  --   ALT 31 26  --   ALKPHOS 116 110  --   BILITOT 0.9 0.8  --   PROT 7.2 6.9  --   ALBUMIN 3.0* 3.0* 2.7*   No results for input(s): LIPASE, AMYLASE in the last 168 hours. No results for input(s): AMMONIA in the last 168 hours. Coagulation Profile: No results for input(s): INR, PROTIME in the last 168 hours. Cardiac Enzymes: No results for input(s): CKTOTAL, CKMB, CKMBINDEX, TROPONINI in the last 168 hours. BNP (last 3 results) No results for input(s): PROBNP in the last 8760 hours. HbA1C: No results for input(s): HGBA1C in the last 72 hours. CBG: Recent Labs  Lab 11/20/20 0947  GLUCAP 129*   Lipid Profile: No results for input(s): CHOL, HDL, LDLCALC, TRIG, CHOLHDL, LDLDIRECT in the last 72 hours. Thyroid Function Tests: No results for input(s): TSH, T4TOTAL, FREET4, T3FREE, THYROIDAB in the last 72 hours. Anemia Panel: No results for input(s): VITAMINB12, FOLATE, FERRITIN, TIBC, IRON, RETICCTPCT in the last 72 hours. Urine analysis: No results found for: COLORURINE, APPEARANCEUR, LABSPEC, PHURINE, GLUCOSEU, HGBUR, BILIRUBINUR, KETONESUR, PROTEINUR, UROBILINOGEN, NITRITE, LEUKOCYTESUR Sepsis Labs: @LABRCNTIP (procalcitonin:4,lacticidven:4)  ) Recent  Results (from the past 240 hour(s))  Resp Panel by RT-PCR (Flu A&B, Covid) Nasopharyngeal Swab     Status: None   Collection Time: 11/20/20 12:02 AM   Specimen: Nasopharyngeal Swab; Nasopharyngeal(NP) swabs in vial transport medium  Result Value Ref Range Status   SARS Coronavirus 2 by RT PCR NEGATIVE NEGATIVE Final    Comment: (NOTE) SARS-CoV-2 target nucleic acids are NOT DETECTED.  The SARS-CoV-2 RNA is generally detectable in upper respiratory specimens during the acute phase of infection. The lowest concentration of SARS-CoV-2 viral copies this assay can detect is 138 copies/mL. A negative result does not preclude SARS-Cov-2 infection and should not be used as the sole basis for treatment or other patient management decisions. A negative result may occur with  improper specimen collection/handling, submission of specimen other than nasopharyngeal swab, presence of viral mutation(s) within the areas targeted by this assay, and inadequate number of viral copies(<138  copies/mL). A negative result must be combined with clinical observations, patient history, and epidemiological information. The expected result is Negative.  Fact Sheet for Patients:  EntrepreneurPulse.com.au  Fact Sheet for Healthcare Providers:  IncredibleEmployment.be  This test is no t yet approved or cleared by the [place] FDA and  has been authorized for detection and/or diagnosis of SARS-CoV-2 by FDA under an Emergency Use Authorization (EUA). This EUA will remain  in effect (meaning this test can be used) for the duration of the COVID-19 declaration under Section 564(b)(1) of the Act, 21 U.S.C.section 360bbb-3(b)(1), unless the authorization is terminated  or revoked sooner.       Influenza A by PCR NEGATIVE NEGATIVE Final   Influenza B by PCR NEGATIVE NEGATIVE Final    Comment: (NOTE) The Xpert Xpress SARS-CoV-2/FLU/RSV plus assay is intended as an aid in the  diagnosis of influenza from Nasopharyngeal swab specimens and should not be used as a sole basis for treatment. Nasal washings and aspirates are unacceptable for Xpert Xpress SARS-CoV-2/FLU/RSV testing.  Fact Sheet for Patients: EntrepreneurPulse.com.au  Fact Sheet for Healthcare Providers: IncredibleEmployment.be  This test is not yet approved or cleared by the [place] FDA and has been authorized for detection and/or diagnosis of SARS-CoV-2 by FDA under an Emergency Use Authorization (EUA). This EUA will remain in effect (meaning this test can be used) for the duration of the COVID-19 declaration under Section 564(b)(1) of the Act, 21 U.S.C. section 360bbb-3(b)(1), unless the authorization is terminated or revoked.  Performed at [facility], [place] [address] [place], [place] [address]   MRSA Next Gen by PCR, Nasal     Status: None   Collection Time: 11/20/20 10:18 AM   Specimen: Nasal Mucosa; Nasal Swab  Result Value Ref Range Status   MRSA by PCR Next Gen NOT DETECTED NOT DETECTED Final    Comment: (NOTE) The GeneXpert MRSA Assay (FDA approved for NASAL specimens only), is one component of a comprehensive MRSA colonization surveillance program. It is not intended to diagnose MRSA infection nor to guide or monitor treatment for MRSA infections. Test performance is not FDA approved in patients less than 2 years old. Performed at [facility], [place] [address], [place], [place] [address]      Scheduled Meds:  [MAR Hold] brinzolamide  1 drop Left Eye BID   And   [MAR Hold] brimonidine  1 drop Left Eye BID   [MAR Hold] Chlorhexidine Gluconate Cloth  6 each Topical Q0600   darbepoetin (ARANESP) injection - DIALYSIS  100 mcg Intravenous Q Fri-HD   [MAR Hold] ferric citrate  420 mg Oral BID WC   [MAR Hold] ketorolac  1 drop Left Eye QID   [MAR Hold] multivitamin  1 tablet Oral Daily   [MAR Hold] ofloxacin  1 drop Left Eye  QID   [MAR Hold] pantoprazole (PROTONIX) IV  40 mg Intravenous Q12H   [MAR Hold] prednisoLONE acetate  1 drop Left Eye QID   [MAR Hold] sodium chloride flush  3 mL Intravenous Q12H   [MAR Hold] sucralfate  1 g Oral TID WC & HS   [MAR Hold] torsemide  10 mg Oral Daily   Continuous Infusions:  sodium chloride 20 mL/hr at 11/22/20 1055   nitroGLYCERIN Stopped (11/21/20 0700)     LOS: 2 days    Time spent: 34min    Domenic Polite, MD [facility]   11/22/2020, 11:34 AM

## 2020-11-22 NOTE — Anesthesia Preprocedure Evaluation (Signed)
Anesthesia Evaluation  Patient identified by MRN, date of birth, ID band Patient awake    Reviewed: Allergy & Precautions, NPO status , Patient's Chart, lab work & pertinent test results  Airway Mallampati: II  TM Distance: >3 FB Neck ROM: Full    Dental no notable dental hx.    Pulmonary neg pulmonary ROS, former smoker,    Pulmonary exam normal breath sounds clear to auscultation       Cardiovascular hypertension, +CHF  Normal cardiovascular exam Rhythm:Regular Rate:Normal     Neuro/Psych negative neurological ROS  negative psych ROS   GI/Hepatic negative GI ROS, (+) Hepatitis -  Endo/Other  negative endocrine ROS  Renal/GU Renal InsufficiencyRenal disease  negative genitourinary   Musculoskeletal negative musculoskeletal ROS (+)   Abdominal   Peds negative pediatric ROS (+)  Hematology negative hematology ROS (+)   Anesthesia Other Findings   Reproductive/Obstetrics negative OB ROS                             Anesthesia Physical Anesthesia Plan  ASA: 3  Anesthesia Plan: MAC   Post-op Pain Management:    Induction: Intravenous  PONV Risk Score and Plan: 1 and Ondansetron and Treatment may vary due to age or medical condition  Airway Management Planned: Nasal Cannula  Additional Equipment:   Intra-op Plan:   Post-operative Plan:   Informed Consent: I have reviewed the patients History and Physical, chart, labs and discussed the procedure including the risks, benefits and alternatives for the proposed anesthesia with the patient or authorized representative who has indicated his/her understanding and acceptance.     Dental advisory given  Plan Discussed with: CRNA  Anesthesia Plan Comments:         Anesthesia Quick Evaluation

## 2020-11-22 NOTE — Progress Notes (Addendum)
Progress Note  Patient Name: [name] Colon Date of Encounter: 11/22/2020  [facility] Cardiologist: None   Subjective   Had some mild indigestion overnight.  No shortness of breath.  Awaiting EGD.  Inpatient Medications    Scheduled Meds:  brinzolamide  1 drop Left Eye BID   And   brimonidine  1 drop Left Eye BID   Chlorhexidine Gluconate Cloth  6 each Topical Q0600   ferric citrate  420 mg Oral BID WC   ketorolac  1 drop Left Eye QID   multivitamin  1 tablet Oral Daily   ofloxacin  1 drop Left Eye QID   pantoprazole (PROTONIX) IV  40 mg Intravenous Q12H   prednisoLONE acetate  1 drop Left Eye QID   sodium chloride flush  3 mL Intravenous Q12H   sucralfate  1 g Oral TID WC & HS   torsemide  10 mg Oral Daily   Continuous Infusions:  nitroGLYCERIN Stopped (11/21/20 0700)   PRN Meds: acetaminophen **OR** acetaminophen, calcium carbonate (dosed in mg elemental calcium), camphor-menthol **AND** hydrOXYzine, docusate sodium, feeding supplement (NEPRO CARB STEADY), hydrALAZINE, lidocaine (PF), lidocaine-prilocaine, ondansetron **OR** ondansetron (ZOFRAN) IV, pentafluoroprop-tetrafluoroeth, sorbitol, zolpidem   Vital Signs    Vitals:   11/22/20 0200 11/22/20 0300 11/22/20 0400 11/22/20 0724  BP: 138/60 138/60 (!) 148/67 (!) 151/61  Pulse:  76    Resp: 18 18 19 17   Temp:  98.5 F (36.9 C)  98.6 F (37 C)  TempSrc:  Oral  Oral  SpO2:  96%    Weight:      Height:        Intake/Output Summary (Last 24 hours) at 11/22/2020 0851 Last data filed at 11/22/2020 0400 Gross per 24 hour  Intake 480 ml  Output 150 ml  Net 330 ml   Last 3 Weights 11/20/2020 11/20/2020 11/19/2020  Weight (lbs) 168 lb 10.4 oz (No Data) 174 lb  Weight (kg) 76.5 kg (No Data) 78.926 kg      Telemetry    No adverse arrhythmias- Personally Reviewed  ECG    No new- Personally Reviewed  Physical Exam   GEN: No acute distress.   Neck: No JVD Cardiac: RRR, no murmurs, rubs, or gallops.   Respiratory: Clear to auscultation bilaterally. GI: Soft, nontender, non-distended  MS: No edema; No deformity. Neuro:  Nonfocal  Psych: Normal affect   Labs    High Sensitivity Troponin:   Recent Labs  Lab 11/18/20 1146 11/18/20 1520 11/19/20 2341 11/20/20 0155  TROPONINIHS 44* 45* 84* 102*      Chemistry Recent Labs  Lab 11/18/20 1146 11/19/20 2341 11/20/20 0006 11/20/20 0827 11/21/20 0441 11/22/20 0044  NA 139 137   < > 138 138 137  K 4.4 4.1   < > 4.4 4.1 4.8  CL 104 105   < > 106 99 102  CO2 23 21*  --  23 27 26   GLUCOSE 97 179*   < > 113* 82 92  BUN 79* 100*   < > 78* 57* 81*  CREATININE 9.78* 11.39*   < > 8.24* 7.72* 9.21*  CALCIUM 9.2 9.1  --  9.2 9.0 8.5*  PROT 7.2 6.9  --   --   --   --   ALBUMIN 3.0* 3.0*  --  2.7*  --   --   AST 18 22  --   --   --   --   ALT 31 26  --   --   --   --  ALKPHOS 116 110  --   --   --   --   BILITOT 0.9 0.8  --   --   --   --   GFRNONAA 5* 4*  --  6* 6* 5*  ANIONGAP 12 11  --  9 12 9    < > = values in this interval not displayed.     Hematology Recent Labs  Lab 11/20/20 1455 11/21/20 0441 11/22/20 0044  WBC 11.6* 11.3* 9.5  RBC 2.40* 2.45* 2.36*  HGB 7.4* 7.8* 7.3*  HCT 22.3* 22.2* 21.7*  MCV 92.9 90.6 91.9  MCH 30.8 31.8 30.9  MCHC 33.2 35.1 33.6  RDW 14.6 14.8 15.2  PLT 166 143* 136*    BNPNo results for input(s): BNP, PROBNP in the last 168 hours.   DDimer No results for input(s): DDIMER in the last 168 hours.   Radiology    ECHOCARDIOGRAM COMPLETE  Result Date: 11/20/2020    ECHOCARDIOGRAM REPORT   Patient Name:   [name] Colon Date of Exam: 11/20/2020 Medical Rec #:  [id number]        Height:       68.0 in Accession #:    [id number]       Weight:       174.0 lb Date of Birth:  Jun 02, 1940         BSA:          1.926 m Patient Age:    80 years         BP:           159/60 mmHg Patient Gender: M                HR:           71 bpm. Exam Location:  Inpatient Procedure: 2D Echo, Cardiac Doppler and  Color Doppler                          STAT ECHO Reported to: Dr Feliberto Gottron on 11/20/2020 11:08:00 AM. Indications:    Dyspnea R06.00  History:        Patient has no prior history of Echocardiogram examinations.                 Renal Disorder.  Sonographer:    Tiffany Dance RVT Referring Phys: [place]  1. Endocardial border tracking is poor resulting in inaccurate automated LVEF assessment. Mild, global hypokinesis. Left ventricular ejection fraction, by estimation, is 45 to 50%. The left ventricle has mildly decreased function. The left ventricle demonstrates global hypokinesis. Left ventricular diastolic parameters are consistent with Grade I diastolic dysfunction (impaired relaxation). Elevated left ventricular end-diastolic pressure.  2. Right ventricular systolic function is normal. The right ventricular size is normal. There is moderately elevated pulmonary artery systolic pressure.  3. Left atrial size was moderately dilated.  4. The mitral valve is normal in structure. Mild mitral valve regurgitation. No evidence of mitral stenosis.  5. The aortic valve is tricuspid. There is mild calcification of the aortic valve. There is mild thickening of the aortic valve. Aortic valve regurgitation is mild. No aortic stenosis is present.  6. Aortic dilatation noted. There is borderline dilatation of the aortic root, measuring 36 mm. There is mild dilatation of the ascending aorta, measuring 37 mm.  7. The inferior vena cava is normal in size with greater than 50% respiratory variability, suggesting right atrial pressure of 3 mmHg. FINDINGS  Left Ventricle:  Endocardial border tracking is poor resulting in inaccurate automated LVEF assessment. Mild, global hypokinesis. Left ventricular ejection fraction, by estimation, is 45 to 50%. The left ventricle has mildly decreased function. The left ventricle demonstrates global hypokinesis. The left ventricular internal cavity size was normal in  size. There is no left ventricular hypertrophy. Left ventricular diastolic parameters are consistent with Grade I diastolic dysfunction (impaired relaxation). Elevated left ventricular end-diastolic pressure. Right Ventricle: The right ventricular size is normal. No increase in right ventricular wall thickness. Right ventricular systolic function is normal. There is moderately elevated pulmonary artery systolic pressure. The tricuspid regurgitant velocity is 3.39 m/s, and with an assumed right atrial pressure of 3 mmHg, the estimated right ventricular systolic pressure is 78.2 mmHg. Left Atrium: Left atrial size was moderately dilated. Right Atrium: Right atrial size was normal in size. Pericardium: There is no evidence of pericardial effusion. Mitral Valve: The mitral valve is normal in structure. Mild mitral valve regurgitation. No evidence of mitral valve stenosis. Tricuspid Valve: The tricuspid valve is normal in structure. Tricuspid valve regurgitation is trivial. No evidence of tricuspid stenosis. Aortic Valve: The aortic valve is tricuspid. There is mild calcification of the aortic valve. There is mild thickening of the aortic valve. Aortic valve regurgitation is mild. No aortic stenosis is present. Pulmonic Valve: The pulmonic valve was normal in structure. Pulmonic valve regurgitation is not visualized. No evidence of pulmonic stenosis. Aorta: Aortic dilatation noted. There is borderline dilatation of the aortic root, measuring 36 mm. There is mild dilatation of the ascending aorta, measuring 37 mm. Venous: The inferior vena cava is normal in size with greater than 50% respiratory variability, suggesting right atrial pressure of 3 mmHg. IAS/Shunts: No atrial level shunt detected by color flow Doppler.  LEFT VENTRICLE PLAX 2D LVIDd:         4.60 cm  Diastology LVIDs:         3.50 cm  LV e' medial:    6.42 cm/s LV PW:         1.30 cm  LV E/e' medial:  18.7 LV IVS:        1.00 cm  LV e' lateral:   7.18 cm/s  LVOT diam:     2.00 cm  LV E/e' lateral: 16.7 LV SV:         75 LV SV Index:   39 LVOT Area:     3.14 cm  RIGHT VENTRICLE             IVC RV Basal diam:  3.70 cm     IVC diam: 1.80 cm RV Mid diam:    2.40 cm RV S prime:     17.40 cm/s TAPSE (M-mode): 2.1 cm LEFT ATRIUM             Index       RIGHT ATRIUM           Index LA diam:        3.60 cm 1.87 cm/m  RA Area:     18.10 cm LA Vol (A2C):   89.1 ml 46.25 ml/m RA Volume:   52.50 ml  27.25 ml/m LA Vol (A4C):   56.5 ml 29.33 ml/m LA Biplane Vol: 72.2 ml 37.48 ml/m  AORTIC VALVE LVOT Vmax:   121.00 cm/s LVOT Vmean:  78.500 cm/s LVOT VTI:    0.238 m  AORTA Ao Root diam: 3.60 cm Ao Asc diam:  3.70 cm MITRAL VALVE  TRICUSPID VALVE MV Area (PHT): 3.70 cm     TR Peak grad:   46.0 mmHg MV Decel Time: 205 msec     TR Vmax:        339.00 cm/s MV E velocity: 120.00 cm/s MV A velocity: 108.00 cm/s  SHUNTS MV E/A ratio:  1.11         Systemic VTI:  0.24 m                             Systemic Diam: 2.00 cm Skeet Latch MD Electronically signed by Skeet Latch MD Signature Date/Time: 11/20/2020/11:36:18 AM    Final     Cardiac Studies   Echocardiogram this admission  1. Endocardial border tracking is poor resulting in inaccurate automated  LVEF assessment. Mild, global hypokinesis. Left ventricular ejection  fraction, by estimation, is 45 to 50%. The left ventricle has mildly  decreased function. The left ventricle  demonstrates global hypokinesis. Left ventricular diastolic parameters are  consistent with Grade I diastolic dysfunction (impaired relaxation).  Elevated left ventricular end-diastolic pressure.   2. Right ventricular systolic function is normal. The right ventricular  size is normal. There is moderately elevated pulmonary artery systolic  pressure.   3. Left atrial size was moderately dilated.   4. The mitral valve is normal in structure. Mild mitral valve  regurgitation. No evidence of mitral stenosis.   5. The aortic  valve is tricuspid. There is mild calcification of the  aortic valve. There is mild thickening of the aortic valve. Aortic valve  regurgitation is mild. No aortic stenosis is present.   6. Aortic dilatation noted. There is borderline dilatation of the aortic  root, measuring 36 mm. There is mild dilatation of the ascending aorta,  measuring 37 mm.   7. The inferior vena cava is normal in size with greater than 50%  respiratory variability, suggesting right atrial pressure of 3 mmHg.  Patient Profile     80 y.o. male with what appears to be vasovagal syncope during hemodialysis with mildly reduced ejection fraction 45 to 50%  Assessment & Plan    Vasovagal syncope - This was accompanied by burning indigestion-like discomfort, nausea, hematemesis.  Undergoing EGD today by GI.  Chronic systolic heart failure - Mildly reduced ejection fraction 45 to 50% - Not a candidate for ACE inhibitor or Entresto spironolactone Jardiance given his end-stage renal disease status. - We are also holding off of AV nodal blocking agents such as metoprolol given his prior vasovagal syncope.  Anemia of renal disease - Hemoglobin 7.3.  He is Jehovah's Witness.  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  For questions or updates, please contact [place] Please consult [email or web address] for contact info under        Signed, Candee Furbish, MD  11/22/2020, 8:51 AM

## 2020-11-22 NOTE — Anesthesia Postprocedure Evaluation (Signed)
Anesthesia Post Note  Patient: Haward Pope  Procedure(s) Performed: ESOPHAGOGASTRODUODENOSCOPY (EGD) WITH PROPOFOL HEMOSTASIS CLIP PLACEMENT     Patient location during evaluation: PACU Anesthesia Type: MAC Level of consciousness: awake and alert Pain management: pain level controlled Vital Signs Assessment: post-procedure vital signs reviewed and stable Respiratory status: spontaneous breathing, nonlabored ventilation and respiratory function stable Cardiovascular status: stable and blood pressure returned to baseline Anesthetic complications: no   No notable events documented.  Last Vitals:  Vitals:   11/22/20 1215 11/22/20 1220  BP:  (!) 146/60  Pulse: 61 61  Resp: 16 13  Temp:  36.6 C  SpO2: 100% 99%    Last Pain:  Vitals:   11/22/20 1220  TempSrc: Oral  PainSc:                  Audry Pili

## 2020-11-23 DIAGNOSIS — N186 End stage renal disease: Secondary | ICD-10-CM | POA: Diagnosis not present

## 2020-11-23 DIAGNOSIS — B182 Chronic viral hepatitis C: Secondary | ICD-10-CM | POA: Diagnosis not present

## 2020-11-23 DIAGNOSIS — R55 Syncope and collapse: Secondary | ICD-10-CM | POA: Diagnosis not present

## 2020-11-23 DIAGNOSIS — K7460 Unspecified cirrhosis of liver: Secondary | ICD-10-CM | POA: Diagnosis not present

## 2020-11-23 DIAGNOSIS — I5043 Acute on chronic combined systolic (congestive) and diastolic (congestive) heart failure: Secondary | ICD-10-CM | POA: Diagnosis not present

## 2020-11-23 LAB — CBC
HCT: 20.3 % — ABNORMAL LOW (ref 39.0–52.0)
Hemoglobin: 6.7 g/dL — CL (ref 13.0–17.0)
MCH: 30.3 pg (ref 26.0–34.0)
MCHC: 33 g/dL (ref 30.0–36.0)
MCV: 91.9 fL (ref 80.0–100.0)
Platelets: 119 10*3/uL — ABNORMAL LOW (ref 150–400)
RBC: 2.21 MIL/uL — ABNORMAL LOW (ref 4.22–5.81)
RDW: 15.1 % (ref 11.5–15.5)
WBC: 11.9 10*3/uL — ABNORMAL HIGH (ref 4.0–10.5)
nRBC: 0 % (ref 0.0–0.2)

## 2020-11-23 LAB — IRON AND TIBC
Iron: 28 ug/dL — ABNORMAL LOW (ref 45–182)
Saturation Ratios: 13 % — ABNORMAL LOW (ref 17.9–39.5)
TIBC: 220 ug/dL — ABNORMAL LOW (ref 250–450)
UIBC: 192 ug/dL

## 2020-11-23 LAB — FERRITIN: Ferritin: 167 ng/mL (ref 24–336)

## 2020-11-23 MED ORDER — SODIUM CHLORIDE 0.9 % IV SOLN
510.0000 mg | Freq: Once | INTRAVENOUS | Status: AC
  Administered 2020-11-23: 510 mg via INTRAVENOUS
  Filled 2020-11-23: qty 17

## 2020-11-23 MED ORDER — POLYETHYLENE GLYCOL 3350 17 G PO PACK
17.0000 g | PACK | Freq: Every day | ORAL | Status: DC
  Administered 2020-11-27 – 2020-12-03 (×5): 17 g via ORAL
  Filled 2020-11-23 (×8): qty 1

## 2020-11-23 NOTE — Evaluation (Signed)
Physical Therapy Evaluation Patient Details Name: [name] Colon MRN: [id number] DOB: 22-May-1940 Today's Date: 11/23/2020   History of Present Illness  Pt is an 80 year old man who came to the ED on 11/18/20 with chest pain, but tests were negative. He returned on 11/19/20 with chest pain, SOB requring bipap and nitroglycerin after missed HD session. Hospital course complicated by 2 episodes of unresponsiveness, CPR and vomiting blood associated during HD. PMH: colon cancer, ESRD, hep C, cirrhosis, HTN.   Clinical Impression  Pt in bed upon arrival of PT, agreeable to evaluation at this time. Prior to admission the pt was ambulating with use of SPC within the home, mostly independent with ADLs, and needing some assist from family for IADLS. The pt now presents with limitations in functional mobility and activity tolerance due to above dx, and will continue to benefit from skilled PT to address these deficits. He was able to complete bed mobility and initial transfers with minG to minA for safety, but was limited in further mobility/gait due to orthostatic hypotension at this time. Will continue to follow acutely and progress mobility as tolerated.   VITALS:  - supine - BP: 154/63 - sitting EOB - BP: 143/69 (91);  - standing - BP: 98/51 (65);  - sitting EOB - 119/57 (68);       Follow Up Recommendations Home health PT;Supervision for mobility/OOB (vs no PT pending progression)    Equipment Recommendations  None recommended by PT    Recommendations for Other Services       Precautions / Restrictions Precautions Precautions: Fall Precaution Comments: watch orthostatics Restrictions Weight Bearing Restrictions: No      Mobility  Bed Mobility Overal bed mobility: Needs Assistance Bed Mobility: Supine to Sit     Supine to sit: Supervision     General bed mobility comments: supervision for lines    Transfers Overall transfer level: Needs assistance Equipment used: 1 person  hand held assist Transfers: Sit to/from Stand;Stand Pivot Transfers Sit to Stand: Min guard Stand pivot transfers: Min assist       General transfer comment: minG-light minA to steady with initial stand, HHA for pivoting steps to recliner. Pt witih drop in BP in standing from 143/69 (91) to 98/51 (65)  Ambulation/Gait             General Gait Details: pt took 2 small lateral steps to recliner with HHA of 1, gait deferred due to drop in BP with standing     Balance Overall balance assessment: Needs assistance Sitting-balance support: No upper extremity supported Sitting balance-Leahy Scale: Good     Standing balance support: Single extremity supported Standing balance-Leahy Scale: Fair Standing balance comment: min guard assist in static standing, min assist to transfer                             Pertinent Vitals/Pain Pain Assessment: No/denies pain    Home Living Family/patient expects to be discharged to:: Private residence Living Arrangements: Children (son and daughter) Available Help at Discharge: Family;Available 24 hours/day Type of Home: House Home Access: Stairs to enter Entrance Stairs-Rails: Psychiatric nurse of Steps: 4 Home Layout: One level;Other (Comment) (walk-out basement) Home Equipment: Shower seat;Cane - single point;Walker - 2 wheels;Grab bars - tub/shower;Hand held shower head      Prior Function Level of Independence: Needs assistance   Gait / Transfers Assistance Needed: walks with a cane household distances and in the yard  ADL's / Homemaking Assistance Needed: modified independent in self care, assisted for IADL, reports he works with his family on meal prep and houskeeping.        Hand Dominance   Dominant Hand: Right    Extremity/Trunk Assessment   Upper Extremity Assessment Upper Extremity Assessment: Defer to OT evaluation    Lower Extremity Assessment Lower Extremity Assessment: Overall WFL for  tasks assessed    Cervical / Trunk Assessment Cervical / Trunk Assessment: Kyphotic  Communication   Communication: No difficulties  Cognition Arousal/Alertness: Awake/alert Behavior During Therapy: WFL for tasks assessed/performed Overall Cognitive Status: Within Functional Limits for tasks assessed                                 General Comments: pt pleasant and making appropriate conversation      General Comments General comments (skin integrity, edema, etc.): BP from 154/63  in supine to 98/51 (65) in standing with pt symptomatic, sx resolved after sitting    Exercises     Assessment/Plan    PT Assessment Patient needs continued PT services  PT Problem List Decreased activity tolerance;Decreased balance;Decreased mobility       PT Treatment Interventions DME instruction;Stair training;Gait training;Functional mobility training;Therapeutic activities;Therapeutic exercise;Balance training;Patient/family education    PT Goals (Current goals can be found in the Care Plan section)  Acute Rehab PT Goals Patient Stated Goal: return home PT Goal Formulation: With patient Time For Goal Achievement: 12/07/20 Potential to Achieve Goals: Good    Frequency Min 3X/week   Barriers to discharge        Co-evaluation PT/OT/SLP Co-Evaluation/Treatment: Yes Reason for Co-Treatment: For patient/therapist safety;To address functional/ADL transfers PT goals addressed during session: Mobility/safety with mobility;Balance OT goals addressed during session: ADL's and self-care       AM-PAC PT "6 Clicks" Mobility  Outcome Measure Help needed turning from your back to your side while in a flat bed without using bedrails?: A Little Help needed moving from lying on your back to sitting on the side of a flat bed without using bedrails?: A Little Help needed moving to and from a bed to a chair (including a wheelchair)?: A Little Help needed standing up from a chair using  your arms (e.g., wheelchair or bedside chair)?: A Little Help needed to walk in hospital room?: A Lot Help needed climbing 3-5 steps with a railing? : A Lot 6 Click Score: 16    End of Session Equipment Utilized During Treatment: Gait belt Activity Tolerance: Patient tolerated treatment well;Treatment limited secondary to medical complications (Comment) (orthostatic) Patient left: in chair;with call bell/phone within reach;with chair alarm set Nurse Communication: Mobility status PT Visit Diagnosis: Other abnormalities of gait and mobility (R26.89)    Time: 4742-5956 PT Time Calculation (min) (ACUTE ONLY): 24 min   Charges:   PT Evaluation $PT Eval Low Complexity: 1 Low          Desere Gwin Allen Kell, PT, DPT   Acute Rehabilitation Department Pager #: [phone]  Sandra Cockayne 11/23/2020, 11:58 AM

## 2020-11-23 NOTE — Plan of Care (Signed)

## 2020-11-23 NOTE — Progress Notes (Signed)
RN Velna Hatchet made aware of Hgb of 6.7. Patient is a Jehovah witness.

## 2020-11-23 NOTE — Evaluation (Signed)
Occupational Therapy Evaluation Patient Details Name: [name] Colon MRN: [id number] DOB: [birth year] Today's Date: 11/23/2020    History of Present Illness Pt is an 80 year old man who came to the ED on 11/18/20 with chest pain, but tests were negative. He returned on 11/19/20 with chest pain, SOB requring bipap and nitroglycerin after missed HD session. Hospital course complicated by 2 episodes of unresponsiveness, CPR and vomiting blood associated during HD. PMH: colon cancer, ESRD, hep C, cirrhosis, HTN.   Clinical Impression   Pt walks with a cane and is modified independent in self care at his baseline. He works with his son and daughter, with whom he lives, on IADL. Pt presents with symptomatic orthostatic hypotension: Supine: 154/75 Sitting: 143/69 (91) Standing: 98/51 (65) Min assist needed for transfers for stability. Ambulation was deferred due to hypotension. Pt requires set up to min assist for ADL. Will follow acutely. Pt likely to progress well once medical issues resolve.     Follow Up Recommendations  No OT follow up    Equipment Recommendations  None recommended by OT    Recommendations for Other Services       Precautions / Restrictions Precautions Precautions: Fall Precaution Comments: watch orthostatics      Mobility Bed Mobility Overal bed mobility: Needs Assistance Bed Mobility: Supine to Sit     Supine to sit: Supervision     General bed mobility comments: supervision for lines    Transfers Overall transfer level: Needs assistance   Transfers: Sit to/from Stand;Stand Pivot Transfers Sit to Stand: Min guard Stand pivot transfers: Min assist       General transfer comment: steadying assist, pt with lightheadedness in standing, + orthostatic hypotension    Balance Overall balance assessment: Needs assistance   Sitting balance-Leahy Scale: Good       Standing balance-Leahy Scale: Fair Standing balance comment: min guard assist in static  standing, min assist to transfer                           ADL either performed or assessed with clinical judgement   ADL Overall ADL's : Needs assistance/impaired Eating/Feeding: NPO   Grooming: Set up;Sitting   Upper Body Bathing: Supervision/ safety;Sitting   Lower Body Bathing: Sit to/from stand;Minimal assistance   Upper Body Dressing : Set up;Sitting   Lower Body Dressing: Minimal assistance;Sit to/from stand   Toilet Transfer: Minimal assistance;Stand-pivot;BSC   Toileting- Clothing Manipulation and Hygiene: Minimal assistance;Sit to/from stand               Vision Baseline Vision/History: Wears glasses Wears Glasses: At all times Patient Visual Report: No change from baseline       Perception     Praxis      Pertinent Vitals/Pain Pain Assessment: No/denies pain     Hand Dominance Right   Extremity/Trunk Assessment Upper Extremity Assessment Upper Extremity Assessment: Overall WFL for tasks assessed   Lower Extremity Assessment Lower Extremity Assessment: Defer to PT evaluation   Cervical / Trunk Assessment Cervical / Trunk Assessment: Kyphotic   Communication Communication Communication: No difficulties   Cognition Arousal/Alertness: Awake/alert Behavior During Therapy: WFL for tasks assessed/performed Overall Cognitive Status: Within Functional Limits for tasks assessed                                     General Comments  Exercises     Shoulder Instructions      Home Living Family/patient expects to be discharged to:: Private residence Living Arrangements: Children (son and daughter) Available Help at Discharge: Family;Available 24 hours/day Type of Home: House Home Access: Stairs to enter [employer] of Steps: 4 Entrance Stairs-Rails: Right;Left Home Layout: One level;Other (Comment) (with a walk out basement)     Bathroom Shower/Tub: Tub/shower unit   Bathroom Toilet: Handicapped  height     Home Equipment: Shower seat;Cane - single point;Walker - 2 wheels;Grab bars - tub/shower;Hand held shower head          Prior Functioning/Environment Level of Independence: Needs assistance  Gait / Transfers Assistance Needed: walks with a cane household distances and in the yard ADL's / Homemaking Assistance Needed: modified independent in self care, assisted for IADL, reports he works with his family on meal prep and houskeeping.            OT Problem List: Decreased activity tolerance;Decreased strength;Impaired balance (sitting and/or standing);Cardiopulmonary status limiting activity      OT Treatment/Interventions: Self-care/ADL training;Patient/family education;Balance training;Therapeutic activities    OT Goals(Current goals can be found in the care plan section) Acute Rehab OT Goals Patient Stated Goal: return home OT Goal Formulation: With patient Time For Goal Achievement: 12/07/20 Potential to Achieve Goals: Good ADL Goals Pt Will Perform Grooming: with supervision;standing Pt Will Perform Lower Body Bathing: with supervision;sit to/from stand Pt Will Perform Lower Body Dressing: with supervision;sit to/from stand Pt Will Transfer to Toilet: with supervision;ambulating Pt Will Perform Toileting - Clothing Manipulation and hygiene: with supervision;sit to/from stand  OT Frequency: Min 2X/week   Barriers to D/C:            Co-evaluation PT/OT/SLP Co-Evaluation/Treatment: Yes Reason for Co-Treatment: For patient/therapist safety   OT goals addressed during session: ADL's and self-care      AM-PAC OT "6 Clicks" Daily Activity     Outcome Measure Help from another person eating meals?: None Help from another person taking care of personal grooming?: A Little Help from another person toileting, which includes using toliet, bedpan, or urinal?: A Little Help from another person bathing (including washing, rinsing, drying)?: A Little Help from  another person to put on and taking off regular upper body clothing?: None Help from another person to put on and taking off regular lower body clothing?: A Little 6 Click Score: 20   End of Session Equipment Utilized During Treatment: Gait belt  Activity Tolerance: Treatment limited secondary to medical complications (Comment) (pt with symptomatice orthostatic hypotension) Patient left: in chair;with call bell/phone within reach;with chair alarm set  OT Visit Diagnosis: Unsteadiness on feet (R26.81);Other abnormalities of gait and mobility (R26.89)                Time: 0300-9233 OT Time Calculation (min): 25 min Charges:  OT General Charges $OT Visit: 1 Visit OT Evaluation $OT Eval Moderate Complexity: [place], OTR/L Acute Rehabilitation Services Pager: [phone] Office: [phone]  Malka So 11/23/2020, 11:07 AM

## 2020-11-23 NOTE — Progress Notes (Signed)
[facility] Progress Note   Assessment/ Plan:   1 SOB/ respiratory distress: missed HD on Monday- likely volume overload.  Possbily aspirated during vomiting episode on BiPaP so on Unasyn too.  Improved yesterday after HD 2.  Suspected UGIB: no documented varices 03/2020 in [facility]'s system.  GI consulting--> s/p EGD 8/19 with some bleeding ulcers, clipped.  Pt is a Jehovah's witness--> does NOT want blood products.  Would use caution with carafate in ESRD (can cause aluminum toxicity). 3.  Chest pain/ vasovagal syncope- cardiology c/s, appreciate assistance.  Interestingly, pt was prescribed Simbriza eye gtts from a cataract surgery last week--> Has A2 agonist properties so ? 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  HD on schedule, got it 11/22/20, next planned for Monday 5 Hypertension: not on any BP meds as OP per chart- pt's dtr says he's on amlodipine 10 mg daily.  On torsemide 20 mg on non-dialysis days. 6 Anemia of ESRD:  Hgb dropped in setting of GI bleed, got Aranesp 100 mcg 11/22/20 with HD, will order feraheme today in setting of Tsat 13% 7. Metabolic Bone Disease: calcitriol with rx, Auryxia as binder 8.  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  H/o prostate cancer 10: h/o colon cancer 11: Dispo: OK from renal perspective to go as long as he gets his feraheme beforehand  Subjective:    Endoscopy with some bleeding ulcers ? M-W tears?  Clipped.  Hgb 6.7 today.  Feels OK.  Got aranesp with HD yesterday.     Objective:   BP (!) 135/52 (BP Location: Right Arm)   Pulse 68   Temp 98 F (36.7 C) (Oral)   Resp 18   Ht 5\' 8"  (1.727 m)   Wt 77 kg   SpO2 98%   BMI 25.81 kg/m   Physical Exam: GEN sitting in chair, appears well HEENT EOMI PERRL NECK no JVD PULM clear  CV RRR ABD soft, mildly distended, improved EXT no real LE edema NEURO AAO x 3 nonfocal ACCESS: R IJ [facility]  Labs: BMET Recent Labs   Lab 11/18/20 1146 11/19/20 2341 11/20/20 0006 11/20/20 0827 11/21/20 0441 11/22/20 0044 11/23/20 0107  NA 139 137 140 138 138 137 133*  K 4.4 4.1 4.0 4.4 4.1 4.8 4.1  CL 104 105 107 106 99 102 97*  CO2 23 21*  --  23 27 26 24   GLUCOSE 97 179* 166* 113* 82 92 86  BUN 79* 100* 93* 78* 57* 81* 46*  CREATININE 9.78* 11.39* 11.40* 8.24* 7.72* 9.21* 5.83*  CALCIUM 9.2 9.1  --  9.2 9.0 8.5* 8.4*  PHOS  --   --   --  3.5  --   --   --    CBC Recent Labs  Lab 11/18/20 1146 11/19/20 2341 11/20/20 0006 11/20/20 1455 11/21/20 0441 11/22/20 0044 11/23/20 0107  WBC 9.7 12.6*   < > 11.6* 11.3* 9.5 11.9*  NEUTROABS 7.1 10.5*  --   --   --   --   --   HGB 9.5* 8.4*   < > 7.4* 7.8* 7.3* 6.7*  HCT 28.6* 25.3*   < > 22.3* 22.2* 21.7* 20.3*  MCV 92.6 93.7   < > 92.9 90.6 91.9 91.9  PLT 170 180   < > 166 143* 136* 119*   < > = values in this interval not displayed.      Medications:  brinzolamide  1 drop Left Eye BID   And   brimonidine  1 drop Left Eye BID   Chlorhexidine Gluconate Cloth  6 each Topical Q0600   darbepoetin (ARANESP) injection - DIALYSIS  100 mcg Intravenous Q Fri-HD   ferric citrate  420 mg Oral BID WC   ketorolac  1 drop Left Eye QID   mouth rinse  15 mL Mouth Rinse BID   multivitamin  1 tablet Oral Daily   ofloxacin  1 drop Left Eye QID   pantoprazole (PROTONIX) IV  40 mg Intravenous Q12H   polyethylene glycol  17 g Oral Daily   prednisoLONE acetate  1 drop Left Eye QID   sodium chloride flush  3 mL Intravenous Q12H   sucralfate  1 g Oral TID WC & HS   torsemide  10 mg Oral Daily     Madelon Lips MD 11/23/2020, 8:58 AM

## 2020-11-23 NOTE — Progress Notes (Signed)
   Progress Note  Patient Name: [name] Colon Date of Encounter: 11/23/2020  Primary Cardiologist: Candee Furbish, MD  Chart reviewed as well as prior rounding note by Dr. Marlou Porch.  Telemetry shows sinus rhythm with rare PVC, no bradycardia.  Medications reviewed, currently on Demadex 10 mg daily.  Was on Norvasc 10 mg daily at home.  Systolics running 161-096 at this point.  Echocardiogram from August 17 showed LVEF 45 to 50% with mild global hypokinesis and normal RV contraction.  Doubt that Simbrinza eyedrops significantly contributed to his bradycardia, alpha agonist would be more likely to increase blood pressure.  Still suspect vasovagal etiology for bradycardia.  Signed, Rozann Lesches, MD  11/23/2020, 10:18 AM

## 2020-11-23 NOTE — Progress Notes (Signed)
[facility] ROUNDING NOTE   Subjective: No acute events overnight.  No heartburn/chest pain.  No e/o bleeding.  Hgb dipped slightly, not unexpected given the bleeding noted on his EGD.  BUN 46 from 81.   Objective: Vital signs in last 24 hours: Temp:  [97.9 F (36.6 C)-98.4 F (36.9 C)] 98 F (36.7 C) (08/20 0733) Pulse Rate:  [60-101] 68 (08/20 0733) Resp:  [13-23] 18 (08/20 0733) BP: (91-198)/(47-102) 135/52 (08/20 0733) SpO2:  [95 %-100 %] 98 % (08/20 0733) Weight:  [76.5 kg-77 kg] 77 kg (08/20 0520) Last BM Date: 11/20/20 (per PT) General: NAD Lungs:  CTA b/l, no w/r/r Heart:  RRR, no m/r/g Abdomen:  Soft, NT, ND, +BS    Intake/Output from previous day: 08/19 0701 - 08/20 0700 In: 300 [I.V.:300] Out: 200 [Urine:200] Intake/Output this shift: No intake/output data recorded.   Lab Results: Recent Labs    11/21/20 0441 11/22/20 0044 11/23/20 0107  WBC 11.3* 9.5 11.9*  HGB 7.8* 7.3* 6.7*  PLT 143* 136* 119*  MCV 90.6 91.9 91.9   BMET Recent Labs    11/21/20 0441 11/22/20 0044 11/23/20 0107  NA 138 137 133*  K 4.1 4.8 4.1  CL 99 102 97*  CO2 27 26 24   GLUCOSE 82 92 86  BUN 57* 81* 46*  CREATININE 7.72* 9.21* 5.83*  CALCIUM 9.0 8.5* 8.4*   LFT No results for input(s): PROT, ALBUMIN, AST, ALT, ALKPHOS, BILITOT, BILIDIR, IBILI in the last 72 hours. PT/INR No results for input(s): INR in the last 72 hours.    Imaging/Other results: No results found.    Assessment and Plan:  80 y/o m with ESRD on HD, admitted with hypervolemia and hematemesis in setting of vasovagal syncope, found to have ulcerated lesions at [facility], most likely Mallory Weiss tears, treated with hemoclips x 3.  No overt GI bleeding, hgb dropped slightly, BUN improved.  - Ok to advance to clear liquid diet - Continue BID PPI/carafate - Pt has not had BM recently, stated that his nephrologist would order laxative; expect bowel movement to be black - Hopefully advance  diet tomorrow if hgb.  Addition of aranesp yesterday hopefully will help anemia.     [name], MD  11/23/2020, 8:49 AM [facility]

## 2020-11-23 NOTE — Progress Notes (Addendum)
PROGRESS NOTE    [name] Colon  DGU:[id number] DOB: 1941/03/24 DOA: 11/19/2020 PCP: Leonel Ramsay, MD  Brief Narrative: 80 year old male with history of colon cancer, ESRD on hemodialysis, hep C/cirrhosis, hypertension presented to the ED with chest pain in the setting of missed hemodialysis, he reported periodic chest pain for couple of days, was seen in the emergency room on Monday, felt to be atypical, discharged home was unable to attend his normal dialysis treatment, presented back to the ED on 8/16 late night with worsening shortness of breath, started on BiPAP and nitro drip, was subsequently taken for urgent hemodialysis, while in dialysis had an episode of unresponsiveness while on BiPAP, quickly improved, following this he had a second episode of unresponsiveness during which he bradycardia down to the 30s, was started on CPR, he then vomited blood, he had about 1 minute of CPR before ROSC was achieved, subsequently transferred to stepdown unit  Assessment & Plan:   Volume overload -Secondary to missed hemodialysis -Improved after HD -Nephrology following -Clinically do not suspect pneumonia, discontinued Unasyn   Syncope -Suspected vasovagal episodes -Bradycardia down to 30s the second episode, CODE BLUE was called, had 1 minute of CPR, ROSC was achieved -Cardiology following -Echo with mild cardiomyopathy and global left ventricular hypokinesis -Follow-up with cardiology  Chest pain Mildly elevated high-sensitivity troponin -echo with EF of 45-50% with global left ventricular hypokinesis -Cardiology recommended outpatient follow-up  ESRD on hemodialysis -Nephrology consulting, dialyzed yesterday  Acute blood loss anemia, anemia of chronic disease Upper GI bleed, GEJ ulcers -Gastroenterology following, underwent endoscopy which noted ulcerated lesions at [place], treated with hemoclips X3 -Continue Protonix twice daily -Per GI okay to advance to liquid  diet -Continue to trend hemoglobin -Continue EPO with HD, IV iron and oral iron  Hep C, cirrhosis -Reportedly cured  DVT prophylaxis: SCDs Code Status: Full code Family Communication: No family at bedside, updated daughter-in-law from patient's speaker phone Disposition Plan:  Status is: Inpatient  Remains inpatient appropriate because:Inpatient level of care appropriate due to severity of illness  Dispo: The patient is from: Home              Anticipated d/c is to: Home              Patient currently is not medically stable to d/c.   Difficult to place patient No   Consultants:  -Cardiology Nephrology GI  Procedures:   Antimicrobials:    Subjective: -Feels okay today, denies any complaints  Objective: Vitals:   11/23/20 0300 11/23/20 0400 11/23/20 0520 11/23/20 0733  BP:  (!) 138/53 134/67 (!) 135/52  Pulse:   77 68  Resp: 17 (!) 23 16 18   Temp:   98.1 F (36.7 C) 98 F (36.7 C)  TempSrc:   Oral Oral  SpO2:   100% 98%  Weight:   77 kg   Height:        Intake/Output Summary (Last 24 hours) at 11/23/2020 1108 Last data filed at 11/23/2020 0500 Gross per 24 hour  Intake 300 ml  Output 200 ml  Net 100 ml   Filed Weights   11/20/20 2206 11/22/20 0937 11/23/20 0520  Weight: 76.5 kg 76.5 kg 77 kg    Examination:  General exam: Pleasant elderly male sitting up in bed, AAOx3, no distress HEENT: No JVD CVS: S1-S2, regular rate rhythm Lungs: Decreased breath sounds the bases Abdomen: Soft, nontender, bowel sounds present  extremities: No edema  Psych: Appropriate mood and affect  Data Reviewed:  CBC: Recent Labs  Lab 11/18/20 1146 11/19/20 2341 11/20/20 0006 11/20/20 1018 11/20/20 1455 11/21/20 0441 11/22/20 0044 11/23/20 0107  WBC 9.7 12.6*  --  12.3* 11.6* 11.3* 9.5 11.9*  NEUTROABS 7.1 10.5*  --   --   --   --   --   --   HGB 9.5* 8.4*   < > 8.2* 7.4* 7.8* 7.3* 6.7*  HCT 28.6* 25.3*   < > 24.3* 22.3* 22.2* 21.7* 20.3*  MCV 92.6 93.7  --   93.1 92.9 90.6 91.9 91.9  PLT 170 180  --  164 166 143* 136* 119*   < > = values in this interval not displayed.   Basic Metabolic Panel: Recent Labs  Lab 11/19/20 2341 11/20/20 0006 11/20/20 0827 11/21/20 0441 11/22/20 0044 11/23/20 0107  NA 137 140 138 138 137 133*  K 4.1 4.0 4.4 4.1 4.8 4.1  CL 105 107 106 99 102 97*  CO2 21*  --  23 27 26 24   GLUCOSE 179* 166* 113* 82 92 86  BUN 100* 93* 78* 57* 81* 46*  CREATININE 11.39* 11.40* 8.24* 7.72* 9.21* 5.83*  CALCIUM 9.1  --  9.2 9.0 8.5* 8.4*  PHOS  --   --  3.5  --   --   --    GFR: Estimated Creatinine Clearance: 9.5 mL/min (A) (by C-G formula based on SCr of 5.83 mg/dL (H)). Liver Function Tests: Recent Labs  Lab 11/18/20 1146 11/19/20 2341 11/20/20 0827  AST 18 22  --   ALT 31 26  --   ALKPHOS 116 110  --   BILITOT 0.9 0.8  --   PROT 7.2 6.9  --   ALBUMIN 3.0* 3.0* 2.7*   No results for input(s): LIPASE, AMYLASE in the last 168 hours. No results for input(s): AMMONIA in the last 168 hours. Coagulation Profile: No results for input(s): INR, PROTIME in the last 168 hours. Cardiac Enzymes: No results for input(s): CKTOTAL, CKMB, CKMBINDEX, TROPONINI in the last 168 hours. BNP (last 3 results) No results for input(s): PROBNP in the last 8760 hours. HbA1C: No results for input(s): HGBA1C in the last 72 hours. CBG: Recent Labs  Lab 11/20/20 0947  GLUCAP 129*   Lipid Profile: No results for input(s): CHOL, HDL, LDLCALC, TRIG, CHOLHDL, LDLDIRECT in the last 72 hours. Thyroid Function Tests: No results for input(s): TSH, T4TOTAL, FREET4, T3FREE, THYROIDAB in the last 72 hours. Anemia Panel: Recent Labs    11/23/20 0732  FERRITIN 167  TIBC 220*  IRON 28*   Urine analysis: No results found for: COLORURINE, APPEARANCEUR, LABSPEC, PHURINE, GLUCOSEU, HGBUR, BILIRUBINUR, KETONESUR, PROTEINUR, UROBILINOGEN, NITRITE, LEUKOCYTESUR Sepsis Labs: @LABRCNTIP (procalcitonin:4,lacticidven:4)  ) Recent Results (from  the past 240 hour(s))  Resp Panel by RT-PCR (Flu A&B, Covid) Nasopharyngeal Swab     Status: None   Collection Time: 11/20/20 12:02 AM   Specimen: Nasopharyngeal Swab; Nasopharyngeal(NP) swabs in vial transport medium  Result Value Ref Range Status   SARS Coronavirus 2 by RT PCR NEGATIVE NEGATIVE Final    Comment: (NOTE) SARS-CoV-2 target nucleic acids are NOT DETECTED.  The SARS-CoV-2 RNA is generally detectable in upper respiratory specimens during the acute phase of infection. The lowest concentration of SARS-CoV-2 viral copies this assay can detect is 138 copies/mL. A negative result does not preclude SARS-Cov-2 infection and should not be used as the sole basis for treatment or other patient management decisions. A negative result may occur with  improper specimen collection/handling, submission of specimen  other than nasopharyngeal swab, presence of viral mutation(s) within the areas targeted by this assay, and inadequate number of viral copies(<138 copies/mL). A negative result must be combined with clinical observations, patient history, and epidemiological information. The expected result is Negative.  Fact Sheet for Patients:  EntrepreneurPulse.com.au  Fact Sheet for Healthcare Providers:  IncredibleEmployment.be  This test is no t yet approved or cleared by the [place] FDA and  has been authorized for detection and/or diagnosis of SARS-CoV-2 by FDA under an Emergency Use Authorization (EUA). This EUA will remain  in effect (meaning this test can be used) for the duration of the COVID-19 declaration under Section 564(b)(1) of the Act, 21 U.S.C.section 360bbb-3(b)(1), unless the authorization is terminated  or revoked sooner.       Influenza A by PCR NEGATIVE NEGATIVE Final   Influenza B by PCR NEGATIVE NEGATIVE Final    Comment: (NOTE) The Xpert Xpress SARS-CoV-2/FLU/RSV plus assay is intended as an aid in the diagnosis of  influenza from Nasopharyngeal swab specimens and should not be used as a sole basis for treatment. Nasal washings and aspirates are unacceptable for Xpert Xpress SARS-CoV-2/FLU/RSV testing.  Fact Sheet for Patients: EntrepreneurPulse.com.au  Fact Sheet for Healthcare Providers: IncredibleEmployment.be  This test is not yet approved or cleared by the [place] FDA and has been authorized for detection and/or diagnosis of SARS-CoV-2 by FDA under an Emergency Use Authorization (EUA). This EUA will remain in effect (meaning this test can be used) for the duration of the COVID-19 declaration under Section 564(b)(1) of the Act, 21 U.S.C. section 360bbb-3(b)(1), unless the authorization is terminated or revoked.  Performed at [facility], [place] [address], [place], [place] [address]   MRSA Next Gen by PCR, Nasal     Status: None   Collection Time: 11/20/20 10:18 AM   Specimen: Nasal Mucosa; Nasal Swab  Result Value Ref Range Status   MRSA by PCR Next Gen NOT DETECTED NOT DETECTED Final    Comment: (NOTE) The GeneXpert MRSA Assay (FDA approved for NASAL specimens only), is one component of a comprehensive MRSA colonization surveillance program. It is not intended to diagnose MRSA infection nor to guide or monitor treatment for MRSA infections. Test performance is not FDA approved in patients less than 37 years old. Performed at [facility], [place] [address], [place], [place] [address]      Scheduled Meds:  brinzolamide  1 drop Left Eye BID   And   brimonidine  1 drop Left Eye BID   Chlorhexidine Gluconate Cloth  6 each Topical Q0600   darbepoetin (ARANESP) injection - DIALYSIS  100 mcg Intravenous Q Fri-HD   ferric citrate  420 mg Oral BID WC   ketorolac  1 drop Left Eye QID   mouth rinse  15 mL Mouth Rinse BID   multivitamin  1 tablet Oral Daily   ofloxacin  1 drop Left Eye QID   pantoprazole (PROTONIX) IV  40 mg Intravenous  Q12H   polyethylene glycol  17 g Oral Daily   prednisoLONE acetate  1 drop Left Eye QID   sodium chloride flush  3 mL Intravenous Q12H   sucralfate  1 g Oral TID WC & HS   torsemide  10 mg Oral Daily   Continuous Infusions:  ferumoxytol       LOS: 3 days    Time spent: 33min    Domenic Polite, MD [facility]   11/23/2020, 11:08 AM

## 2020-11-24 LAB — CBC
HCT: 19.9 % — ABNORMAL LOW (ref 39.0–52.0)
Hemoglobin: 6.9 g/dL — CL (ref 13.0–17.0)
MCH: 31.8 pg (ref 26.0–34.0)
MCHC: 34.7 g/dL (ref 30.0–36.0)
MCV: 91.7 fL (ref 80.0–100.0)
Platelets: 134 10*3/uL — ABNORMAL LOW (ref 150–400)
RBC: 2.17 MIL/uL — ABNORMAL LOW (ref 4.22–5.81)
RDW: 15.2 % (ref 11.5–15.5)
WBC: 11.1 10*3/uL — ABNORMAL HIGH (ref 4.0–10.5)
nRBC: 0 % (ref 0.0–0.2)

## 2020-11-24 MED ORDER — SODIUM CHLORIDE 0.9 % IV SOLN
510.0000 mg | Freq: Once | INTRAVENOUS | Status: DC
  Filled 2020-11-24: qty 17

## 2020-11-24 MED ORDER — PANTOPRAZOLE SODIUM 40 MG PO TBEC
40.0000 mg | DELAYED_RELEASE_TABLET | Freq: Two times a day (BID) | ORAL | Status: DC
  Administered 2020-11-24 – 2020-12-03 (×17): 40 mg via ORAL
  Filled 2020-11-24 (×17): qty 1

## 2020-11-24 NOTE — Progress Notes (Addendum)
[name] Colon Progress Note   Assessment/ Plan:    Dialyzes at [facility] MWF 4 hrs EDW 77.5 kg F180  3K. 2.0 Ca bath BFR 400/ DFR 1.5 via CVC UF profile none Heparin bolus 2400 u  Mircera 50 mcg q 4 weeks, last given 11/13/20 Calcitriol 0.75 mcg q rx Binder- Auryxia 210 mg 1 TID AC  1 SOB/ respiratory distress: missed HD on Monday- likely volume overload.  Possbily aspirated during vomiting episode on BiPaP so on Unasyn too.  Improved/ resolved after HD 2.  Suspected UGIB: no documented varices 03/2020 in [facility]'s system.  GI consulting--> s/p EGD 8/19 with some bleeding ulcers, clipped.  Pt is a Jehovah's witness--> does NOT want blood products.  Would use caution with carafate in ESRD (can cause aluminum toxicity). 3.  Chest pain/ vasovagal syncope- cardiology c/s, appreciate assistance.  Interestingly, pt was prescribed Simbriza eye gtts from a cataract surgery last week--> Has A2 agonist properties so ? 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  HD on schedule, got it 11/22/20, next planned for Monday.  No acute indications for HD today- and I am hesitant to dialyze him in the setting of a < 7.0 Hgb with significant orthostasis.   5 Hypertension: not on any BP meds as OP per chart- pt's dtr says he's on amlodipine 10 mg daily.  On torsemide 20 mg on non-dialysis days. 6 Anemia of ESRD:  Hgb dropped in setting of GI bleed, got Aranesp 100 mcg 11/22/20 with HD, s/p feraheme 510 mg IV x 1, will order another 510 mg IV x 1. in setting of Tsat 13% 7. Metabolic Bone Disease: calcitriol with rx, Auryxia as binder 8.  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  H/o prostate cancer 10: h/o colon cancer 11: Dispo: pending.  Updated dtr and son-in-law over the phone  Subjective:    Hgb 6.9 this AM, s/p feraheme yesterday. Significantly orthostatic yesterday.     Objective:   BP (!) 149/62 (BP Location: Left Arm)   Pulse  72   Temp 98.3 F (36.8 C) (Oral)   Resp 15   Ht 5\' 8"  (1.727 m)   Wt 77 kg   SpO2 98%   BMI 25.81 kg/m   Physical Exam: GEN lying in bed, says his stomach hurts HEENT EOMI PERRL NECK no JVD PULM clear  CV RRR ABD soft, mildly distended, improved EXT no LE edema NEURO AAO x 3 nonfocal ACCESS: R IJ [facility]  Labs: BMET Recent Labs  Lab 11/18/20 1146 11/19/20 2341 11/20/20 0006 11/20/20 0827 11/21/20 0441 11/22/20 0044 11/23/20 0107  NA 139 137 140 138 138 137 133*  K 4.4 4.1 4.0 4.4 4.1 4.8 4.1  CL 104 105 107 106 99 102 97*  CO2 23 21*  --  23 27 26 24   GLUCOSE 97 179* 166* 113* 82 92 86  BUN 79* 100* 93* 78* 57* 81* 46*  CREATININE 9.78* 11.39* 11.40* 8.24* 7.72* 9.21* 5.83*  CALCIUM 9.2 9.1  --  9.2 9.0 8.5* 8.4*  PHOS  --   --   --  3.5  --   --   --    CBC Recent Labs  Lab 11/18/20 1146 11/19/20 2341 11/20/20 0006 11/21/20 0441 11/22/20 0044 11/23/20 0107 11/24/20 0052  WBC 9.7 12.6*   < > 11.3* 9.5 11.9* 11.1*  NEUTROABS 7.1 10.5*  --   --   --   --   --  HGB 9.5* 8.4*   < > 7.8* 7.3* 6.7* 6.9*  HCT 28.6* 25.3*   < > 22.2* 21.7* 20.3* 19.9*  MCV 92.6 93.7   < > 90.6 91.9 91.9 91.7  PLT 170 180   < > 143* 136* 119* 134*   < > = values in this interval not displayed.      Medications:     brinzolamide  1 drop Left Eye BID   And   brimonidine  1 drop Left Eye BID   Chlorhexidine Gluconate Cloth  6 each Topical Q0600   darbepoetin (ARANESP) injection - DIALYSIS  100 mcg Intravenous Q Fri-HD   ferric citrate  420 mg Oral BID WC   ketorolac  1 drop Left Eye QID   mouth rinse  15 mL Mouth Rinse BID   multivitamin  1 tablet Oral Daily   ofloxacin  1 drop Left Eye QID   pantoprazole (PROTONIX) IV  40 mg Intravenous Q12H   polyethylene glycol  17 g Oral Daily   prednisoLONE acetate  1 drop Left Eye QID   sodium chloride flush  3 mL Intravenous Q12H   sucralfate  1 g Oral TID WC & HS   torsemide  10 mg Oral Daily     Madelon Lips  MD 11/24/2020, 10:01 AM

## 2020-11-24 NOTE — Plan of Care (Signed)
  Problem: Health Behavior/Discharge Planning: Goal: Ability to manage health-related needs will improve Outcome: Progressing   Problem: Clinical Measurements: Goal: Ability to maintain clinical measurements within normal limits will improve Outcome: Progressing Goal: Will remain free from infection Outcome: Progressing Goal: Diagnostic test results will improve Outcome: Progressing Goal: Respiratory complications will improve Outcome: Progressing Goal: Cardiovascular complication will be avoided Outcome: Progressing   Problem: Activity: Goal: Risk for activity intolerance will decrease Outcome: Progressing   Problem: Elimination: Goal: Will not experience complications related to bowel motility Outcome: Progressing   Problem: Pain Managment: Goal: General experience of comfort will improve Outcome: Progressing   Problem: Safety: Goal: Ability to remain free from injury will improve Outcome: Progressing   Problem: Skin Integrity: Goal: Risk for impaired skin integrity will decrease Outcome: Progressing

## 2020-11-24 NOTE — Progress Notes (Addendum)
Daily Rounding Note  11/24/2020, 12:17 PM  LOS: 4 days   SUBJECTIVE:   Chief complaint: Esophageal ulcers, suspect Mallory-Weiss tear.  Hematemesis.  GI bleed.  Patient is hungry.  Had only had water up until now.  Last bowel movement was a couple of days ago.  It is always black because he takes iron supplements.  No abdominal pain.  No nausea.  Blood pressure dropped yesterday when they got him up to standing position and walked.  OBJECTIVE:         Vital signs in last 24 hours:    Temp:  [97.7 F (36.5 C)-98.3 F (36.8 C)] 97.7 F (36.5 C) (08/21 1111) Pulse Rate:  [72-76] 74 (08/21 1111) Resp:  [14-22] 14 (08/21 1111) BP: (145-161)/(57-80) 146/62 (08/21 1111) SpO2:  [98 %-100 %] 100 % (08/21 1111) Last BM Date: 11/20/20 Filed Weights   11/20/20 2206 11/22/20 0937 11/23/20 0520  Weight: 76.5 kg 76.5 kg 77 kg   General: Looks well.  Nonverbal, alert. Heart: RRR. Chest: Clear bilaterally.  No labored breathing.  No cough. Abdomen: Not tender or distended.  Bowel sounds active. Extremities: No CCE. Neuro/Psych: Pleasant, calm.  Fully oriented.  Appropriate.  Fluid speech.  No tremors or gross deficits/weakness.  Intake/Output from previous day: 08/20 0701 - 08/21 0700 In: 267.1 [P.O.:150; IV Piggyback:117.1] Out: 300 [Urine:300]  Intake/Output this shift: No intake/output data recorded.  Lab Results: Recent Labs    11/22/20 0044 11/23/20 0107 11/24/20 0052  WBC 9.5 11.9* 11.1*  HGB 7.3* 6.7* 6.9*  HCT 21.7* 20.3* 19.9*  PLT 136* 119* 134*   BMET Recent Labs    11/22/20 0044 11/23/20 0107  NA 137 133*  K 4.8 4.1  CL 102 97*  CO2 26 24  GLUCOSE 92 86  BUN 81* 46*  CREATININE 9.21* 5.83*  CALCIUM 8.5* 8.4*   LFT No results for input(s): PROT, ALBUMIN, AST, ALT, ALKPHOS, BILITOT, BILIDIR, IBILI in the last 72 hours. PT/INR No results for input(s): LABPROT, INR in the last 72 hours. Hepatitis  Panel No results for input(s): HEPBSAG, HCVAB, HEPAIGM, HEPBIGM in the last 72 hours.  Studies/Results: No results found.  Scheduled Meds:  brinzolamide  1 drop Left Eye BID   And   brimonidine  1 drop Left Eye BID   Chlorhexidine Gluconate Cloth  6 each Topical Q0600   darbepoetin (ARANESP) injection - DIALYSIS  100 mcg Intravenous Q Fri-HD   ferric citrate  420 mg Oral BID WC   ketorolac  1 drop Left Eye QID   mouth rinse  15 mL Mouth Rinse BID   multivitamin  1 tablet Oral Daily   ofloxacin  1 drop Left Eye QID   pantoprazole (PROTONIX) IV  40 mg Intravenous Q12H   polyethylene glycol  17 g Oral Daily   prednisoLONE acetate  1 drop Left Eye QID   sodium chloride flush  3 mL Intravenous Q12H   sucralfate  1 g Oral TID WC & HS   torsemide  10 mg Oral Daily   Continuous Infusions:  [START ON 11/26/2020] ferumoxytol     PRN Meds:.acetaminophen **OR** acetaminophen, calcium carbonate (dosed in mg elemental calcium), camphor-menthol **AND** hydrOXYzine, docusate sodium, feeding supplement (NEPRO CARB STEADY), hydrALAZINE, lidocaine (PF), lidocaine-prilocaine, ondansetron **OR** ondansetron (ZOFRAN) IV, pentafluoroprop-tetrafluoroeth, sorbitol, zolpidem   ASSESMENT:   Hematemesis x1.   11/22/2020 EGD with active bleeding at [facility].  Treated with endoclips.  Suspect Mallory-Weiss  tear.  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  3 cm hiatal hernia.  Clotted blood in gastric fundus and body.  Duodenum not examined to avoid disruption of the endoclips but low suspicion for duodenal pathology.  Blood loss anemia in background of chronic anemia. Hgb 6.7 >> 6.9.  no PRBCs to date as pt is Jeh Witness.  Low iron, low iron sats, low TIBC.  Ferritin 167.  On ferric citrate, weekly Aranesp infusion.       Thrombocytopenia.  Platelets now 134.    ESRD.       Hyponatremia.  Na 133.    Cirrhosis from hep C.  S/p  hep C eradication.  Colon cancer, left colectomy and 5-FU chemo in 2006.   GI, liver MD at [facility]: Dr Rosealee Albee.     PLAN   Continue Protonix 40 mg bid (or equivalent PPI) x 8 weeks, switched to po starting tonite.  Carafate slurry qid for at least a week.  Advanced to renal diet.    [name] Colon  11/24/2020, 12:17 PM Phone [phone]    Attending Physician's Attestation   I have taken an interval history, reviewed the chart and examined the patient.   Hgb stable overnight.  No symptoms of recurrent GI bleed.  Agree with advancing diet today.  Continue BID PPI x 8 weeks, carafate for a week. Patient can follow up with primary gastroenterologist at [facility].    I agree with the Advanced Practitioner's note, impression, and recommendations with updates and my documentation above.   Dustin Flock, MD [facility]

## 2020-11-25 ENCOUNTER — Encounter (HOSPITAL_COMMUNITY): Payer: Self-pay | Admitting: Gastroenterology

## 2020-11-25 DIAGNOSIS — K920 Hematemesis: Secondary | ICD-10-CM | POA: Diagnosis not present

## 2020-11-25 DIAGNOSIS — I10 Essential (primary) hypertension: Secondary | ICD-10-CM | POA: Diagnosis not present

## 2020-11-25 DIAGNOSIS — E8770 Fluid overload, unspecified: Secondary | ICD-10-CM

## 2020-11-25 DIAGNOSIS — I5043 Acute on chronic combined systolic (congestive) and diastolic (congestive) heart failure: Secondary | ICD-10-CM | POA: Diagnosis not present

## 2020-11-25 DIAGNOSIS — K226 Gastro-esophageal laceration-hemorrhage syndrome: Secondary | ICD-10-CM

## 2020-11-25 LAB — BASIC METABOLIC PANEL
Anion gap: 12 (ref 5–15)
Anion gap: 12 (ref 5–15)
BUN: 46 mg/dL — ABNORMAL HIGH (ref 8–23)
BUN: 84 mg/dL — ABNORMAL HIGH (ref 8–23)
CO2: 24 mmol/L (ref 22–32)
CO2: 24 mmol/L (ref 22–32)
Calcium: 8.2 mg/dL — ABNORMAL LOW (ref 8.9–10.3)
Calcium: 8.4 mg/dL — ABNORMAL LOW (ref 8.9–10.3)
Chloride: 97 mmol/L — ABNORMAL LOW (ref 98–111)
Chloride: 97 mmol/L — ABNORMAL LOW (ref 98–111)
Creatinine, Ser: 5.83 mg/dL — ABNORMAL HIGH (ref 0.61–1.24)
Creatinine, Ser: 9.61 mg/dL — ABNORMAL HIGH (ref 0.61–1.24)
GFR, Estimated: 5 mL/min — ABNORMAL LOW (ref >60)
GFR, Estimated: 9 mL/min — ABNORMAL LOW (ref >60)
Glucose, Bld: 84 mg/dL (ref 70–99)
Glucose, Bld: 86 mg/dL (ref 70–99)
Potassium: 3.8 mmol/L (ref 3.5–5.1)
Potassium: 4.1 mmol/L (ref 3.5–5.1)
Sodium: 133 mmol/L — ABNORMAL LOW (ref 135–145)
Sodium: 133 mmol/L — ABNORMAL LOW (ref 135–145)

## 2020-11-25 LAB — CBC
HCT: 18.8 % — ABNORMAL LOW (ref 39.0–52.0)
HCT: 20.9 % — ABNORMAL LOW (ref 39.0–52.0)
Hemoglobin: 6.4 g/dL — CL (ref 13.0–17.0)
Hemoglobin: 7 g/dL — ABNORMAL LOW (ref 13.0–17.0)
MCH: 31.2 pg (ref 26.0–34.0)
MCH: 31.3 pg (ref 26.0–34.0)
MCHC: 34 g/dL (ref 30.0–36.0)
MCHC: 33.5 g/dL (ref 30.0–36.0)
MCV: 91.7 fL (ref 80.0–100.0)
MCV: 93.3 fL (ref 80.0–100.0)
Platelets: 129 10*3/uL — ABNORMAL LOW (ref 150–400)
Platelets: 134 10*3/uL — ABNORMAL LOW (ref 150–400)
RBC: 2.05 MIL/uL — ABNORMAL LOW (ref 4.22–5.81)
RBC: 2.24 MIL/uL — ABNORMAL LOW (ref 4.22–5.81)
RDW: 15 % (ref 11.5–15.5)
RDW: 15.4 % (ref 11.5–15.5)
WBC: 10.9 10*3/uL — ABNORMAL HIGH (ref 4.0–10.5)
WBC: 10.8 10*3/uL — ABNORMAL HIGH (ref 4.0–10.5)
nRBC: 0.6 % — ABNORMAL HIGH (ref 0.0–0.2)
nRBC: 0.5 % — ABNORMAL HIGH (ref 0.0–0.2)

## 2020-11-25 NOTE — Progress Notes (Signed)
Physical Therapy Treatment Patient Details Name: [name] Colon MRN: [id number] DOB: 1940-10-23 Today's Date: 11/25/2020    History of Present Illness Pt is an 80 year old man who came to the ED on 11/18/20 with chest pain, but tests were negative. He returned on 11/19/20 with chest pain, SOB requring bipap and nitroglycerin after missed HD session. Hospital course complicated by 2 episodes of unresponsiveness, CPR and vomiting blood associated during HD. PMH: colon cancer, ESRD, hep C, cirrhosis, HTN.    PT Comments    Patient received in recliner. He looks well. Feels okay. BP at rest in recliner 140/57. Patient performed seated exercises, then sit to stand with supervision. Standing BP 114/56/ patient with no reports of dizziness. He was able to ambulate in room about 40 feet with min guard. BP once returned to recliner 135/58. Patient will continue to benefit from skilled PT while here to improve functional independence and safety.     Follow Up Recommendations  Home health PT;Supervision for mobility/OOB     Equipment Recommendations  None recommended by PT    Recommendations for Other Services       Precautions / Restrictions Precautions Precautions: Fall Precaution Comments: watch orthostatics, mod fall Restrictions Weight Bearing Restrictions: No    Mobility  Bed Mobility Overal bed mobility: Needs Assistance Bed Mobility: Supine to Sit     Supine to sit: Supervision     General bed mobility comments: patient received in recliner, and remained in recliner at end of session.    Transfers Overall transfer level: Needs assistance Equipment used: None Transfers: Sit to/from Stand Sit to Stand: Supervision Stand pivot transfers: Min guard       General transfer comment: no lob or unsteadiness with standing. Reports no dizziness with standing this session.  Ambulation/Gait Ambulation/Gait assistance: Min guard Gait Distance (Feet): 40 Feet Assistive device:  None Gait Pattern/deviations: Step-through pattern;Decreased step length - right;Decreased step length - left;Decreased stride length Gait velocity: decr   General Gait Details: ambulated in room with min guard.  No dizziness reported until seated back down, reports some "wooziness"   Stairs             Wheelchair Mobility    Modified Rankin (Stroke Patients Only)       Balance Overall balance assessment: Mild deficits observed, not formally tested Sitting-balance support: Feet supported Sitting balance-Leahy Scale: Normal     Standing balance support: Single extremity supported;During functional activity Standing balance-Leahy Scale: Good Standing balance comment: min guard for ambulation for safety                            Cognition Arousal/Alertness: Awake/alert Behavior During Therapy: WFL for tasks assessed/performed Overall Cognitive Status: Within Functional Limits for tasks assessed                                 General Comments: pt pleasant and making appropriate conversation      Exercises Other Exercises Other Exercises: B LE seated exercises: LAQ, marching x 15 reps each    General Comments General comments (skin integrity, edema, etc.): Orthostatic vitals monitored throughout. Supine BP 127/53; EOB 131/59; standing 112/50. HR 80-90's.      Pertinent Vitals/Pain Pain Assessment: No/denies pain    Home Living  Prior Function            PT Goals (current goals can now be found in the care plan section) Acute Rehab PT Goals Patient Stated Goal: return home PT Goal Formulation: With patient/family Time For Goal Achievement: 12/07/20 Potential to Achieve Goals: Good Progress towards PT goals: Progressing toward goals    Frequency    Min 3X/week      PT Plan Current plan remains appropriate    Co-evaluation              AM-PAC PT "6 Clicks" Mobility   Outcome  Measure  Help needed turning from your back to your side while in a flat bed without using bedrails?: None Help needed moving from lying on your back to sitting on the side of a flat bed without using bedrails?: None Help needed moving to and from a bed to a chair (including a wheelchair)?: None Help needed standing up from a chair using your arms (e.g., wheelchair or bedside chair)?: None Help needed to walk in hospital room?: A Little Help needed climbing 3-5 steps with a railing? : A Little 6 Click Score: 22    End of Session   Activity Tolerance: Patient tolerated treatment well;Patient limited by fatigue Patient left: in chair;with call bell/phone within reach;with family/visitor present Nurse Communication: Mobility status PT Visit Diagnosis: Other abnormalities of gait and mobility (R26.89)     Time: 7124-5809 PT Time Calculation (min) (ACUTE ONLY): 19 min  Charges:  $Therapeutic Exercise: 8-22 mins                    [employer], PT, [employer] 11/25/20,11:24 AM

## 2020-11-25 NOTE — Progress Notes (Signed)
Occupational Therapy Treatment Patient Details Name: [name] Colon MRN: [id number] DOB: January 30, 1941 Today's Date: 11/25/2020    History of present illness Pt is an 80 year old man who came to the ED on 11/18/20 with chest pain, but tests were negative. He returned on 11/19/20 with chest pain, SOB requring bipap and nitroglycerin after missed HD session. Hospital course complicated by 2 episodes of unresponsiveness, CPR and vomiting blood associated during HD. PMH: colon cancer, ESRD, hep C, cirrhosis, HTN.   OT comments  Patient met lying supine in bed. OT treatment session with focus on self-care re-education and energy conservation. Session limited by orthostatic hypotension with 19 point drop in SBP with sit to stand. Patient reporting symptoms. Oral hygiene and face washing completed in recliner with set-up assist. OT will continue to follow acutely.    Follow Up Recommendations  No OT follow up    Equipment Recommendations  None recommended by OT    Recommendations for Other Services      Precautions / Restrictions Precautions Precautions: Fall Precaution Comments: watch orthostatics Restrictions Weight Bearing Restrictions: No       Mobility Bed Mobility Overal bed mobility: Needs Assistance Bed Mobility: Supine to Sit     Supine to sit: Supervision     General bed mobility comments: supervision for lines, cues for pacing.    Transfers Overall transfer level: Needs assistance Equipment used: 1 person hand held assist Transfers: Sit to/from [facility] Sit to Stand: Min guard Stand pivot transfers: Min guard       General transfer comment: Min guard for stafety/steadying    Balance Overall balance assessment: Needs assistance Sitting-balance support: No upper extremity supported Sitting balance-Leahy Scale: Good     Standing balance support: Single extremity supported Standing balance-Leahy Scale: Fair Standing balance comment: min guard  assist in static standing, min assist to transfer                           ADL either performed or assessed with clinical judgement   ADL Overall ADL's : Needs assistance/impaired     Grooming: Set up;Sitting Grooming Details (indicate cue type and reason): Unable to complete standing at sink level secondary to symptomatic orthostatic hypotension             Lower Body Dressing: Min guard;Sit to/from stand Lower Body Dressing Details (indicate cue type and reason): Able to adjust footwear seated EOB without external assist. Toilet Transfer: Min guard Toilet Transfer Details (indicate cue type and reason): HHA and stand-pivot to recliner.                 Vision       Perception     Praxis      Cognition Arousal/Alertness: Awake/alert Behavior During Therapy: WFL for tasks assessed/performed Overall Cognitive Status: Within Functional Limits for tasks assessed                                          Exercises     Shoulder Instructions       General Comments Orthostatic vitals monitored throughout. Supine BP 127/53; EOB 131/59; standing 112/50. HR 80-90's.    Pertinent Vitals/ Pain       Pain Assessment: No/denies pain  Home Living  Prior Functioning/Environment              Frequency  Min 2X/week        Progress Toward Goals  OT Goals(current goals can now be found in the care plan section)  Progress towards OT goals: Progressing toward goals  Acute Rehab OT Goals Patient Stated Goal: return home OT Goal Formulation: With patient Time For Goal Achievement: 12/07/20 Potential to Achieve Goals: Good ADL Goals Pt Will Perform Grooming: with supervision;standing Pt Will Perform Lower Body Bathing: with supervision;sit to/from stand Pt Will Perform Lower Body Dressing: with supervision;sit to/from stand Pt Will Transfer to Toilet: with  supervision;ambulating Pt Will Perform Toileting - Clothing Manipulation and hygiene: with supervision;sit to/from stand  Plan Discharge plan remains appropriate;Frequency remains appropriate    Co-evaluation                 AM-PAC OT "6 Clicks" Daily Activity     Outcome Measure   Help from another person eating meals?: None Help from another person taking care of personal grooming?: A Little Help from another person toileting, which includes using toliet, bedpan, or urinal?: A Little Help from another person bathing (including washing, rinsing, drying)?: A Little Help from another person to put on and taking off regular upper body clothing?: None Help from another person to put on and taking off regular lower body clothing?: A Little 6 Click Score: 20    End of Session Equipment Utilized During Treatment: Gait belt  OT Visit Diagnosis: Unsteadiness on feet (R26.81);Other abnormalities of gait and mobility (R26.89)   Activity Tolerance Treatment limited secondary to medical complications (Comment);Other (comment) (Symptomatic orthostatic hypotension limiting further mobility)   Patient Left in chair;with call bell/phone within reach;with chair alarm set   Nurse Communication Mobility status;Other (comment) (Response to treatment)        Time: 4709-6283 OT Time Calculation (min): 18 min  Charges: OT General Charges $OT Visit: 1 Visit OT Treatments $Self Care/Home Management : 8-22 mins  Deklyn Gibbon H. OTR/L Supplemental OT, Department of rehab services [phone]   Caterine Mcmeans R H. 11/25/2020, 8:59 AM

## 2020-11-25 NOTE — Progress Notes (Signed)
[facility] Progress Note   Assessment/ Plan:    OP HD: [facility] MWF   4h  77.5kg  3/2.0 bath  400/1.5  TDC  Hep 2400 Mircera 50 mcg q 4 weeks, last given 11/13/20 Calcitriol 0.75 mcg q rx Binder- Auryxia 210 mg 1 TID AC  Assessment/ Plan SOB/ respiratory distress: resolved w/ HD. Poss asp pneumonitis too Suspected UGIB: no documented varices 03/2020 in [facility]'s system.  GI consulting--> s/p EGD 8/19 with some bleeding ulcers, clipped.  Pt is a Jehovah's witness--> does NOT want blood products.  Would use caution with carafate in ESRD (can cause aluminum toxicity). 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  Got HD here 11/22/20, next planned for today HTN/ volume: not on any BP meds as OP per chart- pt's dtr says he's on amlodipine 10 mg daily.  On torsemide 20 mg on non-dialysis days. At dry wt, no vol^ on exam. 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 H/o prostate cancer H/o colon cancer  Kelly Splinter, MD 11/25/2020, 1:10 PM     Subjective:   No c/o today   Objective:   BP (!) 148/62 (BP Location: Right Arm)   Pulse 78   Temp (!) 97.4 F (36.3 C) (Axillary)   Resp 16   Ht 5\' 8"  (1.727 m)   Wt 77 kg   SpO2 97%   BMI 25.81 kg/m   Physical Exam: GEN lying in bed, says his stomach hurts HEENT EOMI PERRL NECK no JVD PULM clear  CV RRR ABD soft, mildly distended, improved EXT no LE edema NEURO AAO x 3 nonfocal ACCESS: R IJ TDC  Labs: BMET Recent Labs  Lab 11/19/20 2341 11/20/20 0006 11/20/20 0827 11/21/20 0441 11/22/20 0044 11/23/20 0107 11/25/20 0059  NA 137 140 138 138 137 133* 133*  K 4.1 4.0 4.4 4.1 4.8 4.1 3.8  CL 105 107  106 99 102 97* 97*  CO2 21*  --  23 27 26 24 24   GLUCOSE 179* 166* 113* 82 92 86 84  BUN 100* 93* 78* 57* 81* 46* 84*  CREATININE 11.39* 11.40* 8.24* 7.72* 9.21* 5.83* 9.61*  CALCIUM 9.1  --  9.2 9.0 8.5* 8.4* 8.2*  PHOS  --   --  3.5  --   --   --   --     CBC Recent Labs  Lab 11/19/20 2341 11/20/20 0006 11/22/20 0044 11/23/20 0107 11/24/20 0052 11/25/20 0059  WBC 12.6*   < > 9.5 11.9* 11.1* 10.9*  NEUTROABS 10.5*  --   --   --   --   --   HGB 8.4*   < > 7.3* 6.7* 6.9* 6.4*  HCT 25.3*   < > 21.7* 20.3* 19.9* 18.8*  MCV 93.7   < > 91.9 91.9 91.7 91.7  PLT 180   < > 136* 119* 134* 129*   < > = values in this interval not displayed.       Medications:     brinzolamide  1 drop Left Eye BID   And   brimonidine  1 drop Left Eye BID   Chlorhexidine Gluconate Cloth  6 each  Topical Q0600   darbepoetin (ARANESP) injection - DIALYSIS  100 mcg Intravenous Q Fri-HD   ferric citrate  420 mg Oral BID WC   ketorolac  1 drop Left Eye QID   mouth rinse  15 mL Mouth Rinse BID   multivitamin  1 tablet Oral Daily   ofloxacin  1 drop Left Eye QID   pantoprazole  40 mg Oral BID   polyethylene glycol  17 g Oral Daily   prednisoLONE acetate  1 drop Left Eye QID   sodium chloride flush  3 mL Intravenous Q12H   sucralfate  1 g Oral TID WC & HS   torsemide  10 mg Oral Daily

## 2020-11-25 NOTE — Progress Notes (Addendum)
PROGRESS NOTE    [name] Colon  OZH:[id number] DOB: May 28, 1940 DOA: 11/19/2020 PCP: Leonel Ramsay, MD  Brief Narrative: 80 year old male with history of colon cancer, ESRD on hemodialysis, hep C/cirrhosis, hypertension presented to the ED with chest pain in the setting of missed hemodialysis, he reported periodic chest pain for couple of days, was seen in the emergency room on Monday, felt to be atypical, discharged home was unable to attend his normal dialysis treatment, presented back to the ED on 8/16 late night with worsening shortness of breath, started on BiPAP and nitro drip, was subsequently taken for urgent hemodialysis, while in dialysis had an episode of unresponsiveness while on BiPAP, quickly improved, following this he had a second episode of unresponsiveness during which he bradycardia down to the 30s, was started on CPR, he then vomited blood, he had about 1 minute of CPR before ROSC was achieved, subsequently transferred to stepdown unit  Assessment & Plan:   Volume overload -Secondary to missed hemodialysis -Improved after HD -Nephrology following -Clinically do not suspect pneumonia, discontinued Unasyn  -HD today -ambulate, PT eval  Vasovagal syncope -Bradycardia down to 30s the second episode, CODE BLUE was called, had 1 minute of CPR, ROSC was achieved -Echo with mild cardiomyopathy and global left ventricular hypokinesis -Cardiology consulted, felt it was vasovagal, recommended outpatient follow-up for mild cardiomyopathy  Chest pain Mildly elevated high-sensitivity troponin -echo with EF of 45-50% with global left ventricular hypokinesis -Cardiology recommended outpatient follow-up  ESRD on hemodialysis -Nephrology consulting, dialysis today  Acute blood loss anemia, anemia of chronic disease Upper GI bleed, GEJ ulcers -Gastroenterology following, underwent endoscopy which noted ulcerated lesions at [place], treated with hemoclips X3 -Continue Protonix  twice daily -Diet advanced -Hemoglobin down to 6.4 today, could be worsened by hemodilution, repeat CBC after HD -Continue EPO with HD, repeat IV iron and continue oral iron  Hep C, cirrhosis -Reportedly cured  DVT prophylaxis: SCDs Code Status: Full code Family Communication: No family at bedside, will update daughter Disposition Plan:  Status is: Inpatient  Remains inpatient appropriate because:Inpatient level of care appropriate due to severity of illness  Dispo: The patient is from: Home              Anticipated d/c is to: Home              Patient currently is not medically stable to d/c.   Difficult to place patient No   Consultants:  -Cardiology Nephrology GI  Procedures:   Antimicrobials:    Subjective: -Mild dizziness earlier today, feels better now Objective: Vitals:   11/24/20 2341 11/25/20 0428 11/25/20 0809 11/25/20 1143  BP: (!) 136/55 132/60 (!) 114/49 (!) 148/62  Pulse: 73 73 78 78  Resp: 17 15 15 16   Temp: 98.4 F (36.9 C) 98.3 F (36.8 C) 98.4 F (36.9 C) (!) 97.4 F (36.3 C)  TempSrc: Oral Oral Oral Axillary  SpO2: 99% 100% 99% 97%  Weight:      Height:        Intake/Output Summary (Last 24 hours) at 11/25/2020 1149 Last data filed at 11/25/2020 0830 Gross per 24 hour  Intake 440 ml  Output 1 ml  Net 439 ml   Filed Weights   11/20/20 2206 11/22/20 0937 11/23/20 0520  Weight: 76.5 kg 76.5 kg 77 kg    Examination:  General exam: Pleasant elderly male sitting up in bed, AAOx3, no distress HEENT: No JVD CVS: S1-S2, regular rate rhythm Lungs: Decreased breath sounds at the  bases Abdomen: Soft, nontender, bowel sounds present Extremities: No edema  Psych: Appropriate mood and affect  Data Reviewed:   CBC: Recent Labs  Lab 11/19/20 2341 11/20/20 0006 11/21/20 0441 11/22/20 0044 11/23/20 0107 11/24/20 0052 11/25/20 0059  WBC 12.6*   < > 11.3* 9.5 11.9* 11.1* 10.9*  NEUTROABS 10.5*  --   --   --   --   --   --   HGB 8.4*    < > 7.8* 7.3* 6.7* 6.9* 6.4*  HCT 25.3*   < > 22.2* 21.7* 20.3* 19.9* 18.8*  MCV 93.7   < > 90.6 91.9 91.9 91.7 91.7  PLT 180   < > 143* 136* 119* 134* 129*   < > = values in this interval not displayed.   Basic Metabolic Panel: Recent Labs  Lab 11/20/20 0827 11/21/20 0441 11/22/20 0044 11/23/20 0107 11/25/20 0059  NA 138 138 137 133* 133*  K 4.4 4.1 4.8 4.1 3.8  CL 106 99 102 97* 97*  CO2 23 27 26 24 24   GLUCOSE 113* 82 92 86 84  BUN 78* 57* 81* 46* 84*  CREATININE 8.24* 7.72* 9.21* 5.83* 9.61*  CALCIUM 9.2 9.0 8.5* 8.4* 8.2*  PHOS 3.5  --   --   --   --    GFR: Estimated Creatinine Clearance: 5.7 mL/min (A) (by C-G formula based on SCr of 9.61 mg/dL (H)). Liver Function Tests: Recent Labs  Lab 11/19/20 2341 11/20/20 0827  AST 22  --   ALT 26  --   ALKPHOS 110  --   BILITOT 0.8  --   PROT 6.9  --   ALBUMIN 3.0* 2.7*   No results for input(s): LIPASE, AMYLASE in the last 168 hours. No results for input(s): AMMONIA in the last 168 hours. Coagulation Profile: No results for input(s): INR, PROTIME in the last 168 hours. Cardiac Enzymes: No results for input(s): CKTOTAL, CKMB, CKMBINDEX, TROPONINI in the last 168 hours. BNP (last 3 results) No results for input(s): PROBNP in the last 8760 hours. HbA1C: No results for input(s): HGBA1C in the last 72 hours. CBG: Recent Labs  Lab 11/20/20 0947  GLUCAP 129*   Lipid Profile: No results for input(s): CHOL, HDL, LDLCALC, TRIG, CHOLHDL, LDLDIRECT in the last 72 hours. Thyroid Function Tests: No results for input(s): TSH, T4TOTAL, FREET4, T3FREE, THYROIDAB in the last 72 hours. Anemia Panel: Recent Labs    11/23/20 0732  FERRITIN 167  TIBC 220*  IRON 28*   Urine analysis: No results found for: COLORURINE, APPEARANCEUR, LABSPEC, PHURINE, GLUCOSEU, HGBUR, BILIRUBINUR, KETONESUR, PROTEINUR, UROBILINOGEN, NITRITE, LEUKOCYTESUR Sepsis Labs: @LABRCNTIP (procalcitonin:4,lacticidven:4)  ) Recent Results (from the  past 240 hour(s))  Resp Panel by RT-PCR (Flu A&B, Covid) Nasopharyngeal Swab     Status: None   Collection Time: 11/20/20 12:02 AM   Specimen: Nasopharyngeal Swab; Nasopharyngeal(NP) swabs in vial transport medium  Result Value Ref Range Status   SARS Coronavirus 2 by RT PCR NEGATIVE NEGATIVE Final    Comment: (NOTE) SARS-CoV-2 target nucleic acids are NOT DETECTED.  The SARS-CoV-2 RNA is generally detectable in upper respiratory specimens during the acute phase of infection. The lowest concentration of SARS-CoV-2 viral copies this assay can detect is 138 copies/mL. A negative result does not preclude SARS-Cov-2 infection and should not be used as the sole basis for treatment or other patient management decisions. A negative result may occur with  improper specimen collection/handling, submission of specimen other than nasopharyngeal swab, presence of viral mutation(s) within  the areas targeted by this assay, and inadequate number of viral copies(<138 copies/mL). A negative result must be combined with clinical observations, patient history, and epidemiological information. The expected result is Negative.  Fact Sheet for Patients:  EntrepreneurPulse.com.au  Fact Sheet for Healthcare Providers:  IncredibleEmployment.be  This test is no t yet approved or cleared by the [place] FDA and  has been authorized for detection and/or diagnosis of SARS-CoV-2 by FDA under an Emergency Use Authorization (EUA). This EUA will remain  in effect (meaning this test can be used) for the duration of the COVID-19 declaration under Section 564(b)(1) of the Act, 21 U.S.C.section 360bbb-3(b)(1), unless the authorization is terminated  or revoked sooner.       Influenza A by PCR NEGATIVE NEGATIVE Final   Influenza B by PCR NEGATIVE NEGATIVE Final    Comment: (NOTE) The Xpert Xpress SARS-CoV-2/FLU/RSV plus assay is intended as an aid in the diagnosis of  influenza from Nasopharyngeal swab specimens and should not be used as a sole basis for treatment. Nasal washings and aspirates are unacceptable for Xpert Xpress SARS-CoV-2/FLU/RSV testing.  Fact Sheet for Patients: EntrepreneurPulse.com.au  Fact Sheet for Healthcare Providers: IncredibleEmployment.be  This test is not yet approved or cleared by the [place] FDA and has been authorized for detection and/or diagnosis of SARS-CoV-2 by FDA under an Emergency Use Authorization (EUA). This EUA will remain in effect (meaning this test can be used) for the duration of the COVID-19 declaration under Section 564(b)(1) of the Act, 21 U.S.C. section 360bbb-3(b)(1), unless the authorization is terminated or revoked.  Performed at [facility], [place] [address], [place], [place] [address]   MRSA Next Gen by PCR, Nasal     Status: None   Collection Time: 11/20/20 10:18 AM   Specimen: Nasal Mucosa; Nasal Swab  Result Value Ref Range Status   MRSA by PCR Next Gen NOT DETECTED NOT DETECTED Final    Comment: (NOTE) The GeneXpert MRSA Assay (FDA approved for NASAL specimens only), is one component of a comprehensive MRSA colonization surveillance program. It is not intended to diagnose MRSA infection nor to guide or monitor treatment for MRSA infections. Test performance is not FDA approved in patients less than 80 years old. Performed at [facility], [place] [address] [place], [place] [address]      Scheduled Meds:  brinzolamide  1 drop Left Eye BID   And   brimonidine  1 drop Left Eye BID   Chlorhexidine Gluconate Cloth  6 each Topical Q0600   darbepoetin (ARANESP) injection - DIALYSIS  100 mcg Intravenous Q Fri-HD   ferric citrate  420 mg Oral BID WC   ketorolac  1 drop Left Eye QID   mouth rinse  15 mL Mouth Rinse BID   multivitamin  1 tablet Oral Daily   ofloxacin  1 drop Left Eye QID   pantoprazole  40 mg Oral BID   polyethylene  glycol  17 g Oral Daily   prednisoLONE acetate  1 drop Left Eye QID   sodium chloride flush  3 mL Intravenous Q12H   sucralfate  1 g Oral TID WC & HS   torsemide  10 mg Oral Daily   Continuous Infusions:  [START ON 11/26/2020] ferumoxytol       LOS: 5 days    Time spent: 108min  Domenic Polite, MD [facility]   11/25/2020, 11:49 AM

## 2020-11-25 NOTE — Progress Notes (Signed)
Progress Note  Patient Name: [name] Colon Date of Encounter: 11/25/2020  [facility] Cardiologist: Candee Furbish, MD   Subjective   Denies any chest pain or SOB.  NO dizziness or syncope  Inpatient Medications    Scheduled Meds:  brinzolamide  1 drop Left Eye BID   And   brimonidine  1 drop Left Eye BID   Chlorhexidine Gluconate Cloth  6 each Topical Q0600   darbepoetin (ARANESP) injection - DIALYSIS  100 mcg Intravenous Q Fri-HD   ferric citrate  420 mg Oral BID WC   ketorolac  1 drop Left Eye QID   mouth rinse  15 mL Mouth Rinse BID   multivitamin  1 tablet Oral Daily   ofloxacin  1 drop Left Eye QID   pantoprazole  40 mg Oral BID   polyethylene glycol  17 g Oral Daily   prednisoLONE acetate  1 drop Left Eye QID   sodium chloride flush  3 mL Intravenous Q12H   sucralfate  1 g Oral TID WC & HS   torsemide  10 mg Oral Daily   Continuous Infusions:  [START ON 11/26/2020] ferumoxytol     PRN Meds: acetaminophen **OR** acetaminophen, calcium carbonate (dosed in mg elemental calcium), camphor-menthol **AND** hydrOXYzine, docusate sodium, feeding supplement (NEPRO CARB STEADY), hydrALAZINE, lidocaine (PF), lidocaine-prilocaine, ondansetron **OR** ondansetron (ZOFRAN) IV, pentafluoroprop-tetrafluoroeth, sorbitol, zolpidem   Vital Signs    Vitals:   11/24/20 1936 11/24/20 2341 11/25/20 0428 11/25/20 0809  BP: (!) 149/58 (!) 136/55 132/60 (!) 114/49  Pulse: 78 73 73 78  Resp: 19 17 15 15   Temp: 97.8 F (36.6 C) 98.4 F (36.9 C) 98.3 F (36.8 C) 98.4 F (36.9 C)  TempSrc: Oral Oral Oral Oral  SpO2: 100% 99% 100% 99%  Weight:      Height:        Intake/Output Summary (Last 24 hours) at 11/25/2020 0831 Last data filed at 11/24/2020 1800 Gross per 24 hour  Intake 240 ml  Output 1 ml  Net 239 ml    Last 3 Weights 11/23/2020 11/22/2020 11/20/2020  Weight (lbs) 169 lb 12.1 oz 168 lb 10.4 oz 168 lb 10.4 oz  Weight (kg) 77 kg 76.5 kg 76.5 kg      Telemetry     NSR- Personally Reviewed  ECG    No new- Personally Reviewed  Physical Exam   GEN: Well nourished, well developed in no acute distress HEENT: Normal NECK: No JVD; No carotid bruits LYMPHATICS: No lymphadenopathy CARDIAC:RRR, no murmurs, rubs, gallops RESPIRATORY:  Clear to auscultation without rales, wheezing or rhonchi  ABDOMEN: Soft, non-tender, non-distended MUSCULOSKELETAL:  No edema; No deformity  SKIN: Warm and dry NEUROLOGIC:  Alert and oriented x 3 PSYCHIATRIC:  Normal affect   Labs    High Sensitivity Troponin:   Recent Labs  Lab 11/18/20 1146 11/18/20 1520 11/19/20 2341 11/20/20 0155  TROPONINIHS 44* 45* 84* 102*       Chemistry Recent Labs  Lab 11/18/20 1146 11/19/20 2341 11/20/20 0006 11/20/20 0827 11/21/20 0441 11/22/20 0044 11/23/20 0107 11/25/20 0059  NA 139 137   < > 138   < > 137 133* 133*  K 4.4 4.1   < > 4.4   < > 4.8 4.1 3.8  CL 104 105   < > 106   < > 102 97* 97*  CO2 23 21*  --  23   < > 26 24 24   GLUCOSE 97 179*   < > 113*   < >  92 86 84  BUN 79* 100*   < > 78*   < > 81* 46* 84*  CREATININE 9.78* 11.39*   < > 8.24*   < > 9.21* 5.83* 9.61*  CALCIUM 9.2 9.1  --  9.2   < > 8.5* 8.4* 8.2*  PROT 7.2 6.9  --   --   --   --   --   --   ALBUMIN 3.0* 3.0*  --  2.7*  --   --   --   --   AST 18 22  --   --   --   --   --   --   ALT 31 26  --   --   --   --   --   --   ALKPHOS 116 110  --   --   --   --   --   --   BILITOT 0.9 0.8  --   --   --   --   --   --   GFRNONAA 5* 4*  --  6*   < > 5* 9* 5*  ANIONGAP 12 11  --  9   < > 9 12 12    < > = values in this interval not displayed.      Hematology Recent Labs  Lab 11/23/20 0107 11/24/20 0052 11/25/20 0059  WBC 11.9* 11.1* 10.9*  RBC 2.21* 2.17* 2.05*  HGB 6.7* 6.9* 6.4*  HCT 20.3* 19.9* 18.8*  MCV 91.9 91.7 91.7  MCH 30.3 31.8 31.2  MCHC 33.0 34.7 34.0  RDW 15.1 15.2 15.0  PLT 119* 134* 129*     BNPNo results for input(s): BNP, PROBNP in the last 168 hours.   DDimer No  results for input(s): DDIMER in the last 168 hours.   Radiology    No results found.  Cardiac Studies   Echocardiogram this admission  1. Endocardial border tracking is poor resulting in inaccurate automated  LVEF assessment. Mild, global hypokinesis. Left ventricular ejection  fraction, by estimation, is 45 to 50%. The left ventricle has mildly  decreased function. The left ventricle  demonstrates global hypokinesis. Left ventricular diastolic parameters are  consistent with Grade I diastolic dysfunction (impaired relaxation).  Elevated left ventricular end-diastolic pressure.   2. Right ventricular systolic function is normal. The right ventricular  size is normal. There is moderately elevated pulmonary artery systolic  pressure.   3. Left atrial size was moderately dilated.   4. The mitral valve is normal in structure. Mild mitral valve  regurgitation. No evidence of mitral stenosis.   5. The aortic valve is tricuspid. There is mild calcification of the  aortic valve. There is mild thickening of the aortic valve. Aortic valve  regurgitation is mild. No aortic stenosis is present.   6. Aortic dilatation noted. There is borderline dilatation of the aortic  root, measuring 36 mm. There is mild dilatation of the ascending aorta,  measuring 37 mm.   7. The inferior vena cava is normal in size with greater than 50%  respiratory variability, suggesting right atrial pressure of 3 mmHg.  Patient Profile     80 y.o. male with what appears to be vasovagal syncope during hemodialysis with mildly reduced ejection fraction 45 to 50%  Assessment & Plan    Vasovagal syncope - This was accompanied by burning indigestion-like discomfort, nausea, hematemesis.   --had 2 episodes of unresponsiveness during a bout of nausea vomiting hematemesis during hemodialysis with bradycardia  noted into the 30s on telemetry consistent with vasovagal syncope.  He has classic prodrome of feeling poor,  sweating, nauseous. -- no further episodes of dizziness or syncope and c/w vasovagal syncope --no further cardiac workup at this time --Try to avoid offending vagal stimuli, avoid vomiting/hematemesis. --No indication for pacemaker.  Hematemesis/chest pain -- EGD showed ulcerated lesions at the GEJ secondary to [employer] tear treated with hemoclips --per GI  Chronic systolic heart failure - Mildly reduced ejection fraction 45 to 50% -- mild flat hsTrop elevation 44>45>84>102 likely demand ischemia in the setting of GI bleed, N/V -- no focal wall motion abnormality on echo with mild diffuse HK - Not a candidate for ACE inhibitor or Entresto spironolactone Jardiance given his end-stage renal disease status. - We are also holding off of AV nodal blocking agents such as metoprolol given his prior vasovagal syncope. -- workup of mild LV dysfunction can be done outpt  Anemia of renal disease - Hemoglobin 7.3.  He is Jehovah's Witness. 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  [facility] will sign off.   Medication Recommendations:  discharge meds to be determined by [facility] Other recommendations (labs, testing, etc):  none Follow up as an outpatient:  Dr. Marlou Porch in 1-2 weeks   For questions or updates, please contact [facility] Please consult [email or web address] for contact info under        Signed, Fransico Him, MD  11/25/2020, 8:31 AM

## 2020-11-25 NOTE — Plan of Care (Signed)
  Problem: Health Behavior/Discharge Planning: Goal: Ability to manage health-related needs will improve Outcome: Progressing   Problem: Clinical Measurements: Goal: Ability to maintain clinical measurements within normal limits will improve Outcome: Progressing Goal: Will remain free from infection Outcome: Progressing Goal: Diagnostic test results will improve Outcome: Progressing Goal: Respiratory complications will improve Outcome: Progressing Goal: Cardiovascular complication will be avoided Outcome: Progressing   Problem: Activity: Goal: Risk for activity intolerance will decrease Outcome: Progressing   Problem: Elimination: Goal: Will not experience complications related to bowel motility Outcome: Progressing   Problem: Pain Managment: Goal: General experience of comfort will improve Outcome: Progressing   Problem: Safety: Goal: Ability to remain free from injury will improve Outcome: Progressing   Problem: Skin Integrity: Goal: Risk for impaired skin integrity will decrease Outcome: Progressing

## 2020-11-26 DIAGNOSIS — K920 Hematemesis: Secondary | ICD-10-CM | POA: Diagnosis not present

## 2020-11-26 DIAGNOSIS — I5043 Acute on chronic combined systolic (congestive) and diastolic (congestive) heart failure: Secondary | ICD-10-CM | POA: Diagnosis not present

## 2020-11-26 DIAGNOSIS — E8770 Fluid overload, unspecified: Secondary | ICD-10-CM | POA: Diagnosis not present

## 2020-11-26 DIAGNOSIS — I10 Essential (primary) hypertension: Secondary | ICD-10-CM | POA: Diagnosis not present

## 2020-11-26 LAB — CBC
HCT: 20 % — ABNORMAL LOW (ref 39.0–52.0)
Hemoglobin: 6.7 g/dL — CL (ref 13.0–17.0)
MCH: 31.3 pg (ref 26.0–34.0)
MCHC: 33.5 g/dL (ref 30.0–36.0)
MCV: 93.5 fL (ref 80.0–100.0)
Platelets: 119 10*3/uL — ABNORMAL LOW (ref 150–400)
RBC: 2.14 MIL/uL — ABNORMAL LOW (ref 4.22–5.81)
RDW: 15.7 % — ABNORMAL HIGH (ref 11.5–15.5)
WBC: 11.2 10*3/uL — ABNORMAL HIGH (ref 4.0–10.5)
nRBC: 0.4 % — ABNORMAL HIGH (ref 0.0–0.2)

## 2020-11-26 LAB — BASIC METABOLIC PANEL
Anion gap: 6 (ref 5–15)
BUN: 33 mg/dL — ABNORMAL HIGH (ref 8–23)
CO2: 28 mmol/L (ref 22–32)
Calcium: 8.3 mg/dL — ABNORMAL LOW (ref 8.9–10.3)
Chloride: 96 mmol/L — ABNORMAL LOW (ref 98–111)
Creatinine, Ser: 5.8 mg/dL — ABNORMAL HIGH (ref 0.61–1.24)
GFR, Estimated: 9 mL/min — ABNORMAL LOW (ref >60)
Glucose, Bld: 94 mg/dL (ref 70–99)
Potassium: 4 mmol/L (ref 3.5–5.1)
Sodium: 130 mmol/L — ABNORMAL LOW (ref 135–145)

## 2020-11-26 MED ORDER — SODIUM CHLORIDE 0.9 % IV SOLN
510.0000 mg | Freq: Once | INTRAVENOUS | Status: AC
  Administered 2020-11-26: 510 mg via INTRAVENOUS
  Filled 2020-11-26 (×2): qty 17

## 2020-11-26 NOTE — TOC Initial Note (Signed)
Transition of Care [facility]) - Initial/Assessment Note    Patient Details  Name: [name] Colon MRN: [id number] Date of Birth: [birth year]  Transition of [facility] CM/SW Contact:    Zenon Mayo, RN Phone Number: 11/26/2020, 3:57 PM  Clinical Narrative:                 Patient is from home with daughter and her spouse, he will need a rolling walker, NCM made referral to [facility] with [employer] , this will be brought to his room.  NCM  offered choice for HHPT, daughter states they do not have a preference.  NCM made referral to [facility] with [place].  She is able to take referral for HHPT.  Soc will begin 24 to 48 hrs post dc. Patient will be transported home by daughter at dc.   Expected Discharge Plan: [place] Barriers to Discharge: Continued Medical Work up   Patient Goals and CMS Choice Patient states their goals for this hospitalization and ongoing recovery are:: to return home CMS Medicare.gov Compare Post Acute Care list provided to:: Patient Represenative (must comment) Choice offered to / list presented to : Adult Children  Expected Discharge Plan and Services Expected Discharge Plan: [place]   Discharge Planning Services: CM Consult Post Acute Care Choice: Home Health, Durable Medical Equipment Living arrangements for the past 2 months: Single Family Home                 DME Arranged: Walker rolling DME Agency: [employer] Date DME Agency Contacted: 11/26/20 Time DME Agency Contacted: 4 Representative spoke with at DME Agency: Adela Lank HH Arranged: PT [place]: [place] Date [place]: 11/26/20 Time HH Agency Contacted: 90 Representative spoke with at [place]: [place] Arrangements/Services Living arrangements for the past 2 months: [place] with:: Adult Children Patient language and need for interpreter reviewed:: Yes Do you feel safe going back to the place where  you live?: Yes      Need for Family Participation in Patient Care: Yes (Comment) Care giver support system in place?: Yes (comment)   Criminal Activity/Legal Involvement Pertinent to Current Situation/Hospitalization: No - Comment as needed  Activities of Daily Living Home Assistive Devices/Equipment: Blood pressure cuff, Cane (specify quad or straight), Eyeglasses, Scales ADL Screening (condition at time of admission) Patient's cognitive ability adequate to safely complete daily activities?: Yes Is the patient deaf or have difficulty hearing?: No Does the patient have difficulty seeing, even when wearing glasses/contacts?: No Does the patient have difficulty concentrating, remembering, or making decisions?: No Patient able to express need for assistance with ADLs?: Yes Does the patient have difficulty dressing or bathing?: No Independently performs ADLs?: Yes (appropriate for developmental age) Does the patient have difficulty walking or climbing stairs?: No Weakness of Legs: Both Weakness of Arms/Hands: None  Permission Sought/Granted                  Emotional Assessment Appearance:: Appears stated age Attitude/Demeanor/Rapport: Engaged Affect (typically observed): Appropriate Orientation: : Oriented to Self, Oriented to Place, Oriented to  Time, Oriented to Situation Alcohol / Substance Use: Not Applicable Psych Involvement: No (comment)  Admission diagnosis:  Acute pulmonary edema ([place]) [J81.0] Troponin level elevated [R77.8] Volume overload [E87.70] Patient Active Problem List   Diagnosis Date Noted   M[place]iss tear    Volume overload 11/20/2020   ESRD (end stage renal disease) on dialysis ([place]) 11/20/2020   Colon  cancer ([place]) 11/20/2020   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[place]) 11/20/2020   Hypertension 11/20/2020   Vasovagal episode 11/20/2020   Hematemesis 11/20/2020   Acute on chronic combined systolic and diastolic CHF (congestive heart failure) ([place])  11/20/2020   Acute pulmonary edema ([place])    PCP:  Leonel Ramsay, MD Pharmacy:   [facility] [place], [place] [address] [place] [address] Phone: [phone] Fax: [phone]     Social Determinants of Health (SDOH) Interventions    Readmission Risk Interventions Readmission Risk Prevention Plan 11/26/2020  Transportation Screening Complete  HRI or [place] Complete  Social Work Consult for [place] Planning/Counseling Complete  Palliative Care Screening Not Applicable  Medication Review Press photographer) Complete

## 2020-11-26 NOTE — Progress Notes (Addendum)
PROGRESS NOTE    [name] Colon  PRF:[id number] DOB: 11-27-1940 DOA: 11/19/2020 PCP: Leonel Ramsay, MD  Brief Narrative: 79 year old male with history of colon cancer, ESRD on hemodialysis, hep C/cirrhosis, hypertension presented to the ED with chest pain in the setting of missed hemodialysis, he reported periodic chest pain for couple of days, was seen in the emergency room on Monday, felt to be atypical, discharged home was unable to attend his normal dialysis treatment, presented back to the ED on 8/16 late night with worsening shortness of breath, started on BiPAP and nitro drip, was subsequently taken for urgent hemodialysis, while in dialysis had an episode of unresponsiveness while on BiPAP, quickly improved, following this he had a second episode of unresponsiveness during which he bradycardia down to the 30s, was started on CPR, he then vomited blood, he had about 1 minute of CPR before ROSC was achieved, subsequently transferred to stepdown unit. -Followed by gastroenterology, cardiology and nephrology this admission,.  Echo noted EF of 45-50%, cardiology recommended outpatient follow-up, underwent EGD by gastroenterology, noted to have gastroesophageal junction ulcers treated with hemoclips, PPI. -Hemoglobin dropped up to 6.4, is a Jehovah's Witness declines blood transfusions, treated with IV iron and EPO -Slowly improving  Subjective: -Feels tired, " I just do not feel good today, I just want to sleep" denies any chest pain nausea vomiting dyspnea diarrhea, no further melena  Assessment & Plan:   Volume overload -Secondary to missed hemodialysis -Improved after HD -Nephrology following -Clinically do not suspect pneumonia, discontinued Unasyn  -Dialyzed yesterday -Reportedly too tired today -Activity as tolerated, home health PT recommended  Vasovagal syncope -Bradycardia down to 30s the second episode, CODE BLUE was called, had 1 minute of CPR, ROSC was achieved -Echo  with mild cardiomyopathy and global left ventricular hypokinesis -Cardiology consulted, felt it was vasovagal, recommended outpatient follow-up for mild cardiomyopathy  Acute blood loss anemia, anemia of chronic disease Upper GI bleed, GEJ ulcers -Gastroenterology following, underwent endoscopy which noted ulcerated lesions at [place] [facility], treated with hemoclips X3 -Continue Protonix twice daily -Diet advanced -Hemoglobin 6.7 today -Continue EPO with HD, repeat dose of IV iron this morning, continue oral iron as well -CBC in a.m.  Chest pain Mildly elevated high-sensitivity troponin -echo with EF of 45-50% with global left ventricular hypokinesis -Cardiology recommended outpatient follow-up -Volume managed with HD  ESRD on hemodialysis -Nephrology consulting, dialyzed yesterday  Hep C, cirrhosis -Reportedly cured  DVT prophylaxis: SCDs Code Status: Full code Family Communication: No family at bedside, left message for daughter [name] and updated son today Disposition Plan:  Status is: Inpatient  Remains inpatient appropriate because:Inpatient level of care appropriate due to severity of illness  Dispo: The patient is from: Home              Anticipated d/c is to: Home              Patient currently is not medically stable to d/c.   Difficult to place patient No   Consultants:  -Cardiology Nephrology GI  Procedures: EGD 8/19: ulcerated lesions at [place] [facility], treated with hemoclips X3  Antimicrobials:   Objective: Vitals:   11/25/20 2007 11/25/20 2346 11/26/20 0406 11/26/20 1109  BP: (!) 157/69 (!) 144/52 123/73 136/61  Pulse: 73 70 73   Resp: 13 15 14 14   Temp: (!) 97.5 F (36.4 C) 98 F (36.7 C) 98.1 F (36.7 C) 98.3 F (36.8 C)  TempSrc: Oral Oral Oral Oral  SpO2: 100% 100% 99%  Weight:      Height:        Intake/Output Summary (Last 24 hours) at 11/26/2020 1113 Last data filed at 11/26/2020 0406 Gross per 24 hour  Intake --  Output 475 ml  Net -475 ml    Filed Weights   11/23/20 0520 11/25/20 1356 11/25/20 1830  Weight: 77 kg 74.6 kg 74.6 kg    Examination:  General exam: frail elderly male laying in bed, AAOx2, no distress HEENT: No JVD CVS: S1-S2, regular rate rhythm Lungs: Decreased breath sounds bases Abdomen: Soft, nontender, bowel sounds present Extremities: No edema  Psych: Appropriate mood and affect  Data Reviewed:   CBC: Recent Labs  Lab 11/19/20 2341 11/20/20 0006 11/23/20 0107 11/24/20 0052 11/25/20 0059 11/25/20 1329 11/26/20 0017  WBC 12.6*   < > 11.9* 11.1* 10.9* 10.8* 11.2*  NEUTROABS 10.5*  --   --   --   --   --   --   HGB 8.4*   < > 6.7* 6.9* 6.4* 7.0* 6.7*  HCT 25.3*   < > 20.3* 19.9* 18.8* 20.9* 20.0*  MCV 93.7   < > 91.9 91.7 91.7 93.3 93.5  PLT 180   < > 119* 134* 129* 134* 119*   < > = values in this interval not displayed.   Basic Metabolic Panel: Recent Labs  Lab 11/20/20 0827 11/21/20 0441 11/22/20 0044 11/23/20 0107 11/25/20 0059 11/26/20 0017  NA 138 138 137 133* 133* 130*  K 4.4 4.1 4.8 4.1 3.8 4.0  CL 106 99 102 97* 97* 96*  CO2 23 27 26 24 24 28   GLUCOSE 113* 82 92 86 84 94  BUN 78* 57* 81* 46* 84* 33*  CREATININE 8.24* 7.72* 9.21* 5.83* 9.61* 5.80*  CALCIUM 9.2 9.0 8.5* 8.4* 8.2* 8.3*  PHOS 3.5  --   --   --   --   --    GFR: Estimated Creatinine Clearance: 9.5 mL/min (A) (by C-G formula based on SCr of 5.8 mg/dL (H)). Liver Function Tests: Recent Labs  Lab 11/19/20 2341 11/20/20 0827  AST 22  --   ALT 26  --   ALKPHOS 110  --   BILITOT 0.8  --   PROT 6.9  --   ALBUMIN 3.0* 2.7*   No results for input(s): LIPASE, AMYLASE in the last 168 hours. No results for input(s): AMMONIA in the last 168 hours. Coagulation Profile: No results for input(s): INR, PROTIME in the last 168 hours. Cardiac Enzymes: No results for input(s): CKTOTAL, CKMB, CKMBINDEX, TROPONINI in the last 168 hours. BNP (last 3 results) No results for input(s): PROBNP in the last 8760  hours. HbA1C: No results for input(s): HGBA1C in the last 72 hours. CBG: Recent Labs  Lab 11/20/20 0947  GLUCAP 129*   Lipid Profile: No results for input(s): CHOL, HDL, LDLCALC, TRIG, CHOLHDL, LDLDIRECT in the last 72 hours. Thyroid Function Tests: No results for input(s): TSH, T4TOTAL, FREET4, T3FREE, THYROIDAB in the last 72 hours. Anemia Panel: No results for input(s): VITAMINB12, FOLATE, FERRITIN, TIBC, IRON, RETICCTPCT in the last 72 hours.  Urine analysis: No results found for: COLORURINE, APPEARANCEUR, LABSPEC, PHURINE, GLUCOSEU, HGBUR, BILIRUBINUR, KETONESUR, PROTEINUR, UROBILINOGEN, NITRITE, LEUKOCYTESUR Sepsis Labs: @LABRCNTIP (procalcitonin:4,lacticidven:4)  ) Recent Results (from the past 240 hour(s))  Resp Panel by RT-PCR (Flu A&B, Covid) Nasopharyngeal Swab     Status: None   Collection Time: 11/20/20 12:02 AM   Specimen: Nasopharyngeal Swab; Nasopharyngeal(NP) swabs in vial transport medium  Result Value Ref  Range Status   SARS Coronavirus 2 by RT PCR NEGATIVE NEGATIVE Final    Comment: (NOTE) SARS-CoV-2 target nucleic acids are NOT DETECTED.  The SARS-CoV-2 RNA is generally detectable in upper respiratory specimens during the acute phase of infection. The lowest concentration of SARS-CoV-2 viral copies this assay can detect is 138 copies/mL. A negative result does not preclude SARS-Cov-2 infection and should not be used as the sole basis for treatment or other patient management decisions. A negative result may occur with  improper specimen collection/handling, submission of specimen other than nasopharyngeal swab, presence of viral mutation(s) within the areas targeted by this assay, and inadequate number of viral copies(<138 copies/mL). A negative result must be combined with clinical observations, patient history, and epidemiological information. The expected result is Negative.  Fact Sheet for Patients:   EntrepreneurPulse.com.au  Fact Sheet for Healthcare Providers:  IncredibleEmployment.be  This test is no t yet approved or cleared by the [place] FDA and  has been authorized for detection and/or diagnosis of SARS-CoV-2 by FDA under an Emergency Use Authorization (EUA). This EUA will remain  in effect (meaning this test can be used) for the duration of the COVID-19 declaration under Section 564(b)(1) of the Act, 21 U.S.C.section 360bbb-3(b)(1), unless the authorization is terminated  or revoked sooner.       Influenza A by PCR NEGATIVE NEGATIVE Final   Influenza B by PCR NEGATIVE NEGATIVE Final    Comment: (NOTE) The Xpert Xpress SARS-CoV-2/FLU/RSV plus assay is intended as an aid in the diagnosis of influenza from Nasopharyngeal swab specimens and should not be used as a sole basis for treatment. Nasal washings and aspirates are unacceptable for Xpert Xpress SARS-CoV-2/FLU/RSV testing.  Fact Sheet for Patients: EntrepreneurPulse.com.au  Fact Sheet for Healthcare Providers: IncredibleEmployment.be  This test is not yet approved or cleared by the [place] FDA and has been authorized for detection and/or diagnosis of SARS-CoV-2 by FDA under an Emergency Use Authorization (EUA). This EUA will remain in effect (meaning this test can be used) for the duration of the COVID-19 declaration under Section 564(b)(1) of the Act, 21 U.S.C. section 360bbb-3(b)(1), unless the authorization is terminated or revoked.  Performed at [facility], [place] [address], [place], [place] [address]   MRSA Next Gen by PCR, Nasal     Status: None   Collection Time: 11/20/20 10:18 AM   Specimen: Nasal Mucosa; Nasal Swab  Result Value Ref Range Status   MRSA by PCR Next Gen NOT DETECTED NOT DETECTED Final    Comment: (NOTE) The GeneXpert MRSA Assay (FDA approved for NASAL specimens only), is one component of a  comprehensive MRSA colonization surveillance program. It is not intended to diagnose MRSA infection nor to guide or monitor treatment for MRSA infections. Test performance is not FDA approved in patients less than 60 years old. Performed at [facility], [place] [address], [place], [place] [address]      Scheduled Meds:  brinzolamide  1 drop Left Eye BID   And   brimonidine  1 drop Left Eye BID   Chlorhexidine Gluconate Cloth  6 each Topical Q0600   darbepoetin (ARANESP) injection - DIALYSIS  100 mcg Intravenous Q Fri-HD   ferric citrate  420 mg Oral BID WC   ketorolac  1 drop Left Eye QID   mouth rinse  15 mL Mouth Rinse BID   multivitamin  1 tablet Oral Daily   ofloxacin  1 drop Left Eye QID   pantoprazole  40 mg Oral BID   polyethylene glycol  17 g Oral Daily   prednisoLONE acetate  1 drop Left Eye QID   sodium chloride flush  3 mL Intravenous Q12H   torsemide  10 mg Oral Daily   Continuous Infusions:  ferumoxytol       LOS: 6 days    Time spent: 45min  Domenic Polite, MD [facility]   11/26/2020, 11:13 AM

## 2020-11-26 NOTE — Plan of Care (Signed)
  Problem: Health Behavior/Discharge Planning: Goal: Ability to manage health-related needs will improve Outcome: Progressing   Problem: Clinical Measurements: Goal: Ability to maintain clinical measurements within normal limits will improve Outcome: Progressing Goal: Will remain free from infection Outcome: Progressing Goal: Diagnostic test results will improve Outcome: Progressing Goal: Respiratory complications will improve Outcome: Progressing Goal: Cardiovascular complication will be avoided Outcome: Progressing   Problem: Activity: Goal: Risk for activity intolerance will decrease Outcome: Progressing   Problem: Elimination: Goal: Will not experience complications related to bowel motility Outcome: Progressing   Problem: Pain Managment: Goal: General experience of comfort will improve Outcome: Progressing   Problem: Safety: Goal: Ability to remain free from injury will improve Outcome: Progressing   Problem: Skin Integrity: Goal: Risk for impaired skin integrity will decrease Outcome: Progressing

## 2020-11-26 NOTE — Progress Notes (Signed)
Mobility Specialist: Progress Note   11/26/20 1802  Mobility  Activity Stood at bedside  Level of Assistance Minimal assist, patient does 75% or more  Assistive Device Front wheel walker  Mobility Out of bed to chair with meals  Mobility Response Tolerated fair  Mobility performed by Mobility specialist  $Mobility charge 1 Mobility   Pre-Mobility: Orthostatic Vitals           Supine: 74 HR, 128/48 BP           Seated EOB: 79 HR, 121/56 BP           Standing: 75 HR, 81/44 BP  Pt independent with bed mobility and required minA to stand from EOB. Unable to ambulate d/t pt being orthostatic and feeling dizzy while standing. Pt noted to be laterally unsteady while taking last BP so had pt sit down upon completion. Pt back to bed after with call bell and phone at his side.   [facility] [name] Colon Mobility Specialist Mobility Specialist Phone: [phone]

## 2020-11-26 NOTE — Progress Notes (Signed)
[facility] Progress Note   Assessment/ Plan:    OP HD: [facility] MWF   4h  77.5kg  3/2.0 bath  400/1.5  TDC  Hep 2400 Mircera 50 mcg q 4 weeks, last given 11/13/20 Calcitriol 0.75 mcg q rx Binder- Auryxia 210 mg 1 TID AC  Assessment/ Plan SOB/ respiratory distress: resolved w/ HD. Poss asp pneumonitis too Suspected UGIB: no documented varices 03/2020 in [facility]'s system.  GI consulting--> s/p EGD 8/19 with some bleeding ulcers, clipped.  Pt is a Jehovah's witness--> does NOT want blood products.  Would use caution with carafate in ESRD (can cause aluminum toxicity). 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 HD here Sat and Monday. Next HD tomorrow. No heparin.  HTN/ volume: not on any BP meds as OP per chart- pt's dtr says he's on amlodipine 10 mg daily.  Under dry wt, no vol^ on exam. Keep even on HD. Check orthostatics.  Anemia of ESRD:  Hgb dropped in setting of GI bleed, got Aranesp 100 mcg 11/22/20 with HD. s/p feraheme 510 mg IV on 8/20, 2nd dose due today 8/23, in setting of Tsat 37% Metabolic Bone Disease: calcitriol with rx, Auryxia as binder Hep C cirrhosis- follows at [facility], cured 2014 per their notes H/o prostate cancer H/o colon cancer  Kelly Splinter, MD 11/26/2020, 11:03 AM     Subjective:   No c/o today   Objective:   BP 123/73 (BP Location: Right Arm)   Pulse 73   Temp 98.1 F (36.7 C) (Oral)   Resp 14   Ht 5\' 8"  (1.727 m)   Wt 74.6 kg   SpO2 99%   BMI 25.01 kg/m   Physical Exam: GEN lying in bed, no distress HEENT EOMI PERRL NECK no JVD PULM clear  CV RRR ABD soft, mildly distended, improved EXT no LE edema NEURO AAO x 3 nonfocal ACCESS: R IJ TDC  Labs: BMET Recent Labs  Lab 11/19/20 2341 11/20/20 0006 11/20/20 0827 11/21/20 0441 11/22/20 0044 11/23/20 0107 11/25/20 0059 11/26/20 0017  NA 137 140 138 138 137 133* 133* 130*  K 4.1 4.0 4.4 4.1 4.8 4.1 3.8 4.0  CL 105  107 106 99 102 97* 97* 96*  CO2 21*  --  23 27 26 24 24 28   GLUCOSE 179* 166* 113* 82 92 86 84 94  BUN 100* 93* 78* 57* 81* 46* 84* 33*  CREATININE 11.39* 11.40* 8.24* 7.72* 9.21* 5.83* 9.61* 5.80*  CALCIUM 9.1  --  9.2 9.0 8.5* 8.4* 8.2* 8.3*  PHOS  --   --  3.5  --   --   --   --   --     CBC Recent Labs  Lab 11/19/20 2341 11/20/20 0006 11/24/20 0052 11/25/20 0059 11/25/20 1329 11/26/20 0017  WBC 12.6*   < > 11.1* 10.9* 10.8* 11.2*  NEUTROABS 10.5*  --   --   --   --   --   HGB 8.4*   < > 6.9* 6.4* 7.0* 6.7*  HCT 25.3*   < > 19.9* 18.8* 20.9* 20.0*  MCV 93.7   < > 91.7 91.7 93.3 93.5  PLT 180   < > 134* 129* 134* 119*   < > = values in this interval not displayed.       Medications:     brinzolamide  1 drop Left Eye BID   And   brimonidine  1 drop Left Eye BID  Chlorhexidine Gluconate Cloth  6 each Topical Q0600   darbepoetin (ARANESP) injection - DIALYSIS  100 mcg Intravenous Q Fri-HD   ferric citrate  420 mg Oral BID WC   ketorolac  1 drop Left Eye QID   mouth rinse  15 mL Mouth Rinse BID   multivitamin  1 tablet Oral Daily   ofloxacin  1 drop Left Eye QID   pantoprazole  40 mg Oral BID   polyethylene glycol  17 g Oral Daily   prednisoLONE acetate  1 drop Left Eye QID   sodium chloride flush  3 mL Intravenous Q12H   torsemide  10 mg Oral Daily

## 2020-11-27 DIAGNOSIS — B182 Chronic viral hepatitis C: Secondary | ICD-10-CM | POA: Diagnosis not present

## 2020-11-27 DIAGNOSIS — J810 Acute pulmonary edema: Secondary | ICD-10-CM | POA: Diagnosis not present

## 2020-11-27 DIAGNOSIS — E8770 Fluid overload, unspecified: Secondary | ICD-10-CM | POA: Diagnosis not present

## 2020-11-27 DIAGNOSIS — I5043 Acute on chronic combined systolic (congestive) and diastolic (congestive) heart failure: Secondary | ICD-10-CM | POA: Diagnosis not present

## 2020-11-27 LAB — CBC
HCT: 19.2 % — ABNORMAL LOW (ref 39.0–52.0)
Hemoglobin: 6.3 g/dL — CL (ref 13.0–17.0)
MCH: 31.3 pg (ref 26.0–34.0)
MCHC: 32.8 g/dL (ref 30.0–36.0)
MCV: 95.5 fL (ref 80.0–100.0)
Platelets: 133 10*3/uL — ABNORMAL LOW (ref 150–400)
RBC: 2.01 MIL/uL — ABNORMAL LOW (ref 4.22–5.81)
RDW: 16.2 % — ABNORMAL HIGH (ref 11.5–15.5)
WBC: 10.4 10*3/uL (ref 4.0–10.5)
nRBC: 0 % (ref 0.0–0.2)

## 2020-11-27 LAB — BASIC METABOLIC PANEL
Anion gap: 7 (ref 5–15)
BUN: 45 mg/dL — ABNORMAL HIGH (ref 8–23)
CO2: 27 mmol/L (ref 22–32)
Calcium: 8.3 mg/dL — ABNORMAL LOW (ref 8.9–10.3)
Chloride: 96 mmol/L — ABNORMAL LOW (ref 98–111)
Creatinine, Ser: 7.88 mg/dL — ABNORMAL HIGH (ref 0.61–1.24)
GFR, Estimated: 6 mL/min — ABNORMAL LOW (ref >60)
Glucose, Bld: 100 mg/dL — ABNORMAL HIGH (ref 70–99)
Potassium: 4.2 mmol/L (ref 3.5–5.1)
Sodium: 130 mmol/L — ABNORMAL LOW (ref 135–145)

## 2020-11-27 MED ORDER — HEPARIN SODIUM (PORCINE) 1000 UNIT/ML IJ SOLN
INTRAMUSCULAR | Status: AC
  Administered 2020-11-28: 3200 [IU] via INTRAVENOUS
  Filled 2020-11-27: qty 4

## 2020-11-27 NOTE — Progress Notes (Signed)
Attempted to call Langley Gauss to make her aware of the room change. No answer, will try again later

## 2020-11-27 NOTE — Progress Notes (Signed)
PROGRESS NOTE    [name] Colon  PJK:[id number] DOB: 1940-11-30 DOA: 11/19/2020 PCP: Leonel Ramsay, MD   Brief Narrative:   Patient is 80 years old male with history of colon cancer, end-stage renal disease on hemodialysis, hepatitis C cirrhosis of liver, hypertension presented to hospital with chest pain and had missed hemodialysis.  He also had worsening shortness of breath and initially required BiPAP and nitro drip.   During hospitalization patient had significant episode of bradycardia and required CPR.  Cardiology was consulted and recommended outpatient follow-up.  2D echocardiogram showed reduced LV function.  GI in nephrology followed the patient during hospitalization.  Patient underwent endoscopic evaluation with Hemoclip placement.  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  Secondary to missed hemodialysis.  Nephrology on board.  Continue hemodialysis.  On torsemide as per nephrology.  Vasovagal syncope Patient did have a significant bradycardia and CODE BLUE was called in 1 time with CPR.  2D echocardiogram with mildly reduced LV function Cardiology believes that it was a vasovagal and recommended outpatient follow-up for cardiomyopathy.   Acute blood loss anemia with underlying anemia of chronic disease Upper GI bleed, gastroesophageal junction ulcers GI on board and underwent endoscopy with Hemoclip placement x3.  Continue Protonix twice daily.  Diet has been advanced.  Patient is Jehovah's Witness and has refused blood transfusion.  Hemoglobin of 6.3 at this time.  Continue Epogen with hemodialysis and IV iron.    Chest pain with mildly elevated high-sensitivity troponin 2D echo with EF of 45-50% with global left ventricular hypokinesis.  Cardiology was consulted and recommended outpatient follow-up.  ESRD on hemodialysis Nephrology on board for hemodialysis.  Hep C,  cirrhosis Compensated at this time.  Debility, deconditioning with orthostatic hypotension seen by physical therapy.  Patient was mildly orthostatic.  Will likely need home health on discharge.  We will get orthostatic vitals x1.  If still orthostatic might need midodrine.  DVT prophylaxis:  SCDs  Code Status:  Full code  Family Communication:  None today.  Disposition Plan:  Status is: Inpatient  Remains inpatient appropriate because:Inpatient level of care appropriate due to severity of illness  Dispo: The patient is from: Home              Anticipated d/c is to: Home with home health in 1 to 2 days, continue hemoglobin monitoring, orthostatic vitals.              Patient currently is not medically stable to d/c.   Difficult to place patient No  Consultants:  Cardiology Nephrology GI  Procedures:  EGD 11/22/20 ulcerated lesions at [facility], treated with hemoclips X3  Antimicrobials:  None  Subjective:  Today, patient was seen and examined at bedside.  Patient denies any nausea vomiting abdominal pain.  Feels fatigue and tired.  Had a dizziness on standing yesterday.  Has not walked today.  Objective:  Vitals:   11/26/20 1932 11/26/20 1959 11/27/20 0001 11/27/20 0341  BP: (!) 133/59 (!) 125/57 (!) 118/40 (!) 126/56  Pulse: 72 72 68 79  Resp: 16 (!) 22 20 20   Temp: 98.2 F (36.8 C) 98.3 F (36.8 C) 98.1 F (36.7 C) 98.1 F (36.7 C)  TempSrc: Oral Oral Oral Oral  SpO2: 98% 98% 97% 97%  Weight:      Height:        Intake/Output Summary (Last 24 hours) at 11/27/2020 8099 Last  data filed at 11/27/2020 0600 Gross per 24 hour  Intake --  Output 575 ml  Net -575 ml    Filed Weights   11/23/20 0520 11/25/20 1356 11/25/20 1830  Weight: 77 kg 74.6 kg 74.6 kg    Physical examination: General: Frail elderly male,, not in obvious distress, communicative HENT:   Mild pallor noted.  Oral mucosa is moist.  Chest:  .  Diminished breath sounds bilaterally.  No  crackles or wheezes noted. CVS: S1 &S2 heard. No murmur.  Regular rate and rhythm. Abdomen: Soft, nontender, nondistended.  Bowel sounds are heard.   Extremities: No cyanosis, clubbing or edema.  Peripheral pulses are palpable. Psych: Alert, awake and communicative, normal mood CNS:  No cranial nerve deficits.  Power equal in all extremities.   Skin: Warm and dry.  No rashes noted.  Data Reviewed:  Following data were reviewed.  CBC: Recent Labs  Lab 11/24/20 0052 11/25/20 0059 11/25/20 1329 11/26/20 0017 11/27/20 0025  WBC 11.1* 10.9* 10.8* 11.2* 10.4  HGB 6.9* 6.4* 7.0* 6.7* 6.3*  HCT 19.9* 18.8* 20.9* 20.0* 19.2*  MCV 91.7 91.7 93.3 93.5 95.5  PLT 134* 129* 134* 119* 133*    Basic Metabolic Panel: Recent Labs  Lab 11/20/20 0827 11/21/20 0441 11/22/20 0044 11/23/20 0107 11/25/20 0059 11/26/20 0017 11/27/20 0025  NA 138   < > 137 133* 133* 130* 130*  K 4.4   < > 4.8 4.1 3.8 4.0 4.2  CL 106   < > 102 97* 97* 96* 96*  CO2 23   < > 26 24 24 28 27   GLUCOSE 113*   < > 92 86 84 94 100*  BUN 78*   < > 81* 46* 84* 33* 45*  CREATININE 8.24*   < > 9.21* 5.83* 9.61* 5.80* 7.88*  CALCIUM 9.2   < > 8.5* 8.4* 8.2* 8.3* 8.3*  PHOS 3.5  --   --   --   --   --   --    < > = values in this interval not displayed.    GFR: Estimated Creatinine Clearance: 7 mL/min (A) (by C-G formula based on SCr of 7.88 mg/dL (H)). Liver Function Tests: Recent Labs  Lab 11/20/20 0827  ALBUMIN 2.7*    No results for input(s): LIPASE, AMYLASE in the last 168 hours. No results for input(s): AMMONIA in the last 168 hours. Coagulation Profile: No results for input(s): INR, PROTIME in the last 168 hours. Cardiac Enzymes: No results for input(s): CKTOTAL, CKMB, CKMBINDEX, TROPONINI in the last 168 hours. BNP (last 3 results) No results for input(s): PROBNP in the last 8760 hours. HbA1C: No results for input(s): HGBA1C in the last 72 hours. CBG: Recent Labs  Lab 11/20/20 0947  GLUCAP 129*     Lipid Profile: No results for input(s): CHOL, HDL, LDLCALC, TRIG, CHOLHDL, LDLDIRECT in the last 72 hours. Thyroid Function Tests: No results for input(s): TSH, T4TOTAL, FREET4, T3FREE, THYROIDAB in the last 72 hours. Anemia Panel: No results for input(s): VITAMINB12, FOLATE, FERRITIN, TIBC, IRON, RETICCTPCT in the last 72 hours.  Urine analysis: No results found for: COLORURINE, APPEARANCEUR, LABSPEC, PHURINE, GLUCOSEU, HGBUR, BILIRUBINUR, KETONESUR, PROTEINUR, UROBILINOGEN, NITRITE, LEUKOCYTESUR Sepsis Labs: @LABRCNTIP (procalcitonin:4,lacticidven:4)  ) Recent Results (from the past 240 hour(s))  Resp Panel by RT-PCR (Flu A&B, Covid) Nasopharyngeal Swab     Status: None   Collection Time: 11/20/20 12:02 AM   Specimen: Nasopharyngeal Swab; Nasopharyngeal(NP) swabs in vial transport medium  Result Value Ref Range Status  SARS Coronavirus 2 by RT PCR NEGATIVE NEGATIVE Final    Comment: (NOTE) SARS-CoV-2 target nucleic acids are NOT DETECTED.  The SARS-CoV-2 RNA is generally detectable in upper respiratory specimens during the acute phase of infection. The lowest concentration of SARS-CoV-2 viral copies this assay can detect is 138 copies/mL. A negative result does not preclude SARS-Cov-2 infection and should not be used as the sole basis for treatment or other patient management decisions. A negative result may occur with  improper specimen collection/handling, submission of specimen other than nasopharyngeal swab, presence of viral mutation(s) within the areas targeted by this assay, and inadequate number of viral copies(<138 copies/mL). A negative result must be combined with clinical observations, patient history, and epidemiological information. The expected result is Negative.  Fact Sheet for Patients:  EntrepreneurPulse.com.au  Fact Sheet for Healthcare Providers:  IncredibleEmployment.be  This test is no t yet approved or cleared  by the [place] FDA and  has been authorized for detection and/or diagnosis of SARS-CoV-2 by FDA under an Emergency Use Authorization (EUA). This EUA will remain  in effect (meaning this test can be used) for the duration of the COVID-19 declaration under Section 564(b)(1) of the Act, 21 U.S.C.section 360bbb-3(b)(1), unless the authorization is terminated  or revoked sooner.       Influenza A by PCR NEGATIVE NEGATIVE Final   Influenza B by PCR NEGATIVE NEGATIVE Final    Comment: (NOTE) The Xpert Xpress SARS-CoV-2/FLU/RSV plus assay is intended as an aid in the diagnosis of influenza from Nasopharyngeal swab specimens and should not be used as a sole basis for treatment. Nasal washings and aspirates are unacceptable for Xpert Xpress SARS-CoV-2/FLU/RSV testing.  Fact Sheet for Patients: EntrepreneurPulse.com.au  Fact Sheet for Healthcare Providers: IncredibleEmployment.be  This test is not yet approved or cleared by the [place] FDA and has been authorized for detection and/or diagnosis of SARS-CoV-2 by FDA under an Emergency Use Authorization (EUA). This EUA will remain in effect (meaning this test can be used) for the duration of the COVID-19 declaration under Section 564(b)(1) of the Act, 21 U.S.C. section 360bbb-3(b)(1), unless the authorization is terminated or revoked.  Performed at [facility], [place] [address], [place], [place] [address]   MRSA Next Gen by PCR, Nasal     Status: None   Collection Time: 11/20/20 10:18 AM   Specimen: Nasal Mucosa; Nasal Swab  Result Value Ref Range Status   MRSA by PCR Next Gen NOT DETECTED NOT DETECTED Final    Comment: (NOTE) The GeneXpert MRSA Assay (FDA approved for NASAL specimens only), is one component of a comprehensive MRSA colonization surveillance program. It is not intended to diagnose MRSA infection nor to guide or monitor treatment for MRSA infections. Test performance  is not FDA approved in patients less than 74 years old. Performed at [facility], [place] [address] [place], [place] [address]       Scheduled Meds:  brinzolamide  1 drop Left Eye BID   And   brimonidine  1 drop Left Eye BID   Chlorhexidine Gluconate Cloth  6 each Topical Q0600   darbepoetin (ARANESP) injection - DIALYSIS  100 mcg Intravenous Q Fri-HD   ferric citrate  420 mg Oral BID WC   ketorolac  1 drop Left Eye QID   mouth rinse  15 mL Mouth Rinse BID   multivitamin  1 tablet Oral Daily   ofloxacin  1 drop Left Eye QID   pantoprazole  40 mg  Oral BID   polyethylene glycol  17 g Oral Daily   prednisoLONE acetate  1 drop Left Eye QID   sodium chloride flush  3 mL Intravenous Q12H   torsemide  10 mg Oral Daily   Continuous Infusions:    LOS: 7 days   Flora Lipps, MD [facility] 11/27/2020, 7:21 AM

## 2020-11-27 NOTE — Plan of Care (Signed)
  Problem: Health Behavior/Discharge Planning: Goal: Ability to manage health-related needs will improve Outcome: Progressing   Problem: Clinical Measurements: Goal: Ability to maintain clinical measurements within normal limits will improve Outcome: Progressing Goal: Will remain free from infection Outcome: Progressing Goal: Diagnostic test results will improve Outcome: Progressing Goal: Respiratory complications will improve Outcome: Progressing Goal: Cardiovascular complication will be avoided Outcome: Progressing   Problem: Activity: Goal: Risk for activity intolerance will decrease Outcome: Progressing   Problem: Elimination: Goal: Will not experience complications related to bowel motility Outcome: Progressing   Problem: Pain Managment: Goal: General experience of comfort will improve Outcome: Progressing   Problem: Safety: Goal: Ability to remain free from injury will improve Outcome: Progressing   Problem: Skin Integrity: Goal: Risk for impaired skin integrity will decrease Outcome: Progressing

## 2020-11-27 NOTE — Progress Notes (Signed)
PT Cancellation Note  Patient Details Name: [name] Colon MRN: [id number] DOB: 1940/09/05   Cancelled Treatment:    Reason Eval/Treat Not Completed: Patient at procedure or test/unavailable.  Gone to HD on arrival.  Will see 8/25 as able. 11/27/2020  [name]., PT Acute Rehabilitation Services [phone]  (pager) [phone]  (office)   [name] 11/27/2020, 3:28 PM

## 2020-11-27 NOTE — Progress Notes (Signed)
Occupational Therapy Treatment Patient Details Name: [name] Colon MRN: [id number] DOB: 1940-09-29 Today's Date: 11/27/2020    History of present illness Pt is an 80 year old man who came to the ED on 11/18/20 with chest pain, but tests were negative. He returned on 11/19/20 with chest pain, SOB requring bipap and nitroglycerin after missed HD session. Hospital course complicated by 2 episodes of unresponsiveness, CPR and vomiting blood associated during HD. PMH: colon cancer, ESRD, hep C, cirrhosis, HTN.   OT comments  Patient up in recliner stating he felt better and denied pain. Patient was supervision for sit to stand and to ambulate to sink with RW.  Patient performed grooming standing at sink with oral care and hand and face hygiene with supervision and monitored for changes in BP.  Patient required seated rest break before returning to sitting in recliner beside bed.  LB bathing performed with bathing feet with min assist and supervision for footwear.  Patient transferred to eob with supervision for safety and was modified Independent for sit to supine.   Follow Up Recommendations  No OT follow up    Equipment Recommendations  None recommended by OT    Recommendations for Other Services      Precautions / Restrictions Precautions Precautions: Fall Precaution Comments: watch orthostatics, mod fall Restrictions Weight Bearing Restrictions: No       Mobility Bed Mobility Overal bed mobility: Modified Independent Bed Mobility: Sit to Supine       Sit to supine: Modified independent (Device/Increase time);HOB elevated   General bed mobility comments: patient was modified independent with sit to supine with HOB up    Transfers Overall transfer level: Needs assistance Equipment used: Rolling walker (2 wheeled) Transfers: Sit to/from Stand Sit to Stand: Supervision Stand pivot transfers: Supervision       General transfer comment: no lob or unsteadiness with standing.  Reports no dizziness with standing this session.    Balance Overall balance assessment: Mild deficits observed, not formally tested Sitting-balance support: Feet supported Sitting balance-Leahy Scale: Normal     Standing balance support: Bilateral upper extremity supported;No upper extremity supported Standing balance-Leahy Scale: Good Standing balance comment: Patient used RW for mobility and was able to stand at sink to perform oral, face, and hand hygiene with supervision.                           ADL either performed or assessed with clinical judgement   ADL Overall ADL's : Needs assistance/impaired     Grooming: Wash/dry hands;Wash/dry face;Oral care;Supervision/safety;Standing Grooming Details (indicate cue type and reason): Patient was able to stand at sink to perform grooming with supervision for safety and to monitor for changes in BP     Lower Body Bathing: Minimal assistance;Set up Lower Body Bathing Details (indicate cue type and reason): Washing feet with min assist while seated     Lower Body Dressing: Supervision/safety Lower Body Dressing Details (indicate cue type and reason): supervison for to doff and donn socks while seated             Functional mobility during ADLs: Supervision/safety;Rolling walker General ADL Comments: monitored patient for orthostatic hypotension     Vision       Perception     Praxis      Cognition Arousal/Alertness: Awake/alert Behavior During Therapy: WFL for tasks assessed/performed Overall Cognitive Status: Within Functional Limits for tasks assessed  General Comments: pt pleasant and making appropriate conversation        Exercises     Shoulder Instructions       General Comments      Pertinent Vitals/ Pain       Pain Assessment: No/denies pain  Home Living                                          Prior Functioning/Environment               Frequency  Min 2X/week        Progress Toward Goals  OT Goals(current goals can now be found in the care plan section)  Progress towards OT goals: Progressing toward goals  Acute Rehab OT Goals Patient Stated Goal: return home OT Goal Formulation: With patient Time For Goal Achievement: 12/07/20 Potential to Achieve Goals: Good ADL Goals Pt Will Perform Grooming: with supervision;standing Pt Will Perform Lower Body Bathing: with supervision;sit to/from stand Pt Will Perform Lower Body Dressing: with supervision;sit to/from stand Pt Will Transfer to Toilet: with supervision;ambulating Pt Will Perform Toileting - Clothing Manipulation and hygiene: with supervision;sit to/from stand  Plan Discharge plan remains appropriate    Co-evaluation                 AM-PAC OT "6 Clicks" Daily Activity     Outcome Measure   Help from another person eating meals?: None Help from another person taking care of personal grooming?: A Little Help from another person toileting, which includes using toliet, bedpan, or urinal?: A Little Help from another person bathing (including washing, rinsing, drying)?: A Little Help from another person to put on and taking off regular upper body clothing?: None Help from another person to put on and taking off regular lower body clothing?: A Little 6 Click Score: 20    End of Session Equipment Utilized During Treatment: Gait belt;Rolling walker  OT Visit Diagnosis: Unsteadiness on feet (R26.81);History of falling (Z91.81)   Activity Tolerance Patient tolerated treatment well   Patient Left in bed;with call bell/phone within reach   Nurse Communication Mobility status        Time: 3833-3832 OT Time Calculation (min): 32 min  Charges: OT General Charges $OT Visit: 1 Visit OT Treatments $Self Care/Home Management : 23-37 mins  Lodema Hong, [place] 11/27/2020, 3:53 PM

## 2020-11-27 NOTE — Progress Notes (Signed)
  [facility] Progress Note   Assessment/ Plan:    OP HD: [facility] MWF   4h  77.5kg  3/2.0 bath  400/1.5  TDC  Hep 2400 Mircera 50 mcg q 4 weeks, last given 11/13/20 Calcitriol 0.75 mcg q rx Binder- Auryxia 210 mg 1 TID AC  Assessment/ Plan SOB/ respiratory distress: resolved w/ HD. Poss asp pneumonitis as well. Down 5kg since admit here.  UGIB: s/p EGD 8/19 with some bleeding gastric ulcers, clipped.  Pt is a [place] witness, refuses transfusion.  Would use caution with carafate in ESRD (can cause aluminum toxicity). Hb down to 6.3 today.  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 HD here Sat and Monday. HD today.  HTN/ volume: not on any BP meds as OP per chart.  Under dry wt, no vol^ on exam. Keep even on HD. Will dc torsemide w/ dropping wt's and lower BP's.   Anemia ckd + ABL - got Aranesp 100 mcg 11/22/20 with HD. s/p feraheme 510 mg IV on 8/20, 2nd dose due today 8/23, in setting of Tsat 59% Metabolic Bone Disease: calcitriol with rx, Auryxia as binder Hep C cirrhosis- follows at [facility], cured 2014 per their notes H/o prostate cancer H/o colon cancer  Kelly Splinter, MD 11/27/2020, 10:50 AM     Subjective:   No c/o today   Objective:   BP (!) 112/48   Pulse 75   Temp 98.5 F (36.9 C) (Oral)   Resp 18   Ht 5\' 8"  (1.727 m)   Wt 74.6 kg   SpO2 99%   BMI 25.01 kg/m   Physical Exam: GEN lying in bed, no distress HEENT EOMI PERRL NECK no JVD PULM clear  CV RRR ABD soft, mildly distended, improved EXT no LE edema NEURO AAO x 3 nonfocal [facility]Retta D[facility]  Labs: BMET Recent Labs  Lab 11/21/20 0441 11/22/20 0044 11/23/20 0107 11/25/20 0059 11/26/20 0017 11/27/20 0025  NA 138 137 133* 133* 130* 130*  K 4.1 4.8 4.1 3.8 4.0 4.2  CL 99 102 97* 97* 96* 96*  CO2 27 26 24 24 28 27   GLUCOSE 82 92 86 84 94 100*  BUN 57* 81* 46* 84* 33* 45*  CREATININE 7.72* 9.21* 5.83* 9.61* 5.80* 7.88*   CALCIUM 9.0 8.5* 8.4* 8.2* 8.3* 8.3*    CBC Recent Labs  Lab 11/25/20 0059 11/25/20 1329 11/26/20 0017 11/27/20 0025  WBC 10.9* 10.8* 11.2* 10.4  HGB 6.4* 7.0* 6.7* 6.3*  HCT 18.8* 20.9* 20.0* 19.2*  MCV 91.7 93.3 93.5 95.5  PLT 129* 134* 119* 133*       Medications:     brinzolamide  1 drop Left Eye BID   And   brimonidine  1 drop Left Eye BID   Chlorhexidine Gluconate Cloth  6 each Topical Q0600   darbepoetin (ARANESP) injection - DIALYSIS  100 mcg Intravenous Q Fri-HD   ferric citrate  420 mg Oral BID WC   ketorolac  1 drop Left Eye QID   mouth rinse  15 mL Mouth Rinse BID   multivitamin  1 tablet Oral Daily   ofloxacin  1 drop Left Eye QID   pantoprazole  40 mg Oral BID   polyethylene glycol  17 g Oral Daily   prednisoLONE acetate  1 drop Left Eye QID   sodium chloride flush  3 mL Intravenous Q12H   torsemide  10 mg Oral Daily

## 2020-11-28 ENCOUNTER — Inpatient Hospital Stay (HOSPITAL_COMMUNITY): Payer: Medicare Other

## 2020-11-28 DIAGNOSIS — N186 End stage renal disease: Secondary | ICD-10-CM | POA: Diagnosis not present

## 2020-11-28 DIAGNOSIS — I5043 Acute on chronic combined systolic (congestive) and diastolic (congestive) heart failure: Secondary | ICD-10-CM | POA: Diagnosis not present

## 2020-11-28 DIAGNOSIS — K226 Gastro-esophageal laceration-hemorrhage syndrome: Secondary | ICD-10-CM | POA: Diagnosis not present

## 2020-11-28 DIAGNOSIS — I10 Essential (primary) hypertension: Secondary | ICD-10-CM | POA: Diagnosis not present

## 2020-11-28 LAB — BLOOD GAS, ARTERIAL
Acid-base deficit: 6.1 mmol/L — ABNORMAL HIGH (ref 0.0–2.0)
Bicarbonate: 19.3 mmol/L — ABNORMAL LOW (ref 20.0–28.0)
FIO2: 40
O2 Saturation: 94.1 %
Patient temperature: 36.8
pCO2 arterial: 40.5 mmHg (ref 32.0–48.0)
pH, Arterial: 7.298 — ABNORMAL LOW (ref 7.350–7.450)
pO2, Arterial: 83.9 mmHg (ref 83.0–108.0)

## 2020-11-28 LAB — COMPREHENSIVE METABOLIC PANEL
ALT: 35 U/L (ref 0–44)
AST: 44 U/L — ABNORMAL HIGH (ref 15–41)
Albumin: 2.6 g/dL — ABNORMAL LOW (ref 3.5–5.0)
Alkaline Phosphatase: 142 U/L — ABNORMAL HIGH (ref 38–126)
Anion gap: 10 (ref 5–15)
BUN: 29 mg/dL — ABNORMAL HIGH (ref 8–23)
CO2: 25 mmol/L (ref 22–32)
Calcium: 8.9 mg/dL (ref 8.9–10.3)
Chloride: 98 mmol/L (ref 98–111)
Creatinine, Ser: 6.42 mg/dL — ABNORMAL HIGH (ref 0.61–1.24)
GFR, Estimated: 8 mL/min — ABNORMAL LOW (ref >60)
Glucose, Bld: 137 mg/dL — ABNORMAL HIGH (ref 70–99)
Potassium: 4.5 mmol/L (ref 3.5–5.1)
Sodium: 133 mmol/L — ABNORMAL LOW (ref 135–145)
Total Bilirubin: 0.6 mg/dL (ref 0.3–1.2)
Total Protein: 6.5 g/dL (ref 6.5–8.1)

## 2020-11-28 LAB — TROPONIN I (HIGH SENSITIVITY)
Troponin I (High Sensitivity): 696 ng/L (ref <18)
Troponin I (High Sensitivity): 1191 ng/L (ref <18)

## 2020-11-28 LAB — LACTIC ACID, PLASMA
Lactic Acid, Venous: 5.6 mmol/L (ref 0.5–1.9)
Lactic Acid, Venous: 3.5 mmol/L (ref 0.5–1.9)

## 2020-11-28 LAB — LIPASE, BLOOD: Lipase: 72 U/L — ABNORMAL HIGH (ref 11–51)

## 2020-11-28 LAB — HEMOGLOBIN AND HEMATOCRIT, BLOOD
HCT: 20.3 % — ABNORMAL LOW (ref 39.0–52.0)
Hemoglobin: 6.9 g/dL — CL (ref 13.0–17.0)

## 2020-11-28 LAB — BRAIN NATRIURETIC PEPTIDE: B Natriuretic Peptide: 2018.3 pg/mL — ABNORMAL HIGH (ref 0.0–100.0)

## 2020-11-28 LAB — GLUCOSE, CAPILLARY: Glucose-Capillary: 116 mg/dL — ABNORMAL HIGH (ref 70–99)

## 2020-11-28 MED ORDER — FUROSEMIDE 10 MG/ML IJ SOLN
INTRAMUSCULAR | Status: AC
  Filled 2020-11-28: qty 4

## 2020-11-28 MED ORDER — EPOETIN ALFA 20000 UNIT/ML IJ SOLN
20000.0000 [IU] | Freq: Every day | INTRAMUSCULAR | Status: DC
Start: 2020-11-28 — End: 2020-11-28

## 2020-11-28 MED ORDER — MIDODRINE HCL 5 MG PO TABS
10.0000 mg | ORAL_TABLET | Freq: Once | ORAL | Status: AC
  Administered 2020-11-28: 10 mg via ORAL
  Filled 2020-11-28: qty 2

## 2020-11-28 MED ORDER — NITROGLYCERIN 0.4 MG SL SUBL
0.4000 mg | SUBLINGUAL_TABLET | SUBLINGUAL | Status: DC | PRN
  Administered 2020-11-28: 0.4 mg via SUBLINGUAL

## 2020-11-28 MED ORDER — SODIUM CHLORIDE 0.9 % IV SOLN: 510.0000 mg | Freq: Once | INTRAVENOUS | Status: DC

## 2020-11-28 MED ORDER — HEPARIN SODIUM (PORCINE) 1000 UNIT/ML IJ SOLN
3200.0000 [IU] | Freq: Once | INTRAMUSCULAR | Status: AC
  Filled 2020-11-28: qty 3.2

## 2020-11-28 MED ORDER — NITROGLYCERIN 0.4 MG SL SUBL
SUBLINGUAL_TABLET | SUBLINGUAL | Status: AC
  Filled 2020-11-28: qty 1

## 2020-11-28 MED ORDER — FUROSEMIDE 10 MG/ML IJ SOLN
40.0000 mg | Freq: Once | INTRAMUSCULAR | Status: AC
  Administered 2020-11-28: 40 mg via INTRAVENOUS

## 2020-11-28 MED ORDER — FENTANYL CITRATE (PF) 100 MCG/2ML IJ SOLN
INTRAMUSCULAR | Status: AC
  Administered 2020-11-28: 25 ug
  Filled 2020-11-28: qty 2

## 2020-11-28 MED ORDER — ALBUMIN HUMAN 25 % IV SOLN
25.0000 g | Freq: Once | INTRAVENOUS | Status: AC
  Administered 2020-11-28: 25 g via INTRAVENOUS
  Filled 2020-11-28: qty 100

## 2020-11-28 MED ORDER — FENTANYL CITRATE (PF) 100 MCG/2ML IJ SOLN
25.0000 ug | INTRAMUSCULAR | Status: DC | PRN
  Administered 2020-11-28: 25 ug via INTRAVENOUS
  Filled 2020-11-28: qty 2

## 2020-11-28 MED ORDER — HEPARIN SODIUM (PORCINE) 1000 UNIT/ML DIALYSIS: 2400.0000 [IU] | Freq: Once | INTRAMUSCULAR | Status: DC

## 2020-11-28 NOTE — Progress Notes (Signed)
This RN was informed by the unit secretary that patient's son had called the unit and was concerned about when his father would be starting his bedside HD treatment. Wells Guiles, RN at bedside beginning patient's treatment. This RN called patient's son, [name], to provide an update of the status of patient's current treatment. Patient's son is aware that Wells Guiles is at the bedside. No further concerns at this time.   Dorthey Sawyer, RN  Dialysis Coordinator [phone]

## 2020-11-28 NOTE — Progress Notes (Signed)
[facility] Progress Note   Assessment/ Plan:    OP HD: [facility] MWF   4h  77.5kg  3/2.0 bath  400/1.5  TDC  Hep 2400 Mircera 50 mcg q 4 weeks, last given 11/13/20 Calcitriol 0.75 mcg q rx Binder- Auryxia 210 mg 1 TID AC  Assessment/ Plan SOB/ respiratory distress/ pulm edema/ vol overload: resolved w/ HD. Poss asp pneumonitis as well. Down 3kg from dry wt. Acute SOB this am, pulm edema, distress resolved w/ bipap.  Plan acute HD in room soon, lower volume further.  UGIB: s/p EGD 8/19 with some bleeding gastric ulcers, clipped.  Pt is a [place] witness, refuses transfusion.  Would use caution with carafate in ESRD (can cause aluminum toxicity). Hb down to 6.3 today.  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 HD here Sat and Monday and Wed. Acute HD today for pulm edema.  HTN/ volume: not on any BP meds as OP per chart.   Anemia ckd + ABL - got Aranesp 100 mcg 11/22/20 with HD. s/p feraheme 510 mg IV on 8/20, 2nd dose due today 8/23, in setting of Tsat 14% Metabolic Bone Disease: calcitriol with rx, Auryxia as binder Hep C cirrhosis- follows at [facility], cured 2014 per their notes H/o prostate cancer H/o colon cancer  Kelly Splinter, MD 11/28/2020, 9:31 AM     Subjective:   No c/o today   Objective:   BP (!) 104/59   Pulse (!) 102   Temp 98.2 F (36.8 C) (Oral)   Resp (!) 23   Ht 5\' 8"  (1.727 m)   Wt 74.6 kg   SpO2 100%   BMI 25.01 kg/m   Physical Exam: GEN lying in bed, no distress HEENT EOMI PERRL NECK no JVD PULM clear  CV RRR ABD soft, mildly distended, improved EXT no LE edema NEURO AAO x 3 nonfocal ACCESS: R [facility]  Labs: BMET Recent Labs  Lab 11/22/20 0044 11/23/20 0107 11/25/20 0059 11/26/20 0017 11/27/20 0025 11/28/20 0549  NA 137 133* 133* 130* 130* 133*  K 4.8 4.1 3.8 4.0 4.2 4.5  CL 102 97* 97* 96* 96* 98  CO2 26 24 24 28 27 25   GLUCOSE 92 86 84 94 100* 137*  BUN 81* 46*  84* 33* 45* 29*  CREATININE 9.21* 5.83* 9.61* 5.80* 7.88* 6.42*  CALCIUM 8.5* 8.4* 8.2* 8.3* 8.3* 8.9    CBC Recent Labs  Lab 11/25/20 0059 11/25/20 1329 11/26/20 0017 11/27/20 0025 11/28/20 0032  WBC 10.9* 10.8* 11.2* 10.4  --   HGB 6.4* 7.0* 6.7* 6.3* 6.9*  HCT 18.8* 20.9* 20.0* 19.2* 20.3*  MCV 91.7 93.3 93.5 95.5  --   PLT 129* 134* 119* 133*  --        Medications:     brinzolamide  1 drop Left Eye BID   And   brimonidine  1 drop Left Eye BID   Chlorhexidine Gluconate Cloth  6 each Topical Q0600   darbepoetin (ARANESP) injection - DIALYSIS  100 mcg Intravenous Q Fri-HD   ferric citrate  420 mg Oral BID WC   ketorolac  1 drop Left Eye QID   mouth rinse  15 mL Mouth Rinse BID   multivitamin  1 tablet Oral Daily   ofloxacin  1 drop Left Eye QID   pantoprazole  40 mg Oral BID   polyethylene glycol  17 g Oral Daily   prednisoLONE acetate  1 drop Left Eye QID  sodium chloride flush  3 mL Intravenous Q12H   torsemide  10 mg Oral Daily

## 2020-11-28 NOTE — Progress Notes (Signed)
PROGRESS NOTE    [name] Colon  CHY:[id number] DOB: [birth year] DOA: 11/19/2020 PCP: Leonel Ramsay, MD   Brief Narrative:   Patient is 80 years old male with history of colon cancer, end-stage renal disease on hemodialysis, hepatitis C cirrhosis of liver, hypertension presented to hospital with chest pain and had missed hemodialysis.  He also had worsening shortness of breath and initially required BiPAP and nitro drip.   During hospitalization patient had significant episode of bradycardia and required CPR.  Cardiology was consulted and recommended outpatient follow-up.  2D echocardiogram showed reduced LV function.  GI and nephrology followed the patient during hospitalization.  Patient underwent endoscopic evaluation with Hemoclip placement.  Patient is Jehovah's Witness and has been treated with IV iron and EPO.  During hospitalization, on 11/28/2020 patient had a rapid response called in for congestion, chest pain dyspnea.  Assessment & Plan:  Chest pain, congestion, tachycardia, dyspnea, elevated troponins with EKG changes with respiratory distress. Likely secondary to Non-ST elevation MI with pulmonary edema.  Patient had elevated troponin 2 with tachycardia and ST depression.  Cardiology was notified in the nighttime.  Called cardiology again.  Patient has significant anemia which could be contributing to ischemia as well.  He is Jehovah's Witness.  Patient might need cardiac catheterization but if he needs a stent, he might need dual antiplatelets.  Communicated with GI regarding this.  Patient recently had Hemoclip placement and Mallory-Weiss tear.  Currently on BiPAP.  Recent 2D echocardiogram with LV function of 45 to 50% with left ventricular hypokinesis.  Chest x-ray showed pulmonary vascular congestion.  Volume overload On presentation.  Secondary to missed hemodialysis.  Nephrology on board.  Continue hemodialysis as oer nephrology  Vasovagal syncope  2D echocardiogram  with mildly reduced LV function. Cardiology believes that it was a vasovagal and had recommended outpatient follow-up for cardiomyopathy.  Cardiology has been consulted again due to chest pain and elevated troponin and ST depression with pulmonary edema.  Acute blood loss anemia with underlying anemia of chronic disease Upper GI bleed, gastroesophageal junction ulcers GI on board and underwent endoscopy with Hemoclip placement x3.  Continue Protonix twice daily.  Diet has been advanced.  Patient is Jehovah's Witness and has refused blood transfusion.  Hemoglobin of 6.9 at this time.  Continue Epogen with hemodialysis and IV iron.  We will speak with hematology.  ESRD on hemodialysis Nephrology on board for hemodialysis.  Communicated with nephrology  Hep C, cirrhosis Compensated at this time.  Debility, deconditioning with orthostatic hypotension seen by physical therapy.    DVT prophylaxis:  SCDs  Code Status:  Full code  Family Communication:  I spoke with the patient's daughter on the phone and updated her about the clinical condition of the patient. I spoke about limitations on treatment.  Family requesting hematology opinion.  Disposition Plan:  Status is: Inpatient  Remains inpatient appropriate because:Inpatient level of care appropriate due to severity of illness  Dispo: The patient is from: Home              Anticipated d/c is to: Home with home health               Patient currently is not medically stable to d/c.   Difficult to place patient No  Consultants:  Cardiology Nephrology GI  Procedures:  EGD 11/22/20 ulcerated lesions at [employer], treated with hemoclips X3 Hemodialysis  Antimicrobials:  None  Subjective:  Today, patient was seen and examined at bedside.  Seen during  rapid response.  Patient complained of difficulty breathing, chest discomfort, pain and congestion.  Objective:  Vitals:   11/28/20 0700 11/28/20 0740 11/28/20 0745 11/28/20 0751   BP: 108/71 127/78 (!) 104/59 (!) 104/59  Pulse:    (!) 102  Resp: (!) 25 (!) 21 20 (!) 23  Temp:      TempSrc:      SpO2: 100% 93% 100% 100%  Weight:      Height:        Intake/Output Summary (Last 24 hours) at 11/28/2020 1017 Last data filed at 11/27/2020 1830 Gross per 24 hour  Intake --  Output 0 ml  Net 0 ml    Filed Weights   11/25/20 1830 11/27/20 1520 11/27/20 1853  Weight: 74.6 kg 74.6 kg 74.6 kg    Physical examination:  Frail elderly male, in moderate respiratory distress on BiPAP. HENT:   Mild pallor noted.  Oral mucosa is moist.  Chest: Coarse breath sounds noted bilaterally.   CVS: S1 &S2 heard. No murmur.  Regular rate and rhythm. Abdomen: Soft, nontender, nondistended.  Bowel sounds are heard.   Extremities: No cyanosis, clubbing or edema.  Peripheral pulses are palpable. Psych: Alert, awake and communicative, normal mood CNS:  No cranial nerve deficits.  Power equal in all extremities.   Skin: Warm and dry.  No rashes noted.  Data Reviewed:  Following data were reviewed.  CBC: Recent Labs  Lab 11/24/20 0052 11/25/20 0059 11/25/20 1329 11/26/20 0017 11/27/20 0025 11/28/20 0032  WBC 11.1* 10.9* 10.8* 11.2* 10.4  --   HGB 6.9* 6.4* 7.0* 6.7* 6.3* 6.9*  HCT 19.9* 18.8* 20.9* 20.0* 19.2* 20.3*  MCV 91.7 91.7 93.3 93.5 95.5  --   PLT 134* 129* 134* 119* 133*  --     Basic Metabolic Panel: Recent Labs  Lab 11/23/20 0107 11/25/20 0059 11/26/20 0017 11/27/20 0025 11/28/20 0549  NA 133* 133* 130* 130* 133*  K 4.1 3.8 4.0 4.2 4.5  CL 97* 97* 96* 96* 98  CO2 24 24 28 27 25   GLUCOSE 86 84 94 100* 137*  BUN 46* 84* 33* 45* 29*  CREATININE 5.83* 9.61* 5.80* 7.88* 6.42*  CALCIUM 8.4* 8.2* 8.3* 8.3* 8.9    GFR: Estimated Creatinine Clearance: 8.6 mL/min (A) (by C-G formula based on SCr of 6.42 mg/dL (H)). Liver Function Tests: Recent Labs  Lab 11/28/20 0549  AST 44*  ALT 35  ALKPHOS 142*  BILITOT 0.6  PROT 6.5  ALBUMIN 2.6*     Recent Labs  Lab 11/28/20 0549  LIPASE 72*   No results for input(s): AMMONIA in the last 168 hours. Coagulation Profile: No results for input(s): INR, PROTIME in the last 168 hours. Cardiac Enzymes: No results for input(s): CKTOTAL, CKMB, CKMBINDEX, TROPONINI in the last 168 hours. BNP (last 3 results) No results for input(s): PROBNP in the last 8760 hours. HbA1C: No results for input(s): HGBA1C in the last 72 hours. CBG: Recent Labs  Lab 11/28/20 0519  GLUCAP 116*    Lipid Profile: No results for input(s): CHOL, HDL, LDLCALC, TRIG, CHOLHDL, LDLDIRECT in the last 72 hours. Thyroid Function Tests: No results for input(s): TSH, T4TOTAL, FREET4, T3FREE, THYROIDAB in the last 72 hours. Anemia Panel: No results for input(s): VITAMINB12, FOLATE, FERRITIN, TIBC, IRON, RETICCTPCT in the last 72 hours.  Urine analysis: No results found for: COLORURINE, APPEARANCEUR, LABSPEC, PHURINE, GLUCOSEU, HGBUR, BILIRUBINUR, KETONESUR, PROTEINUR, UROBILINOGEN, NITRITE, LEUKOCYTESUR Sepsis Labs: @LABRCNTIP (procalcitonin:4,lacticidven:4)  ) Recent Results (from the past 240 hour(s))  Resp Panel by RT-PCR (Flu A&B, Covid) Nasopharyngeal Swab     Status: None   Collection Time: 11/20/20 12:02 AM   Specimen: Nasopharyngeal Swab; Nasopharyngeal(NP) swabs in vial transport medium  Result Value Ref Range Status   SARS Coronavirus 2 by RT PCR NEGATIVE NEGATIVE Final    Comment: (NOTE) SARS-CoV-2 target nucleic acids are NOT DETECTED.  The SARS-CoV-2 RNA is generally detectable in upper respiratory specimens during the acute phase of infection. The lowest concentration of SARS-CoV-2 viral copies this assay can detect is 138 copies/mL. A negative result does not preclude SARS-Cov-2 infection and should not be used as the sole basis for treatment or other patient management decisions. A negative result may occur with  improper specimen collection/handling, submission of specimen other than  nasopharyngeal swab, presence of viral mutation(s) within the areas targeted by this assay, and inadequate number of viral copies(<138 copies/mL). A negative result must be combined with clinical observations, patient history, and epidemiological information. The expected result is Negative.  Fact Sheet for Patients:  EntrepreneurPulse.com.au  Fact Sheet for Healthcare Providers:  IncredibleEmployment.be  This test is no t yet approved or cleared by the [place] FDA and  has been authorized for detection and/or diagnosis of SARS-CoV-2 by FDA under an Emergency Use Authorization (EUA). This EUA will remain  in effect (meaning this test can be used) for the duration of the COVID-19 declaration under Section 564(b)(1) of the Act, 21 U.S.C.section 360bbb-3(b)(1), unless the authorization is terminated  or revoked sooner.       Influenza A by PCR NEGATIVE NEGATIVE Final   Influenza B by PCR NEGATIVE NEGATIVE Final    Comment: (NOTE) The Xpert Xpress SARS-CoV-2/FLU/RSV plus assay is intended as an aid in the diagnosis of influenza from Nasopharyngeal swab specimens and should not be used as a sole basis for treatment. Nasal washings and aspirates are unacceptable for Xpert Xpress SARS-CoV-2/FLU/RSV testing.  Fact Sheet for Patients: EntrepreneurPulse.com.au  Fact Sheet for Healthcare Providers: IncredibleEmployment.be  This test is not yet approved or cleared by the [place] FDA and has been authorized for detection and/or diagnosis of SARS-CoV-2 by FDA under an Emergency Use Authorization (EUA). This EUA will remain in effect (meaning this test can be used) for the duration of the COVID-19 declaration under Section 564(b)(1) of the Act, 21 U.S.C. section 360bbb-3(b)(1), unless the authorization is terminated or revoked.  Performed at [facility], [place] [address] [place], [place] [address]    MRSA Next Gen by PCR, Nasal     Status: None   Collection Time: 11/20/20 10:18 AM   Specimen: Nasal Mucosa; Nasal Swab  Result Value Ref Range Status   MRSA by PCR Next Gen NOT DETECTED NOT DETECTED Final    Comment: (NOTE) The GeneXpert MRSA Assay (FDA approved for NASAL specimens only), is one component of a comprehensive MRSA colonization surveillance program. It is not intended to diagnose MRSA infection nor to guide or monitor treatment for MRSA infections. Test performance is not FDA approved in patients less than 24 years old. Performed at [facility], [place] [address] [place], [place] [address]       Scheduled Meds:  brinzolamide  1 drop Left Eye BID   And   brimonidine  1 drop Left Eye BID   Chlorhexidine Gluconate Cloth  6 each Topical Q0600   darbepoetin (ARANESP) injection - DIALYSIS  100 mcg Intravenous Q Fri-HD   ferric citrate  420 mg Oral BID WC  ketorolac  1 drop Left Eye QID   mouth rinse  15 mL Mouth Rinse BID   multivitamin  1 tablet Oral Daily   ofloxacin  1 drop Left Eye QID   pantoprazole  40 mg Oral BID   polyethylene glycol  17 g Oral Daily   prednisoLONE acetate  1 drop Left Eye QID   sodium chloride flush  3 mL Intravenous Q12H   torsemide  10 mg Oral Daily   Continuous Infusions:    LOS: 8 days   Flora Lipps, MD [facility] 11/28/2020, 10:17 AM

## 2020-11-28 NOTE — Progress Notes (Signed)
Progress Note  Patient Name: [name] Colon Date of Encounter: 11/28/2020  [facility] Cardiologist: Candee Furbish, MD   Subjective   Called back to see patient due to elevated hsTrop.  Complained of abdominal pain yesterday similar to what it was like when he was admitted.  Then started having chest pain 6/10 that was burning down into his abdomen with SOB with O2 sats 85% on 5L.  Cxray showed atelectasis and pulmonary vascular congestion with mild pulmonary edema.  EKG showed ST with marked ST abnormaity.  Respiratory status worsened and given IV Lasix and placed on BIPAP.  He had not been given ASA or heparin earlier due to uncertain diagnosis and recent upper GI bleed.  Seen by Nephrology this am.  Felt to possible have aspiration Pneumonitis.  Now with acute pulmonary edema.  Now getting emergent HD.  Inpatient Medications    Scheduled Meds:  brinzolamide  1 drop Left Eye BID   And   brimonidine  1 drop Left Eye BID   Chlorhexidine Gluconate Cloth  6 each Topical Q0600   darbepoetin (ARANESP) injection - DIALYSIS  100 mcg Intravenous Q Fri-HD   ferric citrate  420 mg Oral BID WC   ketorolac  1 drop Left Eye QID   mouth rinse  15 mL Mouth Rinse BID   multivitamin  1 tablet Oral Daily   ofloxacin  1 drop Left Eye QID   pantoprazole  40 mg Oral BID   polyethylene glycol  17 g Oral Daily   prednisoLONE acetate  1 drop Left Eye QID   sodium chloride flush  3 mL Intravenous Q12H   torsemide  10 mg Oral Daily   Continuous Infusions:   PRN Meds: acetaminophen **OR** acetaminophen, calcium carbonate (dosed in mg elemental calcium), camphor-menthol **AND** hydrOXYzine, docusate sodium, feeding supplement (NEPRO CARB STEADY), fentaNYL (SUBLIMAZE) injection, lidocaine (PF), lidocaine-prilocaine, nitroGLYCERIN, ondansetron **OR** ondansetron (ZOFRAN) IV, pentafluoroprop-tetrafluoroeth, sorbitol, zolpidem   Vital Signs    Vitals:   11/28/20 0700 11/28/20 0740 11/28/20 0745  11/28/20 0751  BP: 108/71 127/78 (!) 104/59 (!) 104/59  Pulse:    (!) 102  Resp: (!) 25 (!) 21 20 (!) 23  Temp:      TempSrc:      SpO2: 100% 93% 100% 100%  Weight:      Height:        Intake/Output Summary (Last 24 hours) at 11/28/2020 0942 Last data filed at 11/27/2020 1830 Gross per 24 hour  Intake --  Output 0 ml  Net 0 ml    Last 3 Weights 11/27/2020 11/27/2020 11/25/2020  Weight (lbs) 164 lb 7.4 oz 164 lb 7.4 oz 164 lb 7.4 oz  Weight (kg) 74.6 kg 74.6 kg 74.6 kg      Telemetry    Sinus tachycardia- Personally Reviewed  ECG    Sinus tachycardia with marked ST depression anteriorly- Personally Reviewed  Physical Exam   GEN: ill appearing currently getting HD HEENT: Normal NECK: No JVD; No carotid bruits LYMPHATICS: No lymphadenopathy CARDIAC:RRR, no murmurs, rubs, gallops RESPIRATORY:  decreased BS at bases ABDOMEN: Soft, non-tender, non-distended MUSCULOSKELETAL:  No edema; No deformity  SKIN: Warm and dry NEUROLOGIC:  Alert and oriented x 3 PSYCHIATRIC:  Normal affect   Labs    High Sensitivity Troponin:   Recent Labs  Lab 11/18/20 1520 11/19/20 2341 11/20/20 0155 11/28/20 0549 11/28/20 0808  TROPONINIHS 45* 84* 102* 696* 1,191*       Chemistry Recent Labs  Lab 11/26/20 0017  11/27/20 0025 11/28/20 0549  NA 130* 130* 133*  K 4.0 4.2 4.5  CL 96* 96* 98  CO2 28 27 25   GLUCOSE 94 100* 137*  BUN 33* 45* 29*  CREATININE 5.80* 7.88* 6.42*  CALCIUM 8.3* 8.3* 8.9  PROT  --   --  6.5  ALBUMIN  --   --  2.6*  AST  --   --  44*  ALT  --   --  35  ALKPHOS  --   --  142*  BILITOT  --   --  0.6  GFRNONAA 9* 6* 8*  ANIONGAP 6 7 10       Hematology Recent Labs  Lab 11/25/20 1329 11/26/20 0017 11/27/20 0025 11/28/20 0032  WBC 10.8* 11.2* 10.4  --   RBC 2.24* 2.14* 2.01*  --   HGB 7.0* 6.7* 6.3* 6.9*  HCT 20.9* 20.0* 19.2* 20.3*  MCV 93.3 93.5 95.5  --   MCH 31.3 31.3 31.3  --   MCHC 33.5 33.5 32.8  --   RDW 15.4 15.7* 16.2*  --   PLT  134* 119* 133*  --      BNP Recent Labs  Lab 11/28/20 0549  BNP 2,018.3*      DDimer No results for input(s): DDIMER in the last 168 hours.   Radiology    DG Chest Port 1 View  Result Date: 11/28/2020 CLINICAL DATA:  80 year old male with epigastric pain, shortness of breath and respiratory distress. Dialysis patient. EXAM: PORTABLE CHEST 1 VIEW COMPARISON:  Portable chest 11/19/2020 and earlier. FINDINGS: Portable AP semi upright view at 0618 hours. Larger lung volumes. Mediastinal contours are within normal limits. Stable dual lumen right chest dialysis type catheter. Improved bibasilar ventilation. Pulmonary vascularity mildly increased compared to 11/18/2020. Residual streaky opacity at the left lung base. No pneumothorax or definite pleural effusion. Visualized tracheal air column is within normal limits. No acute osseous abnormality identified. Small surgical clips in the epigastrium. Negative visible bowel gas. IMPRESSION: Improved lung volumes and bibasilar ventilation with residual streaky lung base opacity more resembling atelectasis than infection. Continued pulmonary vascular congestion with borderline to mild pulmonary edema now. Electronically Signed   By: Genevie Ann M.D.   On: 11/28/2020 06:37   DG Abd Portable 1V  Result Date: 11/28/2020 CLINICAL DATA:  80 year old male with epigastric pain, shortness of breath and respiratory distress. Dialysis patient. EXAM: PORTABLE ABDOMEN - 1 VIEW COMPARISON:  No prior abdominal radiographs. FINDINGS: Portable AP view at 0616 hours. 3 surgical clips are hemostasis clips project over the proximal stomach. Tiny surgical clip right lower quadrant. Non obstructed bowel gas pattern. Intermittent retained stool in the large bowel, with redundant sigmoid versus transverse colon suspected in the left lower quadrant. Other abdominal visceral contours appear normal. No acute osseous abnormality identified. Lumbar spine disc and endplate degeneration.  IMPRESSION: 1. Non obstructed bowel gas pattern with intermittent retained stool in the colon. 2. Hemostasis or surgical clips project over the stomach. Electronically Signed   By: Genevie Ann M.D.   On: 11/28/2020 06:38    Cardiac Studies   Echocardiogram this admission  1. Endocardial border tracking is poor resulting in inaccurate automated  LVEF assessment. Mild, global hypokinesis. Left ventricular ejection  fraction, by estimation, is 45 to 50%. The left ventricle has mildly  decreased function. The left ventricle  demonstrates global hypokinesis. Left ventricular diastolic parameters are  consistent with Grade I diastolic dysfunction (impaired relaxation).  Elevated left ventricular end-diastolic pressure.  2. Right ventricular systolic function is normal. The right ventricular  size is normal. There is moderately elevated pulmonary artery systolic  pressure.   3. Left atrial size was moderately dilated.   4. The mitral valve is normal in structure. Mild mitral valve  regurgitation. No evidence of mitral stenosis.   5. The aortic valve is tricuspid. There is mild calcification of the  aortic valve. There is mild thickening of the aortic valve. Aortic valve  regurgitation is mild. No aortic stenosis is present.   6. Aortic dilatation noted. There is borderline dilatation of the aortic  root, measuring 36 mm. There is mild dilatation of the ascending aorta,  measuring 37 mm.   7. The inferior vena cava is normal in size with greater than 50%  respiratory variability, suggesting right atrial pressure of 3 mmHg.  Patient Profile     80 y.o. male with what appears to be vasovagal syncope during hemodialysis with mildly reduced ejection fraction 45 to 50%  Assessment & Plan    Vasovagal syncope - This was accompanied by burning indigestion-like discomfort, nausea, hematemesis.   --had 2 episodes of unresponsiveness during a bout of nausea vomiting hematemesis during hemodialysis with  bradycardia noted into the 30s on telemetry consistent with vasovagal syncope.  He has classic prodrome of feeling poor, sweating, nauseous. --no further episodes of dizziness or syncope and c/w vasovagal syncope --no further cardiac workup at this time --Try to avoid offending vagal stimuli, avoid vomiting/hematemesis. --No indication for pacemaker.  Hematemesis/chest pain -- EGD showed ulcerated lesions at the GEJ secondary to [employer] tear treated with hemoclips -- had recurrent abdominal pain last night radiating into his chest that was burning similar to initial presentation --Hbg now down to 6.3 --patient is Jehovah's witness and refuses blood --? Whether he is having recurrent GI bleeding  Chronic systolic heart failure - Mildly reduced ejection fraction 45 to 50% -- initial mild flat hsTrop elevation 44>45>84>102  felt likely demand ischemia in the setting of GI bleed, N/V but had recurrent sx and now hsTrop [id number] -- suspect that elevated hsTrop still due to demand ischemia in the setting of severe anemia -- no focal wall motion abnormality on echo with mild diffuse HK recently  - Not a candidate for ACE inhibitor or Entresto spironolactone Jardiance given his end-stage renal disease status. - We are also holding off of AV nodal blocking agents such as metoprolol given his prior vasovagal syncope. -- very limited options at this time for assessment of elevated trop and EKG changes.  His Hbg is very low and with recent GIB he is likely not a candidate for DAPT if he is found to have CAD -- I will talk with interventional colleagues regarding cath but given low Hbg and refusal for blood products he will likely be deemed not to be a cath candidate -- please get GI to comment on safety of DAPT if we decide to move forward with cath and he is found to have CAD  Anemia of renal disease - Hemoglobin 7.3 a few days ago and now 6.3 -- He is Jehovah's Witness. -- exacerbated by GI  bleeding from [employer] tear  End-stage renal disease - On hemodialysis.  I have spent a total of 35 minutes with patient reviewing hospital notes , telemetry, EKGs, labs and examining patient as well as establishing an assessment and plan that was discussed with the patient.  > 50% of time was spent in direct patient care.  For questions or updates, please contact [place] Please consult [email or web address] for contact info under        Signed, Fransico Him, MD  11/28/2020, 9:42 AM

## 2020-11-28 NOTE — Progress Notes (Signed)
Pt c/o abd pain.  States it feels like gas pain.  Pt reports that he hasnt had a BM in several days.  Sorbitol po given.  Pt amendable to colace enema.  Given.  Pt placed on BSC.  Will monitor for bm.

## 2020-11-28 NOTE — Progress Notes (Addendum)
PT Cancellation Note  Patient Details Name: [name] Colon MRN: [id number] DOB: 07-31-1940   Cancelled Treatment:    Reason Eval/Treat Not Completed: Patient not medically ready (pt with rapid called, placed on Bipap and not appropriate)   [name] Theophilus Walz 11/28/2020, 7:37 AM [place] Pager: [phone] Office: [phone]

## 2020-11-28 NOTE — Progress Notes (Signed)
Spoke with Dr. Myna Hidalgo.  Discussed pt concerns and status.  Dr. Myna Hidalgo request RRT come see pt and advise.   He will see pt as soon as able.

## 2020-11-28 NOTE — Progress Notes (Signed)
   11/28/20 0500  Assess: MEWS Score  BP 107/62  ECG Heart Rate (!) 112  Resp (!) 27  SpO2 95 %  O2 Device Nasal Cannula  O2 Flow Rate (L/min) 2 L/min  Assess: MEWS Score  MEWS Temp 0  MEWS Systolic 0  MEWS Pulse 2  MEWS RR 2  MEWS LOC 0  MEWS Score 4  MEWS Score Color Red  Assess: if the MEWS score is Yellow or Red  Were vital signs taken at a resting state? No  Focused Assessment Change from prior assessment (see assessment flowsheet)  Early Detection of Sepsis Score *See Row Information* Low  MEWS guidelines implemented *See Row Information* Yes  Treat  MEWS Interventions Administered scheduled meds/treatments;Escalated (See documentation below)  Pain Scale 0-10  Pain Score 4  Pain Type Acute pain  Pain Location Abdomen  Pain Descriptors / Indicators Sharp  Pain Onset On-going  Patients Stated Pain Goal 0  Pain Intervention(s) Medication (See eMAR);MD notified (Comment)  Multiple Pain Sites No  Patients response to intervention Unchanged  Take Vital Signs  Increase Vital Sign Frequency  Red: Q 1hr X 4 then Q 4hr X 4, if remains red, continue Q 4hrs  Escalate  MEWS: Escalate Red: discuss with charge nurse/RN and provider, consider discussing with RRT  Notify: Charge Nurse/RN  Name of Charge Nurse/RN Notified Enid Derry  Date Charge Nurse/RN Notified 11/28/20  Time Charge Nurse/RN Notified 0500  Notify: Provider  Provider Name/Title Dr. Myna Hidalgo  Date Provider Notified 11/28/20  Time Provider Notified 0500  Notification Type Call  Notification Reason Change in status  Provider response En route  Date of Provider Response 11/28/20  Time of Provider Response 0500  Notify: Rapid Response  Name of Rapid Response RN Notified Mindy  Date Rapid Response Notified 11/28/20  Time Rapid Response Notified 0500  Document  Patient Outcome Stabilized after interventions  Progress note created (see row info) Yes

## 2020-11-28 NOTE — Progress Notes (Signed)
   11/28/20 0740  Assess: MEWS Score  BP 127/78  ECG Heart Rate (!) 120  Resp (!) 21  SpO2 93 %  O2 Device Nasal Cannula  O2 Flow Rate (L/min) 5 L/min  Assess: MEWS Score  MEWS Temp 0  MEWS Systolic 0  MEWS Pulse 2  MEWS RR 1  MEWS LOC 0  MEWS Score 3  MEWS Score Color Yellow  Assess: if the MEWS score is Yellow or Red  Were vital signs taken at a resting state? Yes  Focused Assessment Change from prior assessment (see assessment flowsheet)  Early Detection of Sepsis Score *See Row Information* Low  MEWS guidelines implemented *See Row Information* No, previously yellow, continue vital signs every 4 hours  Treat  MEWS Interventions Administered prn meds/treatments;Escalated (See documentation below)  Pain Scale 0-10  Pain Score 8  Pain Type Acute pain  Pain Location Chest  Pain Orientation Mid  Pain Descriptors / Indicators Sharp  Pain Frequency Constant  Pain Onset Sudden  Patients Stated Pain Goal 0  Pain Intervention(s) Medication (See eMAR) (SL Nitro)  Notify: Charge Nurse/RN  Name of Charge Nurse/RN Notified Tanya  Date Charge Nurse/RN Notified 11/28/20  Time Charge Nurse/RN Notified 0710  Notify: Provider  Provider Name/Title Dr Louanne Belton  Date Provider Notified 11/28/20  Time Provider Notified 0710  Notification Type Page  Notification Reason Change in status  Provider response See new orders  Date of Provider Response 11/28/20  Time of Provider Response 0715  Notify: Rapid Response  Name of Rapid Response RN Notified Mindy/Shelby  Date Rapid Response Notified 11/28/20  Time Rapid Response Notified 0710  Document  Patient Outcome Stabilized after interventions  Progress note created (see row info) Yes

## 2020-11-28 NOTE — Progress Notes (Signed)
Patient dropping BP early into our dialysis treatment this afternoon.  This usually means that there may be another cause of his acute resp failure other than volume overload. Top suspects are cardiac ischemia (flash pulm edema w/o vol overload) and aspiration pneumonitis (xray changes can be identical to CHF).  Will cont dialysis and use other measures (IV albumin) to get fluid off if possible.   Kelly Splinter, MD 11/28/2020, 1:38 PM

## 2020-11-28 NOTE — Progress Notes (Signed)
Pt c/o abd pain, states he feels like it did when he was admitted.  HR 110-120, ST.  BP 124/64.  Pt requesting MD to bedside.  Dr. Myna Hidalgo text paged.  Will continue to monitor.

## 2020-11-28 NOTE — Significant Event (Addendum)
Rapid Response Event Note   Reason for Call :  Second set of eyes.   Patient reports generally not feeling well- like he did when he came in.   Initial Focused Assessment:  On my arrival, patient is alert and oriented, laying in bed, in moderate respiratory distress. He complains of 6/10 CP that burns down to his bellybutton, as well as SOB. Lungs are clear but diminished on the right, he has active BS, reports prior constipation and currently needs to have BM.   VS: HR 112, BP 107/62, Sats 85% on [place] 5L, RR 26.  CBG: 116  Interventions:  Patient placed on 6L [place]- now satting 95%.  STAT CXR: Improved lung volumes and bibasilar ventilation with residual streaky lung base opacity more resembling atelectasis than infection. Continued pulmonary vascular congestion with borderline to mild pulmonary edema now. STAT EKG: Sinus tachycardia. Marked ST abnormality, possible anterior subendocardial injury. Abnormal ECG.  KUB: Non obstructed bowel gas pattern with intermittent retained stool in the colon. Hemostasis or surgical clips project over the stomach. Troponin(resulted 696- cardiology consulted by MD), CMP, BNP, and lipase ordered by Dr. Myna Hidalgo.   Plan of Care:  Send ordered labs, await CXR/KUB results.  Continue to monitor patient closely.  Please call RRT if further assistance is needed.   Event Summary:   MD Notified: Dr. Myna Hidalgo at [phone], came to bedside to assess.  Call Time: 0456 Arrival Time: 0503 End Time: 3358  [name] Gladwin, RN    UPDATE: Called back to bedside at [phone] for worsening status. Patient tachypneic in the 40's, Sats >90% on [place], tachycardic in the 140's, BP 108/71. Patient has severely congested breathing- NTSx2 with small amount of tan secretions, diaphoretic, and unable to speak d/t amount of respiratory distress. Repeat EKG completed, no changes from prior. MD called, ordered ABG, Lactic, Lasix and BiPAP. MD is coming to bedside to assess.

## 2020-11-28 NOTE — Progress Notes (Addendum)
Cross-coverage note:   Notified by RN that "patient feels like he did when he was admitted."    He has hx of ESRD and hep C cirrhosis, presented 8 days ago with chest pain, found to have volume overload and severe HTN in the setting of missed HD, was admitted on BiPAP and NTG drip, underwent urgent HD where he had transient LOC with HR 30s attributed to vasovagal syncope by cardiology, had hematemesis s/p EGD with actively bleeding ulcers clipped on 8/19, and now complains of burning epigastric and chest discomfort and SOB that kept him from sleeping last night. He thought it was gas.   His breathing appeared labored, CXR, KUB, EKG, chem panel, and troponins were ordered by rapid response team and he was started on O2.   He appears uncomfortable but not in distress or diaphoretic. He is in SR with rate 110s, RR 20s, sat 98% on 2 Lpm, and BP 107/62. Breath sounds diminished, no wheezes or rhonchi. Abd soft with no rebound pain or guarding.   EKG with ST depressions.   Plan to treat pain and check CXR, troponin, BNP, CMP, and lipase.    UPDATE: Labs and imaging back, most notable for troponin 696. He had not been given ASA or heparin earlier due to uncertain diagnosis and recent upper GI bleed. Discussed with cardiology who will plan to see him, appreciate their assistance.

## 2020-11-28 NOTE — Progress Notes (Signed)
Called pt daughter [name] Colon and updated on pt status and tests being run.  Advised daughter that we would keep her posted.  She spoke with pt.  Daughter appreciative of updates and would like to be informed of tests results.

## 2020-11-29 DIAGNOSIS — E8770 Fluid overload, unspecified: Secondary | ICD-10-CM | POA: Diagnosis not present

## 2020-11-29 DIAGNOSIS — Z7189 Other specified counseling: Secondary | ICD-10-CM | POA: Diagnosis not present

## 2020-11-29 DIAGNOSIS — B182 Chronic viral hepatitis C: Secondary | ICD-10-CM | POA: Diagnosis not present

## 2020-11-29 DIAGNOSIS — N186 End stage renal disease: Secondary | ICD-10-CM | POA: Diagnosis not present

## 2020-11-29 DIAGNOSIS — Z515 Encounter for palliative care: Secondary | ICD-10-CM

## 2020-11-29 DIAGNOSIS — I5043 Acute on chronic combined systolic (congestive) and diastolic (congestive) heart failure: Secondary | ICD-10-CM | POA: Diagnosis not present

## 2020-11-29 DIAGNOSIS — J810 Acute pulmonary edema: Secondary | ICD-10-CM | POA: Diagnosis not present

## 2020-11-29 DIAGNOSIS — K226 Gastro-esophageal laceration-hemorrhage syndrome: Secondary | ICD-10-CM | POA: Diagnosis not present

## 2020-11-29 DIAGNOSIS — I10 Essential (primary) hypertension: Secondary | ICD-10-CM | POA: Diagnosis not present

## 2020-11-29 LAB — CBC
HCT: 21.4 % — ABNORMAL LOW (ref 39.0–52.0)
Hemoglobin: 7 g/dL — ABNORMAL LOW (ref 13.0–17.0)
MCH: 31.8 pg (ref 26.0–34.0)
MCHC: 32.7 g/dL (ref 30.0–36.0)
MCV: 97.3 fL (ref 80.0–100.0)
Platelets: 145 10*3/uL — ABNORMAL LOW (ref 150–400)
RBC: 2.2 MIL/uL — ABNORMAL LOW (ref 4.22–5.81)
RDW: 19.1 % — ABNORMAL HIGH (ref 11.5–15.5)
WBC: 16.9 10*3/uL — ABNORMAL HIGH (ref 4.0–10.5)
nRBC: 0 % (ref 0.0–0.2)

## 2020-11-29 LAB — MAGNESIUM: Magnesium: 2 mg/dL (ref 1.7–2.4)

## 2020-11-29 LAB — BASIC METABOLIC PANEL
Anion gap: 9 (ref 5–15)
BUN: 23 mg/dL (ref 8–23)
CO2: 27 mmol/L (ref 22–32)
Calcium: 8.9 mg/dL (ref 8.9–10.3)
Chloride: 97 mmol/L — ABNORMAL LOW (ref 98–111)
Creatinine, Ser: 5.19 mg/dL — ABNORMAL HIGH (ref 0.61–1.24)
GFR, Estimated: 10 mL/min — ABNORMAL LOW (ref >60)
Glucose, Bld: 82 mg/dL (ref 70–99)
Potassium: 4.1 mmol/L (ref 3.5–5.1)
Sodium: 133 mmol/L — ABNORMAL LOW (ref 135–145)

## 2020-11-29 MED ORDER — FOLIC ACID 1 MG PO TABS
1.0000 mg | ORAL_TABLET | Freq: Every day | ORAL | Status: DC
  Administered 2020-11-29 – 2020-12-03 (×5): 1 mg via ORAL
  Filled 2020-11-29 (×5): qty 1

## 2020-11-29 NOTE — Progress Notes (Signed)
Pt had 13 beat run vtach  Md notified

## 2020-11-29 NOTE — Progress Notes (Signed)
Pt stable respiratory status is good.  No issues to report at this time.

## 2020-11-29 NOTE — Consult Note (Signed)
Palliative Medicine Inpatient Consult Note  Consulting Provider: Flora Lipps, MD  Reason for consult:   [facility] Consult  Reason for Consult? Goals of care.   HPI:  Per intake H&P --> Patient is 80 years old male with history of colon cancer, end-stage renal disease on hemodialysis, hepatitis C cirrhosis of liver, hypertension presented to hospital with chest pain and had missed hemodialysis.  He also had worsening shortness of breath and initially required BiPAP and nitro drip.  During hospitalization patient had significant episode of bradycardia and required CPR.  Cardiology was consulted and recommended outpatient follow-up.  2D echocardiogram showed reduced LV function.  GI and nephrology followed the patient during hospitalization.  Patient underwent endoscopic evaluation with Hemoclip placement.  Patient is Jehovah's Witness and has been treated with IV iron and EPO.  Palliative care has been asked to get involved to further address goals of care in the setting of multiple co-morbidities and a declining health state.   Clinical Assessment/Goals of Care:  *Please note that this is a verbal dictation therefore any spelling or grammatical errors are due to the "[place]" system interpretation.  I have reviewed medical records including EPIC notes, labs and imaging, received report from bedside RN, assessed the patient who is lying in bed in NAD.    I met with Angelica Pou, Thamas Jaegers and her spouse at bedside to further discuss diagnosis prognosis, GOC, EOL wishes, disposition and options.  A review of patients chronic co-morbid conditions was completed. We discussed his h/o hepatitis C and how he started receiving treatments later in life, we reviewed his prior colon cancer and prostate enlargement. He shares with me the trials of being on hemodialysis. Both he and his daughter reviewed that dialysis is hard on him some  days but it is worth it to maintain in this world.   [name] expresses that she feels the Nephrology team has not gotten his "dry weight right and this is the culprit of all of Cato's recent health scares.   We reviewed the reason(s) for hospitalization and a more recent update. Abijah understands that he may need a heart catheterization though presently he is unable to get this due to his low red blood cell count.    I introduced Palliative Medicine as specialized medical care for people living with serious illness. It focuses on providing relief from the symptoms and stress of a serious illness. The goal is to improve quality of life for both the patient and the family.  Ido shares with me that he is from [place], [place]. He lives in [place] for [place] 20 years and [place], [place] for > 30 years. He is a widower. He has two daughters and one son. He is a grandfather of six. 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 He is a man of strong faith and practices as a Restaurant manager, fast food.   Prior to hospitalization, Raunel had been living with his daughter, [name]. He is fully functional at home and uses a can for mobility due to poor balance. He has good nutritional intake in his home per he and his daughter.  A detailed discussion was had today regarding advanced directives - Gwynn has completed these through the [place] notary and we have obtained a copy to be placed in [place].    Concepts specific to code status, artifical feeding and hydration, continued IV antibiotics and rehospitalization was had.  We reviewed a MOST form in detail. As of present, [place] would with for treatments to be provided that could lead to long term benefits. He would at this time like to received CPR.    Personal goals of Kamir are to maintain involvements in his faith community and maintain autonomy.   Patients daughter requests a  medical update regarding initiating folic acid, drawing blood in pediatric tubes, and establishing a plan with nephrology and hematology to manage anemia. I shared that I will pass this along to Dr. Louanne Belton  Discussed the importance of continued conversation with family and their  medical providers regarding overall plan of care and treatment options, ensuring decisions are within the context of the patients values and GOCs.  Decision Maker: Patient can presently make decisions for himself though if unable to do so would rely on his daughter --> Thamas Jaegers (Daughter) [phone]  SUMMARY OF RECOMMENDATIONS   Full code / full scope of treatment  MOST form reviewed with family, they will complete prior to [place] in media section of chart will be scanned into [employer]  Appreciate Dr. Louanne Belton providing the family with a medical update  TOC - OP Palliative support  Ongoing support  Code Status/Advance Care Planning: FULL CODE   Palliative Prophylaxis:  Oral care, Mobility   Additional Recommendations (Limitations, Scope, Preferences): Continue to provide full treatment    Psycho-social/Spiritual:  Desire for further Chaplaincy support: No - patient has a shepard Additional Recommendations: Education on chronic diseases   Prognosis: Unclear  Discharge Planning: Discharge will be to home with [facility] when medically optimized.  Vitals:   11/29/20 0341 11/29/20 0400  BP: (!) 114/55 (!) 99/45  Pulse: 79   Resp: 15 15  Temp: 98.7 F (37.1 C)   SpO2: 100% 100%    Intake/Output Summary (Last 24 hours) at 11/29/2020 0731 Last data filed at 11/28/2020 2300 Gross per 24 hour  Intake 103 ml  Output 2114 ml  Net -2011 ml   Last Weight  Most recent update: 11/29/2020  7:07 AM    Weight  73.4 kg (161 lb 13.1 oz)            Gen:  Elderly AA M in NAD HEENT: moist mucous membranes CV: Regular rate and rhythm  PULM: On RA ABD: soft/nontender  EXT: No edema   Neuro: Alert and oriented x3   PPS: 50%   This conversation/these recommendations were discussed with patient primary care team, Dr. Louanne Belton  Time In: 1015 Time Out: 1134 Total Time: 79 Greater than 50%  of this time was spent counseling and coordinating care related to the above assessment and plan.  [facility] Team Team Cell Phone: [phone] Please utilize secure chat with additional questions, if there is no response within 30 minutes please call the above phone number  Palliative Medicine Team providers are available by phone from 7am to 7pm daily and can be reached through the team cell phone.  Should this patient require assistance outside of these hours, please call the patient's attending physician.

## 2020-11-29 NOTE — Progress Notes (Signed)
PT Cancellation Note  Patient Details Name: Anthany Thornhill MRN: [id number] DOB: [birth year]   Cancelled Treatment:    Reason Eval/Treat Not Completed: Patient at procedure or test/unavailable (pt in palliative care meeting with family)   Sandy Salaam Halen Mossbarger 11/29/2020, 10:31 AM Bayard Males, PT Acute Rehabilitation Services Pager: [phone] Office: [phone]

## 2020-11-29 NOTE — Progress Notes (Signed)
[facility] Progress Note   Assessment/ Plan:    OP HD: [facility] MWF   4h  77.5kg  3/2.0 bath  400/1.5  TDC  Hep 2400 Mircera 50 mcg q 4 weeks, last given 11/13/20 Calcitriol 0.75 mcg q rx Binder- Auryxia 210 mg 1 TID AC  Assessment/ Plan SOB/ respiratory distress/ pulm edema/ vol overload: resolved w/ HD. Then had another episode yest 8/25, responded well to HD w/ 2.0 L UF. Bipap dc'd, on nasal cannula today. 4kg under prior dry wt. Cont to lower vol w/ HD tomorrow if BP will tolerate Anemia ckd and GIB - got Aranesp 100 mcg 11/22/20 with HD. s/p feraheme 510 mg IV on 8/20, 2nd dose due today 8/23, in setting of Tsat 13%. GIB per pmd/ GI.  UGIB: s/p EGD 8/19 with some bleeding gastric ulcers, clipped.  Pt is a [place] witness, refuses transfusion.  Would use caution with carafate in ESRD (can cause aluminum toxicity). Hb down to 6.3 today.  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 HD here Sat and Monday and Wed. Acute HD yest. Short HD Sat then resume MWF.  HTN/ volume: not on any BP meds as OP per chart.   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[facility], cured 2014 per their notes H/o prostate cancer H/o colon cancer  Kelly Splinter, MD 11/29/2020, 12:19 PM     Subjective:   No c/o today   Objective:   BP (!) 109/52   Pulse 79   Temp 98.5 F (36.9 C) (Oral)   Resp 19   Ht 5\' 8"  (1.727 m)   Wt 73.4 kg   SpO2 100%   BMI 24.60 kg/m   Physical Exam: GEN lying in bed, no distress HEENT EOMI PERRL NECK no JVD PULM clear  CV RRR ABD soft, mildly distended, improved EXT no LE edema NEURO AAO x 3 nonfocal ACCESS: R IJ [facility]  Labs: BMET Recent Labs  Lab 11/23/20 0107 11/25/20 0059 11/26/20 0017 11/27/20 0025 11/28/20 0549 11/29/20 0141  NA 133* 133* 130* 130* 133* 133*  K 4.1 3.8 4.0 4.2 4.5 4.1  CL 97* 97* 96* 96* 98 97*   CO2 24 24 28 27 25 27   GLUCOSE 86 84 94 100* 137* 82  BUN 46* 84* 33* 45* 29* 23  CREATININE 5.83* 9.61* 5.80* 7.88* 6.42* 5.19*  CALCIUM 8.4* 8.2* 8.3* 8.3* 8.9 8.9    CBC Recent Labs  Lab 11/25/20 1329 11/26/20 0017 11/27/20 0025 11/28/20 0032 11/29/20 0141  WBC 10.8* 11.2* 10.4  --  16.9*  HGB 7.0* 6.7* 6.3* 6.9* 7.0*  HCT 20.9* 20.0* 19.2* 20.3* 21.4*  MCV 93.3 93.5 95.5  --  97.3  PLT 134* 119* 133*  --  145*       Medications:     Chlorhexidine Gluconate Cloth  6 each Topical Q0600   darbepoetin (ARANESP) injection - DIALYSIS  100 mcg Intravenous Q Fri-HD   ferric citrate  420 mg Oral BID WC   folic acid  1 mg Oral Daily   ketorolac  1 drop Left Eye QID   mouth rinse  15 mL Mouth Rinse BID   multivitamin  1 tablet Oral Daily   ofloxacin  1 drop Left Eye QID   pantoprazole  40 mg Oral BID   polyethylene glycol  17 g Oral Daily   prednisoLONE acetate  1  drop Left Eye QID   sodium chloride flush  3 mL Intravenous Q12H   torsemide  10 mg Oral Daily

## 2020-11-29 NOTE — Progress Notes (Signed)
Progress Note  Patient Name: [name] Colon Date of Encounter: 11/29/2020  [facility] Cardiologist: Candee Furbish, MD   Subjective   Denies any chest pain and breathing improved with volume removal.  Inpatient Medications    Scheduled Meds:  Chlorhexidine Gluconate Cloth  6 each Topical Q0600   darbepoetin (ARANESP) injection - DIALYSIS  100 mcg Intravenous Q Fri-HD   ferric citrate  420 mg Oral BID WC   ketorolac  1 drop Left Eye QID   mouth rinse  15 mL Mouth Rinse BID   multivitamin  1 tablet Oral Daily   ofloxacin  1 drop Left Eye QID   pantoprazole  40 mg Oral BID   polyethylene glycol  17 g Oral Daily   prednisoLONE acetate  1 drop Left Eye QID   sodium chloride flush  3 mL Intravenous Q12H   torsemide  10 mg Oral Daily   Continuous Infusions:   PRN Meds: acetaminophen **OR** acetaminophen, calcium carbonate (dosed in mg elemental calcium), camphor-menthol **AND** hydrOXYzine, docusate sodium, feeding supplement (NEPRO CARB STEADY), fentaNYL (SUBLIMAZE) injection, lidocaine (PF), lidocaine-prilocaine, nitroGLYCERIN, ondansetron **OR** ondansetron (ZOFRAN) IV, pentafluoroprop-tetrafluoroeth, sorbitol, zolpidem   Vital Signs    Vitals:   11/29/20 0300 11/29/20 0341 11/29/20 0400 11/29/20 0700  BP: (!) 108/50 (!) 114/55 (!) 99/45   Pulse:  79    Resp: 18 15 15    Temp:  98.7 F (37.1 C)    TempSrc:  Oral    SpO2: 100% 100% 100%   Weight:    73.4 kg  Height:        Intake/Output Summary (Last 24 hours) at 11/29/2020 0803 Last data filed at 11/28/2020 2300 Gross per 24 hour  Intake 103 ml  Output 2114 ml  Net -2011 ml    Last 3 Weights 11/29/2020 11/28/2020 11/28/2020  Weight (lbs) 161 lb 13.1 oz 158 lb 15.2 oz 164 lb 0.4 oz  Weight (kg) 73.4 kg 72.1 kg 74.4 kg      Telemetry    NSR  Personally Reviewed  ECG    No new EKG to review- Personally Reviewed  Physical Exam   GEN: ill appearing in NAD HEENT: Normal NECK: No JVD; No carotid  bruits LYMPHATICS: No lymphadenopathy CARDIAC:RRR, no murmurs, rubs, gallops RESPIRATORY:  Clear to auscultation without rales, wheezing or rhonchi  ABDOMEN: Soft, non-tender, non-distended MUSCULOSKELETAL:  No edema; No deformity  SKIN: Warm and dry NEUROLOGIC:  Alert and oriented x 3 PSYCHIATRIC:  Normal affect   Labs    High Sensitivity Troponin:   Recent Labs  Lab 11/18/20 1520 11/19/20 2341 11/20/20 0155 11/28/20 0549 11/28/20 0808  TROPONINIHS 45* 84* 102* 696* 1,191*       Chemistry Recent Labs  Lab 11/27/20 0025 11/28/20 0549 11/29/20 0141  NA 130* 133* 133*  K 4.2 4.5 4.1  CL 96* 98 97*  CO2 27 25 27   GLUCOSE 100* 137* 82  BUN 45* 29* 23  CREATININE 7.88* 6.42* 5.19*  CALCIUM 8.3* 8.9 8.9  PROT  --  6.5  --   ALBUMIN  --  2.6*  --   AST  --  44*  --   ALT  --  35  --   ALKPHOS  --  142*  --   BILITOT  --  0.6  --   GFRNONAA 6* 8* 10*  ANIONGAP 7 10 9       Hematology Recent Labs  Lab 11/26/20 0017 11/27/20 0025 11/28/20 0032 11/29/20 0141  WBC 11.2*  10.4  --  16.9*  RBC 2.14* 2.01*  --  2.20*  HGB 6.7* 6.3* 6.9* 7.0*  HCT 20.0* 19.2* 20.3* 21.4*  MCV 93.5 95.5  --  97.3  MCH 31.3 31.3  --  31.8  MCHC 33.5 32.8  --  32.7  RDW 15.7* 16.2*  --  19.1*  PLT 119* 133*  --  145*     BNP Recent Labs  Lab 11/28/20 0549  BNP 2,018.3*      DDimer No results for input(s): DDIMER in the last 168 hours.   Radiology    DG Chest Port 1 View  Result Date: 11/28/2020 CLINICAL DATA:  80 year old male with epigastric pain, shortness of breath and respiratory distress. Dialysis patient. EXAM: PORTABLE CHEST 1 VIEW COMPARISON:  Portable chest 11/19/2020 and earlier. FINDINGS: Portable AP semi upright view at 0618 hours. Larger lung volumes. Mediastinal contours are within normal limits. Stable dual lumen right chest dialysis type catheter. Improved bibasilar ventilation. Pulmonary vascularity mildly increased compared to 11/18/2020. Residual streaky  opacity at the left lung base. No pneumothorax or definite pleural effusion. Visualized tracheal air column is within normal limits. No acute osseous abnormality identified. Small surgical clips in the epigastrium. Negative visible bowel gas. IMPRESSION: Improved lung volumes and bibasilar ventilation with residual streaky lung base opacity more resembling atelectasis than infection. Continued pulmonary vascular congestion with borderline to mild pulmonary edema now. Electronically Signed   By: Genevie Ann M.D.   On: 11/28/2020 06:37   DG Abd Portable 1V  Result Date: 11/28/2020 CLINICAL DATA:  80 year old male with epigastric pain, shortness of breath and respiratory distress. Dialysis patient. EXAM: PORTABLE ABDOMEN - 1 VIEW COMPARISON:  No prior abdominal radiographs. FINDINGS: Portable AP view at 0616 hours. 3 surgical clips are hemostasis clips project over the proximal stomach. Tiny surgical clip right lower quadrant. Non obstructed bowel gas pattern. Intermittent retained stool in the large bowel, with redundant sigmoid versus transverse colon suspected in the left lower quadrant. Other abdominal visceral contours appear normal. No acute osseous abnormality identified. Lumbar spine disc and endplate degeneration. IMPRESSION: 1. Non obstructed bowel gas pattern with intermittent retained stool in the colon. 2. Hemostasis or surgical clips project over the stomach. Electronically Signed   By: Genevie Ann M.D.   On: 11/28/2020 06:38    Cardiac Studies   Echocardiogram this admission  1. Endocardial border tracking is poor resulting in inaccurate automated  LVEF assessment. Mild, global hypokinesis. Left ventricular ejection  fraction, by estimation, is 45 to 50%. The left ventricle has mildly  decreased function. The left ventricle  demonstrates global hypokinesis. Left ventricular diastolic parameters are  consistent with Grade I diastolic dysfunction (impaired relaxation).  Elevated left ventricular  end-diastolic pressure.   2. Right ventricular systolic function is normal. The right ventricular  size is normal. There is moderately elevated pulmonary artery systolic  pressure.   3. Left atrial size was moderately dilated.   4. The mitral valve is normal in structure. Mild mitral valve  regurgitation. No evidence of mitral stenosis.   5. The aortic valve is tricuspid. There is mild calcification of the  aortic valve. There is mild thickening of the aortic valve. Aortic valve  regurgitation is mild. No aortic stenosis is present.   6. Aortic dilatation noted. There is borderline dilatation of the aortic  root, measuring 36 mm. There is mild dilatation of the ascending aorta,  measuring 37 mm.   7. The inferior vena cava is normal in size  with greater than 50%  respiratory variability, suggesting right atrial pressure of 3 mmHg.  Patient Profile     80 y.o. male with what appears to be vasovagal syncope during hemodialysis with mildly reduced ejection fraction 45 to 50%  Assessment & Plan    Vasovagal syncope - This was accompanied by burning indigestion-like discomfort, nausea, hematemesis.   --had 2 episodes of unresponsiveness during a bout of nausea vomiting hematemesis during hemodialysis with bradycardia noted into the 30s on telemetry consistent with vasovagal syncope.  He has classic prodrome of feeling poor, sweating, nauseous. --no further episodes of dizziness or syncope and c/w vasovagal syncope --no further cardiac workup at this time --Try to avoid offending vagal stimuli, avoid vomiting/hematemesis. --No indication for pacemaker.  Chest pain /Elevated troponin/Flash pulmonary edema -- EGD showed ulcerated lesions at the GEJ secondary to [employer] tear treated with hemoclips -- had recurrent abdominal pain last night radiating into his chest that was burning similar to initial presentation --patient had chest pain yesterday with Nausea and SOB and got emergent  HD --Hbg dropped yesterday to 6.3 but up to 7 today --patient is Jehovah's witness and refuses blood --very limited options at this time for assessment of elevated trop and EKG changes.  His Hbg is very low and with recent GIB he is likely not a candidate for DAPT if he is found to have CAD -- I have talked with interventional colleagues regarding cath but given low Hbg and refusal for blood products he is currently deemed not to be a cath candidate --if we can get his Hbg above 8 then could proceed with diagnostic cath but then if found to have CAD there is still the issue of whether it is safe to place on DAPT in setting of severe anemia -BP has been soft and will not tolerate addition of GDMT for angina with BB or nitrates -At this time recommend Palliative Care Consult for goals of care  Chronic systolic heart failure - Mildly reduced ejection fraction 45 to 50% -- initial mild flat hsTrop elevation 44>45>84>102  felt likely demand ischemia in the setting of GI bleed, N/V but had recurrent sx and now hsTrop [phone] -- suspect that elevated hsTrop still due to demand ischemia in the setting of severe anemia -- no focal wall motion abnormality on echo with mild diffuse HK recently  - Not a candidate for ACE inhibitor or Entresto spironolactone Jardiance given his end-stage renal disease status. - We are also holding off of AV nodal blocking agents such as metoprolol given his prior vasovagal syncope.  Anemia of renal disease - Hemoglobin 7.3 a few days ago and dropped to 6.3 yesterday and is now 7 -- He is Jehovah's Witness. 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 --got HD yesterday but complicated by hypotension>>renal feels that acute respiratory failure likely related to another cause other than volume overload such as coronary ischemia or aspiration pneumonitis.  I agree that likely related to acute coronary ischemia but  options for treatment are limited  I have spent a total of 35 minutes with patient reviewing hospital notes , telemetry, EKGs, labs and examining patient as well as establishing an assessment and plan that was discussed with the patient.  > 50% of time was spent in direct patient care.     For questions or updates, please contact [place] Please consult [email or web address] for contact info under  Signed, Fransico Him, MD  11/29/2020, 8:03 AM

## 2020-11-29 NOTE — Progress Notes (Signed)
Physical Therapy Treatment Patient Details Name: [name] Colon MRN: [id number] DOB: 17-Jun-1940 Today's Date: 11/29/2020    History of Present Illness 80 y.o. man who came to the ED on 11/18/20 with chest pain, but tests were negative. He returned on 11/19/20 with CP, SOB requring bipap and nitroglycerin after missed HD session. Urgent HD with CPR during HD and vomiting blood, 1 min CPR with ROSC. PMHx: colon cancer, ESRD, hep C, cirrhosis, HTN.    PT Comments    Pt tolerated treatment with increased ambulation distance compared to previous session. Pt desat on 3-4L of O2 during session; however, readings were inaccurate after trying 3 probes. After session, pt sats were 100% on 2L. Continue to recommend HHPT, as pt moves well.   Pre-vitals - HR 81, BP 115/56(73), spO2 99% on 3.5L Post-vitals - HR 79, BP 116/50(70), spO2 100% on 2L   Follow Up Recommendations  Home health PT;Supervision for mobility/OOB     Equipment Recommendations  None recommended by PT    Recommendations for Other Services       Precautions / Restrictions Precautions Precautions: Fall Restrictions Weight Bearing Restrictions: No    Mobility  Bed Mobility Overal bed mobility: Modified Independent Bed Mobility: Supine to Sit     Supine to sit: Modified independent (Device/Increase time);HOB elevated     General bed mobility comments: Mod I with HOB elevated to 45 degrees    Transfers Overall transfer level: Needs assistance Equipment used: Rolling walker (2 wheeled) Transfers: Sit to/from Stand Sit to Stand: Min guard         General transfer comment: Sit to stand x2: 1 from chair, 1 from recliner. Min guard for safety. Increased time. Demonstrated good hand placement for transfers.  Ambulation/Gait Ambulation/Gait assistance: Min guard Gait Distance (Feet): 100 Feet Assistive device: Rolling walker (2 wheeled) Gait Pattern/deviations: Step-to pattern;Decreased stride length;Decreased  dorsiflexion - right Gait velocity: Decreased Gait velocity interpretation: 1.31 - 2.62 ft/sec, indicative of limited community ambulator General Gait Details: Ambulated 57' then an additional 100' with seated rest in between.   Stairs             Wheelchair Mobility    Modified Rankin (Stroke Patients Only)       Balance Overall balance assessment: Needs assistance Sitting-balance support: Feet supported;No upper extremity supported Sitting balance-Leahy Scale: Good     Standing balance support: Bilateral upper extremity supported;During functional activity Standing balance-Leahy Scale: Fair Standing balance comment: Relies on BUE support when ambulating                            Cognition Arousal/Alertness: Awake/alert Behavior During Therapy: WFL for tasks assessed/performed Overall Cognitive Status: Within Functional Limits for tasks assessed                                        Exercises General Exercises - Lower Extremity Ankle Circles/Pumps: AROM;Both;20 reps;Seated Long Arc Quad: AROM;Both;20 reps;Seated Hip Flexion/Marching: AROM;Both;20 reps;Seated    General Comments General comments (skin integrity, edema, etc.): Pt on 3.5L upon entry. Decreased O2 to 2L, as pt's sats were 99%, and desat to 85% on 2L. Ambulated on 3-4L with readings from 85-87%; however readings were inaccurate, as 3 probes were tried and once seated following session pt was at 100% on 2L. No acute distress present      Pertinent Vitals/Pain  Pain Assessment: No/denies pain    Home Living                      Prior Function            PT Goals (current goals can now be found in the care plan section) Progress towards PT goals: Progressing toward goals    Frequency    Min 3X/week      PT Plan Current plan remains appropriate    Co-evaluation              AM-PAC PT "6 Clicks" Mobility   Outcome Measure  Help needed  turning from your back to your side while in a flat bed without using bedrails?: None Help needed moving from lying on your back to sitting on the side of a flat bed without using bedrails?: None Help needed moving to and from a bed to a chair (including a wheelchair)?: A Little Help needed standing up from a chair using your arms (e.g., wheelchair or bedside chair)?: A Little Help needed to walk in hospital room?: A Little Help needed climbing 3-5 steps with a railing? : A Little 6 Click Score: 20    End of Session Equipment Utilized During Treatment: Gait belt;Oxygen Activity Tolerance: Patient tolerated treatment well Patient left: in chair;with call bell/phone within reach;with chair alarm set Nurse Communication: Mobility status PT Visit Diagnosis: Other abnormalities of gait and mobility (R26.89)     Time: 0086-7619 PT Time Calculation (min) (ACUTE ONLY): 30 min  Charges:  $Gait Training: 8-22 mins $Therapeutic Exercise: 8-22 mins                     Louie Casa, SPT Acute Rehab: [phone]    Domingo Dimes 11/29/2020, 1:51 PM

## 2020-11-29 NOTE — Progress Notes (Addendum)
PROGRESS NOTE    [name] Colon  EXH:[id number] DOB: 1940-05-23 DOA: 11/19/2020 PCP: Leonel Ramsay, MD   Brief Narrative:   Patient is 80 years old male with history of colon cancer, end-stage renal disease on hemodialysis, hepatitis C cirrhosis of liver, hypertension presented to hospital with chest pain and had missed hemodialysis.  He also had worsening shortness of breath and initially required BiPAP and nitro drip.   During hospitalization patient had significant episode of bradycardia and required CPR.  Cardiology was consulted and recommended outpatient follow-up.  2D echocardiogram showed reduced LV function.  GI and nephrology followed the patient during hospitalization.  Patient underwent endoscopic evaluation with Hemoclip placement.  Patient is Jehovah's Witness and has been treated with IV iron and EPO.  During hospitalization, on 11/28/2020 patient had a rapid response called in for congestion, chest pain dyspnea.  Patient was put on BiPAP and cardiology was consulted.  Assessment & Plan:  Chest pain, congestion, tachycardia, dyspnea, elevated troponins with EKG changes with respiratory distress.  Likely secondary to Non-ST elevation MI with pulmonary edema.  Patient had rapid response done on 11/28/2020.  Cardiology was consulted.  At this time patient will need to be optimized on hemoglobin prior to any cardiac catheterization.  Possibility of non-ST elevation MI.  Breathing has improved after BiPAP placement and hemodialysis yesterday.  Recent 2D echocardiogram with LV function of 45 to 50% with left ventricular hypokinesis.    Volume overload On presentation.  Secondary to missed hemodialysis.  Nephrology on board.  Continue hemodialysis as oer nephrology.  Patient received hemodialysis yesterday.  Currently off BiPAP.  Vasovagal syncope  2D echocardiogram with mildly reduced LV function.  Cardiology on board.  Acute blood loss anemia with underlying anemia of chronic  disease Upper GI bleed, gastroesophageal junction ulcers GI on board and underwent endoscopy with Hemoclip placement x3.  Continue Protonix twice daily.  Tolerating p.o. diet.  Patient is Jehovah's Witness and has refused blood transfusion.  Hemoglobin of 7.0 at this time.  Continue Aranesp with hemodialysis and IV iron.  Spoke with hematology yesterday.  Patient is already getting IV iron and Aranesp  ESRD on hemodialysis Nephrology on board for hemodialysis.  Hep C, cirrhosis Compensated at this time.  Debility, deconditioning with orthostatic hypotension seen by physical therapy.    DVT prophylaxis:  SCDs  Code Status:  Full code  Family Communication:   I spoke with the patient's daughter today and updated her about the patient.  Disposition Plan:  Status is: Inpatient  Remains inpatient appropriate because:Inpatient level of care appropriate due to severity of illness  Dispo: The patient is from: Home              Anticipated d/c is to: Home with home health               Patient currently is not medically stable to d/c.   Difficult to place patient No  Consultants:  Cardiology Nephrology GI  Procedures:  EGD 11/22/20 ulcerated lesions at [employer], treated with hemoclips X3 Hemodialysis BiPAP placement  Antimicrobials:  None  Subjective: Patient was seen and examined at bedside.  Feels much better with breathing.  Denies any chest pain, dizziness, lightheadedness.  Objective:  Vitals:   11/29/20 0400 11/29/20 0700 11/29/20 0827 11/29/20 0900  BP: (!) 99/45  (!) 104/51 (!) 109/52  Pulse:      Resp: 15  (!) 23 19  Temp:   98.5 F (36.9 C)   TempSrc:  Oral   SpO2: 100%     Weight:  73.4 kg    Height:        Intake/Output Summary (Last 24 hours) at 11/29/2020 1051 Last data filed at 11/28/2020 2300 Gross per 24 hour  Intake 103 ml  Output 2114 ml  Net -2011 ml    Filed Weights   11/28/20 1240 11/28/20 1600 11/29/20 0700  Weight: 74.4 kg 72.1  kg 73.4 kg    Physical examination: Elderly male, not in obvious distress, alert awake and communicative on nasal cannula oxygen  HENT: Mild pallor noted.  Oral mucosa is moist.  Chest: Minutes breath sounds bilaterally.  Coarse breath sounds. CVS: S1 &S2 heard. No murmur.  Regular rate and rhythm. Abdomen: Soft, nontender, nondistended.  Bowel sounds are heard.   Extremities: No cyanosis, clubbing or edema.  Peripheral pulses are palpable. Psych: Alert, awake and communicative, normal mood CNS:  No cranial nerve deficits.  Moving all extremities Skin: Warm and dry.  No rashes noted.  Data Reviewed:  Following data were reviewed.  CBC: Recent Labs  Lab 11/25/20 0059 11/25/20 1329 11/26/20 0017 11/27/20 0025 11/28/20 0032 11/29/20 0141  WBC 10.9* 10.8* 11.2* 10.4  --  16.9*  HGB 6.4* 7.0* 6.7* 6.3* 6.9* 7.0*  HCT 18.8* 20.9* 20.0* 19.2* 20.3* 21.4*  MCV 91.7 93.3 93.5 95.5  --  97.3  PLT 129* 134* 119* 133*  --  145*    Basic Metabolic Panel: Recent Labs  Lab 11/25/20 0059 11/26/20 0017 11/27/20 0025 11/28/20 0549 11/29/20 0141  NA 133* 130* 130* 133* 133*  K 3.8 4.0 4.2 4.5 4.1  CL 97* 96* 96* 98 97*  CO2 24 28 27 25 27   GLUCOSE 84 94 100* 137* 82  BUN 84* 33* 45* 29* 23  CREATININE 9.61* 5.80* 7.88* 6.42* 5.19*  CALCIUM 8.2* 8.3* 8.3* 8.9 8.9  MG  --   --   --   --  2.0    GFR: Estimated Creatinine Clearance: 10.6 mL/min (A) (by C-G formula based on SCr of 5.19 mg/dL (H)). Liver Function Tests: Recent Labs  Lab 11/28/20 0549  AST 44*  ALT 35  ALKPHOS 142*  BILITOT 0.6  PROT 6.5  ALBUMIN 2.6*    Recent Labs  Lab 11/28/20 0549  LIPASE 72*    No results for input(s): AMMONIA in the last 168 hours. Coagulation Profile: No results for input(s): INR, PROTIME in the last 168 hours. Cardiac Enzymes: No results for input(s): CKTOTAL, CKMB, CKMBINDEX, TROPONINI in the last 168 hours. BNP (last 3 results) No results for input(s): PROBNP in the last  8760 hours. HbA1C: No results for input(s): HGBA1C in the last 72 hours. CBG: Recent Labs  Lab 11/28/20 0519  GLUCAP 116*    Lipid Profile: No results for input(s): CHOL, HDL, LDLCALC, TRIG, CHOLHDL, LDLDIRECT in the last 72 hours. Thyroid Function Tests: No results for input(s): TSH, T4TOTAL, FREET4, T3FREE, THYROIDAB in the last 72 hours. Anemia Panel: No results for input(s): VITAMINB12, FOLATE, FERRITIN, TIBC, IRON, RETICCTPCT in the last 72 hours.  Urine analysis: No results found for: COLORURINE, APPEARANCEUR, LABSPEC, PHURINE, GLUCOSEU, HGBUR, BILIRUBINUR, KETONESUR, PROTEINUR, UROBILINOGEN, NITRITE, LEUKOCYTESUR Sepsis Labs: @LABRCNTIP (procalcitonin:4,lacticidven:4)  ) Recent Results (from the past 240 hour(s))  Resp Panel by RT-PCR (Flu A&B, Covid) Nasopharyngeal Swab     Status: None   Collection Time: 11/20/20 12:02 AM   Specimen: Nasopharyngeal Swab; Nasopharyngeal(NP) swabs in vial transport medium  Result Value Ref Range Status   SARS  Coronavirus 2 by RT PCR NEGATIVE NEGATIVE Final    Comment: (NOTE) SARS-CoV-2 target nucleic acids are NOT DETECTED.  The SARS-CoV-2 RNA is generally detectable in upper respiratory specimens during the acute phase of infection. The lowest concentration of SARS-CoV-2 viral copies this assay can detect is 138 copies/mL. A negative result does not preclude SARS-Cov-2 infection and should not be used as the sole basis for treatment or other patient management decisions. A negative result may occur with  improper specimen collection/handling, submission of specimen other than nasopharyngeal swab, presence of viral mutation(s) within the areas targeted by this assay, and inadequate number of viral copies(<138 copies/mL). A negative result must be combined with clinical observations, patient history, and epidemiological information. The expected result is Negative.  Fact Sheet for Patients:   EntrepreneurPulse.com.au  Fact Sheet for Healthcare Providers:  IncredibleEmployment.be  This test is no t yet approved or cleared by the [place] FDA and  has been authorized for detection and/or diagnosis of SARS-CoV-2 by FDA under an Emergency Use Authorization (EUA). This EUA will remain  in effect (meaning this test can be used) for the duration of the COVID-19 declaration under Section 564(b)(1) of the Act, 21 U.S.C.section 360bbb-3(b)(1), unless the authorization is terminated  or revoked sooner.       Influenza A by PCR NEGATIVE NEGATIVE Final   Influenza B by PCR NEGATIVE NEGATIVE Final    Comment: (NOTE) The Xpert Xpress SARS-CoV-2/FLU/RSV plus assay is intended as an aid in the diagnosis of influenza from Nasopharyngeal swab specimens and should not be used as a sole basis for treatment. Nasal washings and aspirates are unacceptable for Xpert Xpress SARS-CoV-2/FLU/RSV testing.  Fact Sheet for Patients: EntrepreneurPulse.com.au  Fact Sheet for Healthcare Providers: IncredibleEmployment.be  This test is not yet approved or cleared by the [place] FDA and has been authorized for detection and/or diagnosis of SARS-CoV-2 by FDA under an Emergency Use Authorization (EUA). This EUA will remain in effect (meaning this test can be used) for the duration of the COVID-19 declaration under Section 564(b)(1) of the Act, 21 U.S.C. section 360bbb-3(b)(1), unless the authorization is terminated or revoked.  Performed at [facility], [place] [address], [place], [place] [address]   MRSA Next Gen by PCR, Nasal     Status: None   Collection Time: 11/20/20 10:18 AM   Specimen: Nasal Mucosa; Nasal Swab  Result Value Ref Range Status   MRSA by PCR Next Gen NOT DETECTED NOT DETECTED Final    Comment: (NOTE) The GeneXpert MRSA Assay (FDA approved for NASAL specimens only), is one component of a  comprehensive MRSA colonization surveillance program. It is not intended to diagnose MRSA infection nor to guide or monitor treatment for MRSA infections. Test performance is not FDA approved in patients less than 60 years old. Performed at [facility], [place] [address], [place], [place] [address]       Scheduled Meds:  Chlorhexidine Gluconate Cloth  6 each Topical Q0600   darbepoetin (ARANESP) injection - DIALYSIS  100 mcg Intravenous Q Fri-HD   ferric citrate  420 mg Oral BID WC   ketorolac  1 drop Left Eye QID   mouth rinse  15 mL Mouth Rinse BID   multivitamin  1 tablet Oral Daily   ofloxacin  1 drop Left Eye QID   pantoprazole  40 mg Oral BID   polyethylene glycol  17 g Oral Daily   prednisoLONE acetate  1 drop Left Eye QID  sodium chloride flush  3 mL Intravenous Q12H   torsemide  10 mg Oral Daily   Continuous Infusions:    LOS: 9 days   Flora Lipps, MD [facility] 11/29/2020, 10:51 AM

## 2020-11-30 DIAGNOSIS — Z7189 Other specified counseling: Secondary | ICD-10-CM | POA: Diagnosis not present

## 2020-11-30 DIAGNOSIS — B182 Chronic viral hepatitis C: Secondary | ICD-10-CM

## 2020-11-30 DIAGNOSIS — J810 Acute pulmonary edema: Secondary | ICD-10-CM | POA: Diagnosis not present

## 2020-11-30 DIAGNOSIS — E8770 Fluid overload, unspecified: Secondary | ICD-10-CM | POA: Diagnosis not present

## 2020-11-30 DIAGNOSIS — K7460 Unspecified cirrhosis of liver: Secondary | ICD-10-CM

## 2020-11-30 DIAGNOSIS — Z515 Encounter for palliative care: Secondary | ICD-10-CM | POA: Diagnosis not present

## 2020-11-30 DIAGNOSIS — I5043 Acute on chronic combined systolic (congestive) and diastolic (congestive) heart failure: Secondary | ICD-10-CM | POA: Diagnosis not present

## 2020-11-30 DIAGNOSIS — I951 Orthostatic hypotension: Secondary | ICD-10-CM

## 2020-11-30 LAB — CBC
HCT: 21.2 % — ABNORMAL LOW (ref 39.0–52.0)
Hemoglobin: 7.1 g/dL — ABNORMAL LOW (ref 13.0–17.0)
MCH: 32.4 pg (ref 26.0–34.0)
MCHC: 33.5 g/dL (ref 30.0–36.0)
MCV: 96.8 fL (ref 80.0–100.0)
Platelets: 122 10*3/uL — ABNORMAL LOW (ref 150–400)
RBC: 2.19 MIL/uL — ABNORMAL LOW (ref 4.22–5.81)
RDW: 19 % — ABNORMAL HIGH (ref 11.5–15.5)
WBC: 15 10*3/uL — ABNORMAL HIGH (ref 4.0–10.5)
nRBC: 0.1 % (ref 0.0–0.2)

## 2020-11-30 MED ORDER — DARBEPOETIN ALFA 200 MCG/0.4ML IJ SOSY: 200.0000 ug | PREFILLED_SYRINGE | Freq: Once | INTRAMUSCULAR | Status: AC

## 2020-11-30 NOTE — Progress Notes (Signed)
Progress Note  Patient Name: [name] Colon Date of Encounter: 11/30/2020  [facility] Cardiologist: Candee Furbish, MD   Subjective   Breathing continues to improved.  NO Chest pain.   Inpatient Medications    Scheduled Meds:  Chlorhexidine Gluconate Cloth  6 each Topical Q0600   darbepoetin (ARANESP) injection - DIALYSIS  100 mcg Intravenous Q Fri-HD   ferric citrate  420 mg Oral BID WC   folic acid  1 mg Oral Daily   ketorolac  1 drop Left Eye QID   mouth rinse  15 mL Mouth Rinse BID   multivitamin  1 tablet Oral Daily   ofloxacin  1 drop Left Eye QID   pantoprazole  40 mg Oral BID   polyethylene glycol  17 g Oral Daily   prednisoLONE acetate  1 drop Left Eye QID   sodium chloride flush  3 mL Intravenous Q12H   torsemide  10 mg Oral Daily   Continuous Infusions:   PRN Meds: acetaminophen **OR** acetaminophen, calcium carbonate (dosed in mg elemental calcium), camphor-menthol **AND** hydrOXYzine, docusate sodium, feeding supplement (NEPRO CARB STEADY), fentaNYL (SUBLIMAZE) injection, lidocaine (PF), lidocaine-prilocaine, nitroGLYCERIN, ondansetron **OR** ondansetron (ZOFRAN) IV, pentafluoroprop-tetrafluoroeth, sorbitol, zolpidem   Vital Signs    Vitals:   11/29/20 2355 11/30/20 0000 11/30/20 0358 11/30/20 0955  BP: (!) 111/54 (!) 116/56 (!) 115/54 111/65  Pulse: 81  82   Resp: 16 17 (!) 21 17  Temp: 98.5 F (36.9 C)  98.3 F (36.8 C) 98 F (36.7 C)  TempSrc: Oral  Oral Oral  SpO2: 100%  100% 99%  Weight:      Height:        Intake/Output Summary (Last 24 hours) at 11/30/2020 1007 Last data filed at 11/30/2020 0000 Gross per 24 hour  Intake 243 ml  Output 200 ml  Net 43 ml    Last 3 Weights 11/29/2020 11/28/2020 11/28/2020  Weight (lbs) 161 lb 13.1 oz 158 lb 15.2 oz 164 lb 0.4 oz  Weight (kg) 73.4 kg 72.1 kg 74.4 kg      Telemetry    NSR  Personally Reviewed  ECG    No new EKG to review- Personally Reviewed  Physical Exam   GEN: Elderly ill  appearing in NAD HEENT: Normal NECK: No JVD; No carotid bruits LYMPHATICS: No lymphadenopathy CARDIAC:RRR, no murmurs, rubs, gallops RESPIRATORY:  Clear to auscultation without rales, wheezing or rhonchi  ABDOMEN: Soft, non-tender, non-distended MUSCULOSKELETAL:  No edema; No deformity  SKIN: Warm and dry NEUROLOGIC:  Alert and oriented x 3 PSYCHIATRIC:  Normal affect   Labs    High Sensitivity Troponin:   Recent Labs  Lab 11/18/20 1520 11/19/20 2341 11/20/20 0155 11/28/20 0549 11/28/20 0808  TROPONINIHS 45* 84* 102* 696* 1,191*       Chemistry Recent Labs  Lab 11/27/20 0025 11/28/20 0549 11/29/20 0141  NA 130* 133* 133*  K 4.2 4.5 4.1  CL 96* 98 97*  CO2 27 25 27   GLUCOSE 100* 137* 82  BUN 45* 29* 23  CREATININE 7.88* 6.42* 5.19*  CALCIUM 8.3* 8.9 8.9  PROT  --  6.5  --   ALBUMIN  --  2.6*  --   AST  --  44*  --   ALT  --  35  --   ALKPHOS  --  142*  --   BILITOT  --  0.6  --   GFRNONAA 6* 8* 10*  ANIONGAP 7 10 9       Hematology  Recent Labs  Lab 11/27/20 0025 11/28/20 0032 11/29/20 0141 11/30/20 0057  WBC 10.4  --  16.9* 15.0*  RBC 2.01*  --  2.20* 2.19*  HGB 6.3* 6.9* 7.0* 7.1*  HCT 19.2* 20.3* 21.4* 21.2*  MCV 95.5  --  97.3 96.8  MCH 31.3  --  31.8 32.4  MCHC 32.8  --  32.7 33.5  RDW 16.2*  --  19.1* 19.0*  PLT 133*  --  145* 122*     BNP Recent Labs  Lab 11/28/20 0549  BNP 2,018.3*      DDimer No results for input(s): DDIMER in the last 168 hours.   Radiology    No results found.  Cardiac Studies   Echocardiogram this admission  1. Endocardial border tracking is poor resulting in inaccurate automated  LVEF assessment. Mild, global hypokinesis. Left ventricular ejection  fraction, by estimation, is 45 to 50%. The left ventricle has mildly  decreased function. The left ventricle  demonstrates global hypokinesis. Left ventricular diastolic parameters are  consistent with Grade I diastolic dysfunction (impaired relaxation).   Elevated left ventricular end-diastolic pressure.   2. Right ventricular systolic function is normal. The right ventricular  size is normal. There is moderately elevated pulmonary artery systolic  pressure.   3. Left atrial size was moderately dilated.   4. The mitral valve is normal in structure. Mild mitral valve  regurgitation. No evidence of mitral stenosis.   5. The aortic valve is tricuspid. There is mild calcification of the  aortic valve. There is mild thickening of the aortic valve. Aortic valve  regurgitation is mild. No aortic stenosis is present.   6. Aortic dilatation noted. There is borderline dilatation of the aortic  root, measuring 36 mm. There is mild dilatation of the ascending aorta,  measuring 37 mm.   7. The inferior vena cava is normal in size with greater than 50%  respiratory variability, suggesting right atrial pressure of 3 mmHg.  Patient Profile     80 y.o. male with what appears to be vasovagal syncope during hemodialysis with mildly reduced ejection fraction 45 to 50%  Assessment & Plan    Vasovagal syncope - This was accompanied by burning indigestion-like discomfort, nausea, hematemesis.   --had 2 episodes of unresponsiveness during a bout of nausea vomiting hematemesis during hemodialysis with bradycardia noted into the 30s on telemetry consistent with vasovagal syncope.  He has classic prodrome of feeling poor, sweating, nauseous. --no further episodes of dizziness or syncope and c/w vasovagal syncope --no further cardiac workup at this time --Try to avoid offending vagal stimuli, avoid vomiting/hematemesis. --No indication for pacemaker.  Chest pain /Elevated troponin/Flash pulmonary edema -- EGD showed ulcerated lesions at the GEJ secondary to [employer] tear treated with hemoclips -- had recurrent abdominal pain last night radiating into his chest that was burning similar to initial presentation --patient had chest pain yesterday with Nausea  and SOB and got emergent HD --Hbg dropped to 6.3 but up to 7.1 today --patient is Jehovah's witness and refuses blood --very limited options at this time for assessment of elevated trop and EKG changes.  His Hbg is very low and with recent GIB he is likely not a candidate for DAPT if he is found to have CAD -- I have talked with interventional colleagues regarding cath but given low Hbg and refusal for blood products he is currently deemed not to be a cath candidate --if we can get his Hbg above 8 (needs groin access with  higher risk of bleeding) then could proceed with diagnostic cath but then if found to have CAD there is still the issue of whether it is safe to place on DAPT in setting of severe anemia -BP has been soft and have not been able to tolerate addition of GDMT for angina with BB or nitrates -At this time recommend Palliative Care Consult for goals of care  Chronic systolic heart failure - Mildly reduced ejection fraction 45 to 50% -- initial mild flat hsTrop elevation 44>45>84>102  felt likely demand ischemia in the setting of GI bleed, N/V but had recurrent sx and now hsTrop [phone] -- suspect that elevated hsTrop still due to demand ischemia in the setting of severe anemia -- no focal wall motion abnormality on echo with mild diffuse HK recently  - Not a candidate for ACE inhibitor or Entresto spironolactone Jardiance given his end-stage renal disease status. - We are also holding off of AV nodal blocking agents such as metoprolol given his prior vasovagal syncope.  Anemia of renal disease - Hemoglobin 7.3 a few days ago and dropped to 6.3 a few days ago in setting of recurrent CP and is now 7.1 -- He is Jehovah's Witness. 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 --got HD complicated by hypotension>>renal feels that acute respiratory failure likely related to another cause other than volume overload such as coronary  ischemia or aspiration pneumonitis.  I agree that likely related to acute coronary ischemia but options for treatment are limited   For questions or updates, please contact [facility] Please consult [email or web address] for contact info under        Signed, Fransico Him, MD  11/30/2020, 10:07 AM

## 2020-11-30 NOTE — Progress Notes (Signed)
[facility] Progress Note   Assessment/ Plan:    OP HD: [facility] MWF   4h  77.5kg  3/2.0 bath  400/1.5  TDC  Hep 2400 Mircera 50 mcg q 4 weeks, last given 11/13/20 Calcitriol 0.75 mcg q rx Binder- Auryxia 210 mg 1 TID AC  Assessment/ Plan SOB/ respiratory distress/ pulm edema/ vol overload: resolved w/ HD. Then had another episode yest 8/25, responded well to HD w/ 2.0 L UF. Bipap dc'd, on nasal cannula today. 4kg under prior dry wt. Cont to lower vol w/ HD tomorrow if BP will tolerate Anemia ckd and GIB - got Aranesp 100 mcg 11/22/20 with HD, will ^ to max 200 ug darbe w/ HD today. s/p feraheme 510 mg x 2 here for low tsat 13%. Hb up to 7.0 today. GIB per pmd/ GI.  UGIB: s/p EGD 8/19 with some bleeding gastric ulcers, clipped.  Would use caution with carafate in ESRD (can cause aluminum toxicity).  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 HD here Sat and Monday and Wed. Acute HD yest. Short HD today then resume MWF.  HTN/ volume: not on any BP meds. 4-5kg under dry wt, lower further w/ HD today as BP tolerates.  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[facility], cured 2014 per their notes H/o prostate cancer H/o Colon cancer  [name] Splinter, MD 11/30/2020, 11:41 AM     Subjective:   No c/o today   Objective:   BP 111/65 (BP Location: Left Arm)   Pulse 82   Temp 98 F (36.7 C) (Oral)   Resp 17   Ht 5\' 8"  (1.727 m)   Wt 73.1 kg   SpO2 99%   BMI 24.50 kg/m   Physical Exam: GEN lying in bed, no distress HEENT EOMI PERRL NECK no JVD PULM clear  CV RRR ABD soft, mildly distended, improved EXT no LE edema NEURO AAO x 3 nonfocal ACCESSRetta Colon [name] Colon  Labs: BMET Recent Labs  Lab 11/25/20 0059 11/26/20 0017 11/27/20 0025 11/28/20 0549 11/29/20 0141  NA 133* 130* 130* 133* 133*  K 3.8 4.0 4.2 4.5 4.1  CL 97* 96* 96* 98 97*  CO2 24 28  27 25 27   GLUCOSE 84 94 100* 137* 82  BUN 84* 33* 45* 29* 23  CREATININE 9.61* 5.80* 7.88* 6.42* 5.19*  CALCIUM 8.2* 8.3* 8.3* 8.9 8.9    CBC Recent Labs  Lab 11/26/20 0017 11/27/20 0025 11/28/20 0032 11/29/20 0141 11/30/20 0057  WBC 11.2* 10.4  --  16.9* 15.0*  HGB 6.7* 6.3* 6.9* 7.0* 7.1*  HCT 20.0* 19.2* 20.3* 21.4* 21.2*  MCV 93.5 95.5  --  97.3 96.8  PLT 119* 133*  --  145* 122*       Medications:     Chlorhexidine Gluconate Cloth  6 each Topical Q0600   darbepoetin (ARANESP) injection - DIALYSIS  200 mcg Intravenous Once in dialysis   ferric citrate  420 mg Oral BID WC   folic acid  1 mg Oral Daily   ketorolac  1 drop Left Eye QID   mouth rinse  15 mL Mouth Rinse BID   multivitamin  1 tablet Oral Daily   ofloxacin  1 drop Left Eye QID   pantoprazole  40 mg Oral BID   polyethylene glycol  17 g Oral Daily   prednisoLONE acetate  1 drop Left Eye  QID   sodium chloride flush  3 mL Intravenous Q12H   torsemide  10 mg Oral Daily

## 2020-11-30 NOTE — Plan of Care (Signed)
  Problem: Health Behavior/Discharge Planning: Goal: Ability to manage health-related needs will improve Outcome: Progressing   Problem: Clinical Measurements: Goal: Ability to maintain clinical measurements within normal limits will improve Outcome: Progressing Goal: Will remain free from infection Outcome: Progressing Goal: Diagnostic test results will improve Outcome: Progressing Goal: Respiratory complications will improve Outcome: Progressing Goal: Cardiovascular complication will be avoided Outcome: Progressing   Problem: Activity: Goal: Risk for activity intolerance will decrease Outcome: Progressing   Problem: Elimination: Goal: Will not experience complications related to bowel motility Outcome: Progressing   Problem: Pain Managment: Goal: General experience of comfort will improve Outcome: Progressing   Problem: Safety: Goal: Ability to remain free from injury will improve Outcome: Progressing   Problem: Skin Integrity: Goal: Risk for impaired skin integrity will decrease Outcome: Progressing

## 2020-11-30 NOTE — Progress Notes (Signed)
   Palliative Medicine Inpatient Follow Up Note  Consulting Provider: Flora Lipps, MD   Reason for Colon:   [name] Colon  Reason for Colon? Goals of care.    HPI:  Per intake H&P --> Patient is 80 years old male with history of colon cancer, end-stage renal disease on hemodialysis, hepatitis C cirrhosis of liver, hypertension presented to hospital with chest pain and had missed hemodialysis.  He also had worsening shortness of breath and initially required BiPAP and nitro drip.  During hospitalization patient had significant episode of bradycardia and required CPR.  Cardiology was consulted and recommended outpatient follow-up.  2D echocardiogram showed reduced LV function.  GI and nephrology followed the patient during hospitalization.  Patient underwent endoscopic evaluation with Hemoclip placement.  Patient is Jehovah's Witness and has been treated with IV iron and EPO.   Palliative care has been asked to get involved to further address goals of care in the setting of multiple co-morbidities and a declining health state.   Today's Discussion (11/30/2020):  *Please note that this is a verbal dictation therefore any spelling or grammatical errors are due to the "[employer]" system interpretation.  Chart reviewed. Patients Hgb 7.1, WBC downtrending.   I checked in on [name] Colon this morning. He was getting weighed by his nursing technician, he appeared to move well with SBA. He afterwards decided to sit up in the chair. [name] Colon shares with me this morning that he is overall feeling fairly well. He has improvement in his breathing and no longer endorses chest pain which he is thankful for.   [name] Colon shares that he would like some more time to review the MOST form with his daughter and states that he does not feel ready to complete it this morning.   Questions and concerns addressed   Objective Assessment: Vital Signs Vitals:    11/30/20 0358 11/30/20 0955  BP: (!) 115/54 111/65  Pulse: 82   Resp: (!) 21 17  Temp: 98.3 F (36.8 C) 98 F (36.7 C)  SpO2: 100% 99%    Intake/Output Summary (Last 24 hours) at 11/30/2020 1403 Last data filed at 11/30/2020 0000 Gross per 24 hour  Intake 243 ml  Output 200 ml  Net 43 ml   Last Weight  Most recent update: 11/30/2020 10:45 AM    Weight  73.1 kg (161 lb 1.6 oz)            Gen:  Elderly AA M in NAD HEENT: moist mucous membranes CV: Regular rate and rhythm  PULM: On 2LPM [place] ABD: soft/nontender  EXT: No edema  Neuro: Alert and oriented x3   SUMMARY OF RECOMMENDATIONS   Full code / full scope of treatment   MOST form reviewed with family, they will complete prior to DC   Advance Directives complete will be scanned into Vyn[employer]    TOC - OP Palliative support   Ongoing support  Time Spent: 25 Greater than 50% of the time was spent in counseling and coordination of care ______________________________________________________________________________________ [name] Colon Colon Cell Phone: [phone] Please utilize secure chat with additional questions, if there is no response within 30 minutes please call the above phone number  Palliative Medicine Colon providers are available by phone from 7am to 7pm daily and can be reached through the Colon cell phone.  Should this patient require assistance outside of these hours, please call the patient's attending physician.

## 2020-11-30 NOTE — Progress Notes (Signed)
PROGRESS NOTE    [name] Colon  FOY:[id number] DOB: March 14, 1941 DOA: 11/19/2020 PCP: Leonel Ramsay, MD   Brief Narrative:   Patient is 80 years old male with history of colon cancer, end-stage renal disease on hemodialysis, hepatitis C cirrhosis of liver, hypertension presented to hospital with chest pain and had missed hemodialysis.  He also had worsening shortness of breath and initially required BiPAP and nitro drip.   During hospitalization, patient had significant episode of bradycardia and required CPR. 2D echocardiogram showed reduced LV function.  GI and nephrology followed the patient during hospitalization.  Patient underwent endoscopic evaluation with Hemoclip placement.  Patient is Jehovah's Witness and has been treated with IV iron and EPO.  During hospitalization, on 11/28/2020 patient had a rapid response called in for congestion, chest pain dyspnea.  He did have elevated troponins.  Patient was put on BiPAP and cardiology was consulted.  Patient underwent hemodialysis with improvement in his symptoms.  Patient still persist to have significantly low hemoglobin limiting cardiac catheterization.  Assessment & Plan:  Chest pain, congestion, tachycardia, dyspnea, elevated troponins with EKG changes with respiratory distress.  Resolved at this time.  Likely secondary to Non-ST elevation MI with pulmonary edema.  Was on BiPAP briefly.  Recent 2D echocardiogram with LV function of 45 to 50% with left ventricular hypokinesis.  Cardiology recommending cardiac cath once hemoglobin more than 8.  Volume overload On presentation.  Secondary to missed hemodialysis.  Nephrology on board.  Continue hemodialysis as oer nephrology.  On nasal cannula oxygen on 2 L/min  Vasovagal syncope Resolved.  2D echocardiogram with mildly reduced LV function.    Acute blood loss anemia with underlying anemia of chronic disease Upper GI bleed, gastroesophageal junction ulcers GI on board and  underwent endoscopy with Hemoclip placement x3.  Continue Protonix twice daily.  Tolerating p.o. diet.  Patient is Jehovah's Witness.  Has been receiving Aranesp and received Feraheme twice.  Spoke with hematology and nephrology about it..  We will continue iron and Aranesp.  Latest hemoglobin of 7.1  ESRD on hemodialysis Nephrology on board for hemodialysis.  Hep C, cirrhosis Compensated at this time.  Debility, deconditioning with orthostatic hypotension PT evaluation will likely need rehabilitation   DVT prophylaxis:  SCDs  Code Status:  Full code  Family Communication:  None today.  Spoke to daughter 11/29/2020.    Disposition Plan:  Status is: Inpatient  Remains inpatient appropriate because:Inpatient level of care appropriate due to severity of illness  Dispo: The patient is from: Home              Anticipated d/c is to: Home with home health likely.                Patient currently is not medically stable to d/c.   Difficult to place patient No  Consultants:  Cardiology Nephrology GI  Procedures:   EGD 11/22/20 ulcerated lesions at [facility], treated with hemoclips X3 Hemodialysis BiPAP placement  Antimicrobials:  None  Subjective: Today, patient was seen and examined at bedside.  Patient denies any dizziness, lightheadedness, chest pain, shortness of breath.  Still complains of constipation.  Last bowel movement was 4 days back  Objective:  Vitals:   11/29/20 2000 11/29/20 2355 11/30/20 0000 11/30/20 0358  BP: (!) 114/57 (!) 111/54 (!) 116/56 (!) 115/54  Pulse:  81  82  Resp:  16 17 (!) 21  Temp:  98.5 F (36.9 C)  98.3 F (36.8 C)  TempSrc:  Oral  Oral  SpO2:  100%  100%  Weight:      Height:        Intake/Output Summary (Last 24 hours) at 11/30/2020 0740 Last data filed at 11/30/2020 0000 Gross per 24 hour  Intake 243 ml  Output 200 ml  Net 43 ml    Filed Weights   11/28/20 1240 11/28/20 1600 11/29/20 0700  Weight: 74.4 kg 72.1 kg 73.4  kg    Physical examination:  General:  Average built, not in obvious distress, on nasal cannula oxygen, alert awake communicative HENT:   Mild pallor noted oral mucosa is moist.  Chest: .  Diminished breath sounds bilaterally.  CVS: S1 &S2 heard. No murmur.  Regular rate and rhythm. Abdomen: Soft, nontender, nondistended.  Bowel sounds are heard.   Extremities: No cyanosis, clubbing or edema.  Peripheral pulses are palpable. Psych: Alert, awake and communicative, normal mood CNS:  No cranial nerve deficits.  Moves all extremities Skin: Warm and dry.  No rashes noted.   Data Reviewed:  Following data were reviewed.  CBC: Recent Labs  Lab 11/25/20 1329 11/26/20 0017 11/27/20 0025 11/28/20 0032 11/29/20 0141 11/30/20 0057  WBC 10.8* 11.2* 10.4  --  16.9* 15.0*  HGB 7.0* 6.7* 6.3* 6.9* 7.0* 7.1*  HCT 20.9* 20.0* 19.2* 20.3* 21.4* 21.2*  MCV 93.3 93.5 95.5  --  97.3 96.8  PLT 134* 119* 133*  --  145* 122*    Basic Metabolic Panel: Recent Labs  Lab 11/25/20 0059 11/26/20 0017 11/27/20 0025 11/28/20 0549 11/29/20 0141  NA 133* 130* 130* 133* 133*  K 3.8 4.0 4.2 4.5 4.1  CL 97* 96* 96* 98 97*  CO2 24 28 27 25 27   GLUCOSE 84 94 100* 137* 82  BUN 84* 33* 45* 29* 23  CREATININE 9.61* 5.80* 7.88* 6.42* 5.19*  CALCIUM 8.2* 8.3* 8.3* 8.9 8.9  MG  --   --   --   --  2.0    GFR: Estimated Creatinine Clearance: 10.6 mL/min (A) (by C-G formula based on SCr of 5.19 mg/dL (H)). Liver Function Tests: Recent Labs  Lab 11/28/20 0549  AST 44*  ALT 35  ALKPHOS 142*  BILITOT 0.6  PROT 6.5  ALBUMIN 2.6*    Recent Labs  Lab 11/28/20 0549  LIPASE 72*    No results for input(s): AMMONIA in the last 168 hours. Coagulation Profile: No results for input(s): INR, PROTIME in the last 168 hours. Cardiac Enzymes: No results for input(s): CKTOTAL, CKMB, CKMBINDEX, TROPONINI in the last 168 hours. BNP (last 3 results) No results for input(s): PROBNP in the last 8760  hours. HbA1C: No results for input(s): HGBA1C in the last 72 hours. CBG: Recent Labs  Lab 11/28/20 0519  GLUCAP 116*    Lipid Profile: No results for input(s): CHOL, HDL, LDLCALC, TRIG, CHOLHDL, LDLDIRECT in the last 72 hours. Thyroid Function Tests: No results for input(s): TSH, T4TOTAL, FREET4, T3FREE, THYROIDAB in the last 72 hours. Anemia Panel: No results for input(s): VITAMINB12, FOLATE, FERRITIN, TIBC, IRON, RETICCTPCT in the last 72 hours.  Urine analysis: No results found for: COLORURINE, APPEARANCEUR, LABSPEC, PHURINE, GLUCOSEU, HGBUR, BILIRUBINUR, KETONESUR, PROTEINUR, UROBILINOGEN, NITRITE, LEUKOCYTESUR Sepsis Labs: @LABRCNTIP (procalcitonin:4,lacticidven:4)  ) Recent Results (from the past 240 hour(s))  MRSA Next Gen by PCR, Nasal     Status: None   Collection Time: 11/20/20 10:18 AM   Specimen: Nasal Mucosa; Nasal Swab  Result Value Ref Range Status   MRSA by PCR Next Gen NOT DETECTED NOT  DETECTED Final    Comment: (NOTE) The GeneXpert MRSA Assay (FDA approved for NASAL specimens only), is one component of a comprehensive MRSA colonization surveillance program. It is not intended to diagnose MRSA infection nor to guide or monitor treatment for MRSA infections. Test performance is not FDA approved in patients less than 8 years old. Performed at [facility], [place] [address] [place], [place] [address]       Scheduled Meds:  Chlorhexidine Gluconate Cloth  6 each Topical Q0600   darbepoetin (ARANESP) injection - DIALYSIS  100 mcg Intravenous Q Fri-HD   ferric citrate  420 mg Oral BID WC   folic acid  1 mg Oral Daily   ketorolac  1 drop Left Eye QID   mouth rinse  15 mL Mouth Rinse BID   multivitamin  1 tablet Oral Daily   ofloxacin  1 drop Left Eye QID   pantoprazole  40 mg Oral BID   polyethylene glycol  17 g Oral Daily   prednisoLONE acetate  1 drop Left Eye QID   sodium chloride flush  3 mL Intravenous Q12H   torsemide  10 mg Oral Daily    Continuous Infusions:    LOS: 10 days   Flora Lipps, MD [facility] 11/30/2020, 7:40 AM

## 2020-12-01 DIAGNOSIS — I10 Essential (primary) hypertension: Secondary | ICD-10-CM | POA: Diagnosis not present

## 2020-12-01 DIAGNOSIS — N186 End stage renal disease: Secondary | ICD-10-CM | POA: Diagnosis not present

## 2020-12-01 DIAGNOSIS — I5043 Acute on chronic combined systolic (congestive) and diastolic (congestive) heart failure: Secondary | ICD-10-CM | POA: Diagnosis not present

## 2020-12-01 DIAGNOSIS — E8770 Fluid overload, unspecified: Secondary | ICD-10-CM | POA: Diagnosis not present

## 2020-12-01 DIAGNOSIS — J810 Acute pulmonary edema: Secondary | ICD-10-CM | POA: Diagnosis not present

## 2020-12-01 DIAGNOSIS — B182 Chronic viral hepatitis C: Secondary | ICD-10-CM | POA: Diagnosis not present

## 2020-12-01 DIAGNOSIS — I420 Dilated cardiomyopathy: Secondary | ICD-10-CM

## 2020-12-01 LAB — HEMOGLOBIN AND HEMATOCRIT, BLOOD
HCT: 23 % — ABNORMAL LOW (ref 39.0–52.0)
Hemoglobin: 7.4 g/dL — ABNORMAL LOW (ref 13.0–17.0)

## 2020-12-01 MED ORDER — DARBEPOETIN ALFA 200 MCG/0.4ML IJ SOSY
PREFILLED_SYRINGE | INTRAMUSCULAR | Status: AC
  Administered 2020-12-01: 200 ug via INTRAVENOUS
  Filled 2020-12-01: qty 0.4

## 2020-12-01 MED ORDER — HEPARIN SODIUM (PORCINE) 1000 UNIT/ML IJ SOLN
INTRAMUSCULAR | Status: AC
  Administered 2020-12-01: 1000 [IU]
  Filled 2020-12-01: qty 4

## 2020-12-01 NOTE — Progress Notes (Addendum)
PROGRESS NOTE    [name] Colon  ERX:[id number] DOB: 1940/09/28 DOA: 11/19/2020 PCP: Leonel Ramsay, MD   Brief Narrative:   Patient is 80 years old male with history of colon cancer, end-stage renal disease on hemodialysis, hepatitis C cirrhosis of liver, hypertension presented to hospital with chest pain and had missed hemodialysis.  He also had worsening shortness of breath and initially required BiPAP and nitro drip.   During hospitalization, patient had significant episode of bradycardia and required CPR. 2D echocardiogram showed reduced LV function.  GI and nephrology followed the patient during hospitalization.  Patient underwent endoscopic evaluation with Hemoclip placement.  Patient is Jehovah's Witness and has been treated with IV iron and EPO.  During hospitalization, on 11/28/2020 patient had a rapid response called in for congestion, chest pain dyspnea.  He did have elevated troponins.  Patient was put on BiPAP and cardiology was consulted.  Patient underwent hemodialysis with improvement in his symptoms.  Patient still persist to have significantly low hemoglobin limiting cardiac catheterization.  Assessment & Plan:  Chest pain, congestion, tachycardia, dyspnea, elevated troponins with EKG changes with respiratory distress. Resolved at this time.  Likely secondary to Non-ST elevation MI with pulmonary edema.  Was on BiPAP briefly.  Recent 2D echocardiogram with LV function of 45 to 50% with left ventricular hypokinesis.  Cardiology recommending cardiac cath once hemoglobin more than 8.  Constipation.  Continue with bowel regimen.  Volume overload On presentation.  Secondary to missed hemodialysis.  Nephrology on board.  Continue hemodialysis as oer nephrology.  On nasal cannula oxygen on 2 L/min  Vasovagal syncope Resolved.  2D echocardiogram with mildly reduced LV function.    Acute blood loss anemia with underlying anemia of chronic disease Upper GI bleed,  gastroesophageal junction ulcers  Patient underwent endoscopy with Hemoclip placement x3.  Continue Protonix twice daily.  Tolerating p.o. diet.  Patient is Jehovah's Witness.  Has been receiving Aranesp and received Feraheme twice.   continue iron and Aranesp.  Latest hemoglobin of 7.4  ESRD on hemodialysis Nephrology on board for hemodialysis.  Hep C, cirrhosis Compensated at this time.  Debility, deconditioning with orthostatic hypotension [facility] on discharge  DVT prophylaxis:  SCDs  Code Status:  Full code  Family Communication:  None today.  Spoke to daughter 11/29/2020.    Disposition Plan:  Status is: Inpatient  Remains inpatient appropriate because:Inpatient level of care appropriate due to severity of illness  Dispo: The patient is from: Home              Anticipated d/c is to: Home with home health               Patient currently is not medically stable to d/c.   Difficult to place patient No  Consultants:  Cardiology Nephrology GI  Procedures:   EGD 11/22/20 ulcerated lesions at [facility], treated with hemoclips X3 Hemodialysis BiPAP placement  Antimicrobials:  None  Subjective: Today, patient seen and examined at bedside during hemodialysis.  Denies any chest pain, shortness of breath, fever, chills or rigor.  Objective:  Vitals:   12/01/20 0800 12/01/20 0803 12/01/20 0830 12/01/20 0900  BP: 113/83 110/70 (!) 109/56 (!) 110/58  Pulse: 89 88 82 78  Resp: 20 20 18 18   Temp: 98.1 F (36.7 C)     TempSrc: Oral     SpO2: 98% 98% 98% 98%  Weight: 73 kg     Height:        Intake/Output Summary (  Last 24 hours) at 12/01/2020 1029 Last data filed at 12/01/2020 0744 Gross per 24 hour  Intake 243 ml  Output 450 ml  Net -207 ml    Filed Weights   11/29/20 0700 11/30/20 1044 12/01/20 0800  Weight: 73.4 kg 73.1 kg 73 kg    Physical examination: General:  Average built, not in obvious distress, on nasal cannula oxygen, alert awake  communicative HENT:   Mild pallor noted, oral mucosa is moist.  Chest: .  Diminished breath sounds bilaterally.  No obvious crackles or wheezes CVS: S1 &S2 heard. No murmur.  Regular rate and rhythm. Abdomen: Soft, nontender, nondistended.  Bowel sounds are heard.   Extremities: No cyanosis, clubbing or edema.  Peripheral pulses are palpable. Psych: Alert, awake and communicative, normal mood CNS:  No cranial nerve deficits.  Moves all extremities Skin: Warm and dry.  No rashes noted.  Data Reviewed:  Following data were reviewed.  CBC: Recent Labs  Lab 11/25/20 1329 11/26/20 0017 11/27/20 0025 11/28/20 0032 11/29/20 0141 11/30/20 0057 12/01/20 0057  WBC 10.8* 11.2* 10.4  --  16.9* 15.0*  --   HGB 7.0* 6.7* 6.3* 6.9* 7.0* 7.1* 7.4*  HCT 20.9* 20.0* 19.2* 20.3* 21.4* 21.2* 23.0*  MCV 93.3 93.5 95.5  --  97.3 96.8  --   PLT 134* 119* 133*  --  145* 122*  --     Basic Metabolic Panel: Recent Labs  Lab 11/25/20 0059 11/26/20 0017 11/27/20 0025 11/28/20 0549 11/29/20 0141  NA 133* 130* 130* 133* 133*  K 3.8 4.0 4.2 4.5 4.1  CL 97* 96* 96* 98 97*  CO2 24 28 27 25 27   GLUCOSE 84 94 100* 137* 82  BUN 84* 33* 45* 29* 23  CREATININE 9.61* 5.80* 7.88* 6.42* 5.19*  CALCIUM 8.2* 8.3* 8.3* 8.9 8.9  MG  --   --   --   --  2.0    GFR: Estimated Creatinine Clearance: 10.6 mL/min (A) (by C-G formula based on SCr of 5.19 mg/dL (H)). Liver Function Tests: Recent Labs  Lab 11/28/20 0549  AST 44*  ALT 35  ALKPHOS 142*  BILITOT 0.6  PROT 6.5  ALBUMIN 2.6*    Recent Labs  Lab 11/28/20 0549  LIPASE 72*    No results for input(s): AMMONIA in the last 168 hours. Coagulation Profile: No results for input(s): INR, PROTIME in the last 168 hours. Cardiac Enzymes: No results for input(s): CKTOTAL, CKMB, CKMBINDEX, TROPONINI in the last 168 hours. BNP (last 3 results) No results for input(s): PROBNP in the last 8760 hours. HbA1C: No results for input(s): HGBA1C in the last  72 hours. CBG: Recent Labs  Lab 11/28/20 0519  GLUCAP 116*    Lipid Profile: No results for input(s): CHOL, HDL, LDLCALC, TRIG, CHOLHDL, LDLDIRECT in the last 72 hours. Thyroid Function Tests: No results for input(s): TSH, T4TOTAL, FREET4, T3FREE, THYROIDAB in the last 72 hours. Anemia Panel: No results for input(s): VITAMINB12, FOLATE, FERRITIN, TIBC, IRON, RETICCTPCT in the last 72 hours.  Urine analysis: No results found for: COLORURINE, APPEARANCEUR, LABSPEC, PHURINE, GLUCOSEU, HGBUR, BILIRUBINUR, KETONESUR, PROTEINUR, UROBILINOGEN, NITRITE, LEUKOCYTESUR Sepsis Labs: @LABRCNTIP (procalcitonin:4,lacticidven:4)  ) No results found for this or any previous visit (from the past 240 hour(s)).     Scheduled Meds:  Chlorhexidine Gluconate Cloth  6 each Topical Q0600   ferric citrate  420 mg Oral BID WC   folic acid  1 mg Oral Daily   ketorolac  1 drop Left Eye QID  mouth rinse  15 mL Mouth Rinse BID   multivitamin  1 tablet Oral Daily   ofloxacin  1 drop Left Eye QID   pantoprazole  40 mg Oral BID   polyethylene glycol  17 g Oral Daily   prednisoLONE acetate  1 drop Left Eye QID   sodium chloride flush  3 mL Intravenous Q12H   torsemide  10 mg Oral Daily   Continuous Infusions:    LOS: 11 days   Flora Lipps, MD [facility] 12/01/2020, 10:29 AM

## 2020-12-01 NOTE — Progress Notes (Addendum)
Progress Note  Patient Name: [name] Colon Date of Encounter: 12/01/2020  [facility] Cardiologist: Candee Furbish, MD   Subjective   Denies any further CP or SOB.  Hbg up to 7.4.    Inpatient Medications    Scheduled Meds:  Chlorhexidine Gluconate Cloth  6 each Topical Q0600   Darbepoetin Alfa       darbepoetin (ARANESP) injection - DIALYSIS  200 mcg Intravenous Once in dialysis   ferric citrate  420 mg Oral BID WC   folic acid  1 mg Oral Daily   heparin sodium (porcine)       ketorolac  1 drop Left Eye QID   mouth rinse  15 mL Mouth Rinse BID   multivitamin  1 tablet Oral Daily   ofloxacin  1 drop Left Eye QID   pantoprazole  40 mg Oral BID   polyethylene glycol  17 g Oral Daily   prednisoLONE acetate  1 drop Left Eye QID   sodium chloride flush  3 mL Intravenous Q12H   torsemide  10 mg Oral Daily   Continuous Infusions:   PRN Meds: acetaminophen **OR** acetaminophen, calcium carbonate (dosed in mg elemental calcium), camphor-menthol **AND** hydrOXYzine, docusate sodium, feeding supplement (NEPRO CARB STEADY), fentaNYL (SUBLIMAZE) injection, lidocaine (PF), lidocaine-prilocaine, nitroGLYCERIN, ondansetron **OR** ondansetron (ZOFRAN) IV, pentafluoroprop-tetrafluoroeth, sorbitol, zolpidem   Vital Signs    Vitals:   11/30/20 1924 11/30/20 2336 12/01/20 0333 12/01/20 0712  BP: (!) 127/59 134/70 125/60 129/68  Pulse: 80 96 89 88  Resp: 19 15 20 20   Temp: 97.9 F (36.6 C) 98.6 F (37 C) 98.3 F (36.8 C) 97.6 F (36.4 C)  TempSrc: Oral Oral Oral Oral  SpO2: 100% 98% 100% 94%  Weight:      Height:        Intake/Output Summary (Last 24 hours) at 12/01/2020 0841 Last data filed at 12/01/2020 0744 Gross per 24 hour  Intake 243 ml  Output 450 ml  Net -207 ml    Last 3 Weights 11/30/2020 11/29/2020 11/28/2020  Weight (lbs) 161 lb 1.6 oz 161 lb 13.1 oz 158 lb 15.2 oz  Weight (kg) 73.074 kg 73.4 kg 72.1 kg      Telemetry    NSR  Personally Reviewed  ECG     No new EKG to review- Personally Reviewed  Physical Exam   GEN: Well nourished, well developed in no acute distress HEENT: Normal NECK: No JVD; No carotid bruits LYMPHATICS: No lymphadenopathy CARDIAC:RRR, no murmurs, rubs, gallops RESPIRATORY:  Clear to auscultation without rales, wheezing or rhonchi  ABDOMEN: Soft, non-tender, non-distended MUSCULOSKELETAL:  No edema; No deformity  SKIN: Warm and dry NEUROLOGIC:  Alert and oriented x 3 PSYCHIATRIC:  Normal affect   Labs    High Sensitivity Troponin:   Recent Labs  Lab 11/18/20 1520 11/19/20 2341 11/20/20 0155 11/28/20 0549 11/28/20 0808  TROPONINIHS 45* 84* 102* 696* 1,191*       Chemistry Recent Labs  Lab 11/27/20 0025 11/28/20 0549 11/29/20 0141  NA 130* 133* 133*  K 4.2 4.5 4.1  CL 96* 98 97*  CO2 27 25 27   GLUCOSE 100* 137* 82  BUN 45* 29* 23  CREATININE 7.88* 6.42* 5.19*  CALCIUM 8.3* 8.9 8.9  PROT  --  6.5  --   ALBUMIN  --  2.6*  --   AST  --  44*  --   ALT  --  35  --   ALKPHOS  --  142*  --  BILITOT  --  0.6  --   GFRNONAA 6* 8* 10*  ANIONGAP 7 10 9       Hematology Recent Labs  Lab 11/27/20 0025 11/28/20 0032 11/29/20 0141 11/30/20 0057 12/01/20 0057  WBC 10.4  --  16.9* 15.0*  --   RBC 2.01*  --  2.20* 2.19*  --   HGB 6.3*   < > 7.0* 7.1* 7.4*  HCT 19.2*   < > 21.4* 21.2* 23.0*  MCV 95.5  --  97.3 96.8  --   MCH 31.3  --  31.8 32.4  --   MCHC 32.8  --  32.7 33.5  --   RDW 16.2*  --  19.1* 19.0*  --   PLT 133*  --  145* 122*  --    < > = values in this interval not displayed.     BNP Recent Labs  Lab 11/28/20 0549  BNP 2,018.3*      DDimer No results for input(s): DDIMER in the last 168 hours.   Radiology    No results found.  Cardiac Studies   Echocardiogram this admission  1. Endocardial border tracking is poor resulting in inaccurate automated  LVEF assessment. Mild, global hypokinesis. Left ventricular ejection  fraction, by estimation, is 45 to 50%. The  left ventricle has mildly  decreased function. The left ventricle  demonstrates global hypokinesis. Left ventricular diastolic parameters are  consistent with Grade I diastolic dysfunction (impaired relaxation).  Elevated left ventricular end-diastolic pressure.   2. Right ventricular systolic function is normal. The right ventricular  size is normal. There is moderately elevated pulmonary artery systolic  pressure.   3. Left atrial size was moderately dilated.   4. The mitral valve is normal in structure. Mild mitral valve  regurgitation. No evidence of mitral stenosis.   5. The aortic valve is tricuspid. There is mild calcification of the  aortic valve. There is mild thickening of the aortic valve. Aortic valve  regurgitation is mild. No aortic stenosis is present.   6. Aortic dilatation noted. There is borderline dilatation of the aortic  root, measuring 36 mm. There is mild dilatation of the ascending aorta,  measuring 37 mm.   7. The inferior vena cava is normal in size with greater than 50%  respiratory variability, suggesting right atrial pressure of 3 mmHg.  Patient Profile     80 y.o. male with what appears to be vasovagal syncope during hemodialysis with mildly reduced ejection fraction 45 to 50%  Assessment & Plan    Vasovagal syncope - This was accompanied by burning indigestion-like discomfort, nausea, hematemesis.   --had 2 episodes of unresponsiveness during a bout of nausea vomiting hematemesis during hemodialysis with bradycardia noted into the 30s on telemetry consistent with vasovagal syncope.  He has classic prodrome of feeling poor, sweating, nauseous. --no further episodes of dizziness or syncope and c/w vasovagal syncope --Try to avoid offending vagal stimuli, avoid vomiting/hematemesis. --No indication for pacemaker.  Chest pain /Elevated troponin/Flash pulmonary edema -- EGD showed ulcerated lesions at the GEJ secondary to [employer] tear treated with  hemoclips -- had recurrent abdominal pain last night radiating into his chest that was burning similar to initial presentation --patient has had recurrent CP with Nausea and SOB improved with emergent HD --hsTrop elevated 45>84>102>696>1191 --Hbg dropped to 6.3 but up to 7.4 today --patient is Jehovah's witness and refuses blood --very limited options at this time for assessment of elevated trop and EKG changes given underlying severe  anemia -- I have talked with interventional colleagues regarding cath but given low Hbg and refusal for blood products he is currently deemed not to be a cath candidate --if we can get his Hbg above 8 (needs groin access with higher risk of bleeding) then could proceed with diagnostic cath but then if found to have CAD there is still the issue of whether it is safe to place on DAPT in setting of severe anemia -BP has been soft and have not been able to tolerate addition of GDMT for angina with BB or nitrates -Palliative Care Consult has been consulted for goals of care  Chronic systolic heart failure - Mildly reduced ejection fraction 45 to 50% -- initial mild flat hsTrop elevation 44>45>84>102  felt likely demand ischemia in the setting of GI bleed, N/V but had recurrent sx and now hsTrop [phone] -- suspect that elevated hsTrop still due to demand ischemia in the setting of severe anemia -- no focal wall motion abnormality on echo with mild diffuse HK recently  - Not a candidate for ACE inhibitor or Entresto spironolactone Jardiance given his end-stage renal disease status. - We are also holding off of AV nodal blocking agents such as metoprolol given his prior vasovagal syncope.  Anemia of renal disease - Hemoglobin 7.3 a few days ago and dropped to 6.3 a few days ago in setting of recurrent CP and is now 7.4 -- He is Jehovah's Witness. 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 --got HD complicated  by hypotension>>renal feels that acute respiratory failure likely related to another cause other than volume overload such as coronary ischemia or aspiration pneumonitis.  I agree that likely related to acute coronary ischemia but options at this time for treatment are limited   For questions or updates, please contact [place] Please consult [email or web address] for contact info under        Signed, Fransico Him, MD  12/01/2020, 8:41 AM

## 2020-12-01 NOTE — Progress Notes (Signed)
[facility] Progress Note   Assessment/ Plan:    OP HD: [facility] MWF   4h  77.5kg  3/2.0 bath  400/1.5  TDC  Hep 2400 Mircera 50 mcg q 4 weeks, last given 11/13/20 Calcitriol 0.75 mcg q rx Binder- Auryxia 210 mg 1 TID AC  Assessment/ Plan SOB/ respiratory distress/ pulm edema/ vol overload: resolved w/ HD. Resp distress again on 8/25, responded well to further volume lowering w/ HD. Now is down 6.5kg under prior dry wt. Repeat CXR, wean O2 as tolerated. Anemia ckd and GIB - got Aranesp 100 mcg 11/22/20 with HD, will ^ to max 200 ug darbe w/ HD today. s/p feraheme 510 mg x 2 here for low tsat 13%. Hb up to 7.3 today.  UGIB: s/p EGD 8/19 with some bleeding gastric ulcers, clipped.  Would use caution with carafate in ESRD (can cause aluminum toxicity).  Chest pain/ vasovagal syncope - appreciate cardiology assistance ESRD: MWF HD.  HD Sunday(today) off schedule. Plan short HD tomorrow to get back on schedule.  HTN/ volume: not on any BP meds. 6kg under dry wt, lower further w/ HD today as BP tolerates.  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 H/o prostate cancer H/o colon cancer  Kelly Splinter, MD 12/01/2020, 2:07 PM     Subjective:   No c/o today   Objective:   BP 118/84 (BP Location: Right Arm)   Pulse 80   Temp 98.1 F (36.7 C) (Oral)   Resp 18   Ht 5\' 8"  (1.727 m)   Wt 71 kg   SpO2 98%   BMI 23.80 kg/m   Physical Exam: GEN lying in bed, no distress, nasal O2 HEENT EOMI PERRL NECK no JVD PULM clear  CV RRR ABD soft, mildly distended, improved EXT no LE edema NEURO AAO x 3 nonfocal [facility]  Labs: BMET Recent Labs  Lab 11/25/20 0059 11/26/20 0017 11/27/20 0025 11/28/20 0549 11/29/20 0141  NA 133* 130* 130* 133* 133*  K 3.8 4.0 4.2 4.5 4.1  CL 97* 96* 96* 98 97*  CO2 24 28 27 25 27   GLUCOSE 84 94 100* 137* 82  BUN 84* 33* 45*  29* 23  CREATININE 9.61* 5.80* 7.88* 6.42* 5.19*  CALCIUM 8.2* 8.3* 8.3* 8.9 8.9    CBC Recent Labs  Lab 11/26/20 0017 11/27/20 0025 11/28/20 0032 11/29/20 0141 11/30/20 0057 12/01/20 0057  WBC 11.2* 10.4  --  16.9* 15.0*  --   HGB 6.7* 6.3* 6.9* 7.0* 7.1* 7.4*  HCT 20.0* 19.2* 20.3* 21.4* 21.2* 23.0*  MCV 93.5 95.5  --  97.3 96.8  --   PLT 119* 133*  --  145* 122*  --        Medications:     Chlorhexidine Gluconate Cloth  6 each Topical Q0600   ferric citrate  420 mg Oral BID WC   folic acid  1 mg Oral Daily   ketorolac  1 drop Left Eye QID   mouth rinse  15 mL Mouth Rinse BID   multivitamin  1 tablet Oral Daily   ofloxacin  1 drop Left Eye QID   pantoprazole  40 mg Oral BID   polyethylene glycol  17 g Oral Daily   prednisoLONE acetate  1 drop Left Eye QID   sodium chloride flush  3 mL Intravenous Q12H   torsemide  10 mg  Oral Daily

## 2020-12-02 DIAGNOSIS — I214 Non-ST elevation (NSTEMI) myocardial infarction: Secondary | ICD-10-CM

## 2020-12-02 DIAGNOSIS — R55 Syncope and collapse: Secondary | ICD-10-CM | POA: Diagnosis not present

## 2020-12-02 DIAGNOSIS — I5022 Chronic systolic (congestive) heart failure: Secondary | ICD-10-CM | POA: Diagnosis not present

## 2020-12-02 DIAGNOSIS — D649 Anemia, unspecified: Secondary | ICD-10-CM | POA: Diagnosis not present

## 2020-12-02 LAB — CBC
HCT: 22.1 % — ABNORMAL LOW (ref 39.0–52.0)
Hemoglobin: 7.4 g/dL — ABNORMAL LOW (ref 13.0–17.0)
MCH: 31.9 pg (ref 26.0–34.0)
MCHC: 33.5 g/dL (ref 30.0–36.0)
MCV: 95.3 fL (ref 80.0–100.0)
Platelets: 122 10*3/uL — ABNORMAL LOW (ref 150–400)
RBC: 2.32 MIL/uL — ABNORMAL LOW (ref 4.22–5.81)
RDW: 18.6 % — ABNORMAL HIGH (ref 11.5–15.5)
WBC: 12.1 10*3/uL — ABNORMAL HIGH (ref 4.0–10.5)
nRBC: 0 % (ref 0.0–0.2)

## 2020-12-02 LAB — BASIC METABOLIC PANEL
Anion gap: 10 (ref 5–15)
BUN: 53 mg/dL — ABNORMAL HIGH (ref 8–23)
CO2: 28 mmol/L (ref 22–32)
Calcium: 8.4 mg/dL — ABNORMAL LOW (ref 8.9–10.3)
Chloride: 94 mmol/L — ABNORMAL LOW (ref 98–111)
Creatinine, Ser: 8.37 mg/dL — ABNORMAL HIGH (ref 0.61–1.24)
GFR, Estimated: 6 mL/min — ABNORMAL LOW (ref >60)
Glucose, Bld: 111 mg/dL — ABNORMAL HIGH (ref 70–99)
Potassium: 4.3 mmol/L (ref 3.5–5.1)
Sodium: 132 mmol/L — ABNORMAL LOW (ref 135–145)

## 2020-12-02 LAB — MAGNESIUM: Magnesium: 2.3 mg/dL (ref 1.7–2.4)

## 2020-12-02 MED ORDER — PANTOPRAZOLE SODIUM 40 MG PO TBEC: 40.0000 mg | DELAYED_RELEASE_TABLET | Freq: Every day | ORAL | 0 refills | Status: DC

## 2020-12-02 MED ORDER — FOLIC ACID 1 MG PO TABS: 1.0000 mg | ORAL_TABLET | Freq: Every day | ORAL | 0 refills | Status: DC

## 2020-12-02 MED ORDER — HEPARIN SODIUM (PORCINE) 1000 UNIT/ML DIALYSIS
2400.0000 [IU] | Freq: Once | INTRAMUSCULAR | Status: DC
  Filled 2020-12-02 (×2): qty 3

## 2020-12-02 MED ORDER — FERRIC CITRATE 1 GM 210 MG(FE) PO TABS: 420.0000 mg | ORAL_TABLET | Freq: Two times a day (BID) | ORAL | 0 refills | Status: DC

## 2020-12-02 MED ORDER — ONDANSETRON HCL 4 MG PO TABS: 4.0000 mg | ORAL_TABLET | Freq: Four times a day (QID) | ORAL | 0 refills | Status: DC | PRN

## 2020-12-02 NOTE — Progress Notes (Signed)
Progress Note  Patient Name: [name] Colon Date of Encounter: 12/02/2020  [facility] Cardiologist: Candee Furbish, MD   Subjective   Denies any further CP or SOB but does report some lightheadedness.  BP 123/63 this AM. Hgb stable at 7.4.    Inpatient Medications    Scheduled Meds:  Chlorhexidine Gluconate Cloth  6 each Topical Q0600   ferric citrate  420 mg Oral BID WC   folic acid  1 mg Oral Daily   ketorolac  1 drop Left Eye QID   mouth rinse  15 mL Mouth Rinse BID   multivitamin  1 tablet Oral Daily   ofloxacin  1 drop Left Eye QID   pantoprazole  40 mg Oral BID   polyethylene glycol  17 g Oral Daily   prednisoLONE acetate  1 drop Left Eye QID   sodium chloride flush  3 mL Intravenous Q12H   torsemide  10 mg Oral Daily   Continuous Infusions:   PRN Meds: acetaminophen **OR** acetaminophen, calcium carbonate (dosed in mg elemental calcium), camphor-menthol **AND** hydrOXYzine, docusate sodium, feeding supplement (NEPRO CARB STEADY), fentaNYL (SUBLIMAZE) injection, lidocaine (PF), lidocaine-prilocaine, nitroGLYCERIN, ondansetron **OR** ondansetron (ZOFRAN) IV, pentafluoroprop-tetrafluoroeth, sorbitol, zolpidem   Vital Signs    Vitals:   12/01/20 2300 12/01/20 2326 12/02/20 0325 12/02/20 0748  BP: (!) 123/59 129/61 (!) 119/58 123/63  Pulse: 88 90 87 95  Resp:  18 17 14   Temp:  98.3 F (36.8 C) 98.4 F (36.9 C) 98 F (36.7 C)  TempSrc:  Oral Oral Axillary  SpO2: 98% 99% 96% 97%  Weight:      Height:        Intake/Output Summary (Last 24 hours) at 12/02/2020 0803 Last data filed at 12/02/2020 0754 Gross per 24 hour  Intake 403 ml  Output 2180 ml  Net -1777 ml    Last 3 Weights 12/01/2020 12/01/2020 11/30/2020  Weight (lbs) 156 lb 8.4 oz 160 lb 15 oz 161 lb 1.6 oz  Weight (kg) 71 kg 73 kg 73.074 kg      Telemetry    NSR in 80s  Personally Reviewed  ECG    No new EKG to review- Personally Reviewed  Physical Exam   GEN: in no acute  distress HEENT: Normal NECK: No JVD; No carotid bruits CARDIAC:RRR, no murmurs, rubs, gallops RESPIRATORY:  Clear to auscultation without rales, wheezing or rhonchi  ABDOMEN: Soft, non-tender, non-distended MUSCULOSKELETAL:  No edema; No deformity  SKIN: Warm and dry NEUROLOGIC:  Alert and oriented x 3 PSYCHIATRIC:  Normal affect   Labs    High Sensitivity Troponin:   Recent Labs  Lab 11/18/20 1520 11/19/20 2341 11/20/20 0155 11/28/20 0549 11/28/20 0808  TROPONINIHS 45* 84* 102* 696* 1,191*       Chemistry Recent Labs  Lab 11/28/20 0549 11/29/20 0141 12/02/20 0033  NA 133* 133* 132*  K 4.5 4.1 4.3  CL 98 97* 94*  CO2 25 27 28   GLUCOSE 137* 82 111*  BUN 29* 23 53*  CREATININE 6.42* 5.19* 8.37*  CALCIUM 8.9 8.9 8.4*  PROT 6.5  --   --   ALBUMIN 2.6*  --   --   AST 44*  --   --   ALT 35  --   --   ALKPHOS 142*  --   --   BILITOT 0.6  --   --   GFRNONAA 8* 10* 6*  ANIONGAP 10 9 10       Hematology Recent Labs  Lab  11/29/20 0141 11/30/20 0057 12/01/20 0057 12/02/20 0033  WBC 16.9* 15.0*  --  12.1*  RBC 2.20* 2.19*  --  2.32*  HGB 7.0* 7.1* 7.4* 7.4*  HCT 21.4* 21.2* 23.0* 22.1*  MCV 97.3 96.8  --  95.3  MCH 31.8 32.4  --  31.9  MCHC 32.7 33.5  --  33.5  RDW 19.1* 19.0*  --  18.6*  PLT 145* 122*  --  122*     BNP Recent Labs  Lab 11/28/20 0549  BNP 2,018.3*      DDimer No results for input(s): DDIMER in the last 168 hours.   Radiology    No results found.  Cardiac Studies   Echocardiogram this admission  1. Endocardial border tracking is poor resulting in inaccurate automated  LVEF assessment. Mild, global hypokinesis. Left ventricular ejection  fraction, by estimation, is 45 to 50%. The left ventricle has mildly  decreased function. The left ventricle  demonstrates global hypokinesis. Left ventricular diastolic parameters are  consistent with Grade I diastolic dysfunction (impaired relaxation).  Elevated left ventricular  end-diastolic pressure.   2. Right ventricular systolic function is normal. The right ventricular  size is normal. There is moderately elevated pulmonary artery systolic  pressure.   3. Left atrial size was moderately dilated.   4. The mitral valve is normal in structure. Mild mitral valve  regurgitation. No evidence of mitral stenosis.   5. The aortic valve is tricuspid. There is mild calcification of the  aortic valve. There is mild thickening of the aortic valve. Aortic valve  regurgitation is mild. No aortic stenosis is present.   6. Aortic dilatation noted. There is borderline dilatation of the aortic  root, measuring 36 mm. There is mild dilatation of the ascending aorta,  measuring 37 mm.   7. The inferior vena cava is normal in size with greater than 50%  respiratory variability, suggesting right atrial pressure of 3 mmHg.  Patient Profile     80 y.o. male with what appears to be vasovagal syncope during hemodialysis with mildly reduced ejection fraction 45 to 50%  Assessment & Plan    Vasovagal syncope - This was accompanied by burning indigestion-like discomfort, nausea, hematemesis.   --had 2 episodes of unresponsiveness during a bout of nausea vomiting hematemesis during hemodialysis with bradycardia noted into the 30s on telemetry consistent with vasovagal syncope.  He has classic prodrome of feeling poor, sweating, nauseous. --no further episodes of dizziness or syncope and c/w vasovagal syncope --Try to avoid offending vagal stimuli, avoid vomiting/hematemesis. --No indication for pacemaker.  Chest pain /Elevated troponin/Flash pulmonary edema -- EGD showed ulcerated lesions at the GEJ secondary to [employer] tear treated with hemoclips --patient has had recurrent CP with Nausea and SOB improved with emergent HD --hsTrop elevated 45>84>102>696>1191 --Hbg dropped to 6.3 but up to 7.4 today --patient is Jehovah's witness and refuses blood --very limited options at  this time for assessment of elevated trop and EKG changes given underlying severe anemia -- This was discussed last week with interventional colleagues regarding cath but given low Hbg and refusal for blood products he is currently deemed not to be a cath candidate -Medical management recommended.  Currently denies any anginal symptoms -Palliative Care Consult has been consulted for goals of care  Chronic systolic heart failure - Mildly reduced ejection fraction 45 to 50% -- initial mild flat hsTrop elevation 44>45>84>102  felt likely demand ischemia in the setting of GI bleed, N/V but had recurrent sx and now  hsTrop [phone] -- suspect that elevated hsTrop still due to demand ischemia in the setting of severe anemia -- no focal wall motion abnormality on echo with mild diffuse HK recently  - Not a candidate for ACE inhibitor or Entresto spironolactone Jardiance given his end-stage renal disease status. - We are also holding off of AV nodal blocking agents such as metoprolol given his prior vasovagal syncope.  Anemia of renal disease - Hemoglobin dropped to 6.3 in setting of recurrent CP and is now 7.4 -- He is Jehovah's Witness. 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 --got HD complicated by hypotension>>renal feels that acute respiratory failure likely related to another cause other than volume overload such as coronary ischemia or aspiration pneumonitis.  Agree that likely related to acute coronary ischemia but options at this time for treatment are limited   For questions or updates, please contact [place] Please consult [email or web address] for contact info under        Signed, Donato Heinz, MD  12/02/2020, 8:03 AM

## 2020-12-02 NOTE — Progress Notes (Signed)
Pt's daughter called earlier on status of Pt and discharge tonight.  Advised that the pt will be discharged home tomorrow morning and not tonight due to late dialysis today

## 2020-12-02 NOTE — Progress Notes (Signed)
HD dept. called and they are ready for the pt. Report given to RN.

## 2020-12-02 NOTE — Progress Notes (Signed)
[facility] ([facility])  Hospital Liaison: RN note         This patient has been referred to our palliative care services in the community.  [facility] will continue to follow for any discharge planning needs and to coordinate continuation of palliative care in the outpatient setting.    If you have questions or need assistance, please call [phone] or contact the hospital Liaison listed on AMION.      Thank you for this referral.         Mary Anne Robertson, RN, CCM  [facility] Hospital Liaison   [phone] 

## 2020-12-02 NOTE — Plan of Care (Signed)
  Problem: Health Behavior/Discharge Planning: Goal: Ability to manage health-related needs will improve Outcome: Progressing   Problem: Clinical Measurements: Goal: Ability to maintain clinical measurements within normal limits will improve Outcome: Progressing Goal: Will remain free from infection Outcome: Progressing Goal: Diagnostic test results will improve Outcome: Progressing Goal: Respiratory complications will improve Outcome: Progressing Goal: Cardiovascular complication will be avoided Outcome: Progressing   Problem: Activity: Goal: Risk for activity intolerance will decrease Outcome: Progressing   Problem: Elimination: Goal: Will not experience complications related to bowel motility Outcome: Progressing   Problem: Pain Managment: Goal: General experience of comfort will improve Outcome: Progressing   Problem: Safety: Goal: Ability to remain free from injury will improve Outcome: Progressing   Problem: Skin Integrity: Goal: Risk for impaired skin integrity will decrease Outcome: Progressing   Problem: Education: Goal: Ability to describe self-care measures that may prevent or decrease complications (Diabetes Survival Skills Education) will improve Outcome: Progressing Goal: Individualized Educational Video(s) Outcome: Progressing   Problem: Coping: Goal: Ability to adjust to condition or change in health will improve Outcome: Progressing   Problem: Fluid Volume: Goal: Ability to maintain a balanced intake and output will improve Outcome: Progressing   Problem: Health Behavior/Discharge Planning: Goal: Ability to identify and utilize available resources and services will improve Outcome: Progressing Goal: Ability to manage health-related needs will improve Outcome: Progressing   Problem: Metabolic: Goal: Ability to maintain appropriate glucose levels will improve Outcome: Progressing   Problem: Nutritional: Goal: Maintenance of adequate  nutrition will improve Outcome: Progressing Goal: Progress toward achieving an optimal weight will improve Outcome: Progressing   Problem: Tissue Perfusion: Goal: Adequacy of tissue perfusion will improve Outcome: Progressing   Problem: Skin Integrity: Goal: Risk for impaired skin integrity will decrease Outcome: Progressing

## 2020-12-02 NOTE — Progress Notes (Signed)
Called HD and ask about the schedule of HD but claimed they have emergencies and cannot give exact time. Second call made at 2 pm  claimed that schedule is between 5pm and 5:30 pm.asked them if they can schedule a little bit early for pt is to be discharged home after HD but to no avail.  Pt. Made aware and it is ok with him.

## 2020-12-02 NOTE — TOC Transition Note (Signed)
Transition of Care [facility]) - CM/SW Discharge Note   Patient Details  Name: [name] Colon MRN: [id number] Date of Birth: 05-04-1940  Transition of Care [facility]) CM/SW Contact:  Zenon Mayo, RN Phone Number: 12/02/2020, 10:14 AM   Clinical Narrative:    Patient is for dc today, [place] with [facility] notified of dc,  also NCM spoke with Thamas Jaegers, his daughter [phone] offerred choice for  outpatient palliative services, she chose [facility],  NCM made referral to [facility] with [facility].     Final next level of care: [place] Barriers to Discharge: No Barriers Identified   Patient Goals and CMS Choice Patient states their goals for this hospitalization and ongoing recovery are:: return home CMS Medicare.gov Compare Post Acute Care list provided to:: Patient Represenative (must comment) Choice offered to / list presented to : Adult Children  Discharge Placement                       Discharge Plan and Services   Discharge Planning Services: CM Consult Post Acute Care Choice: Home Health, Durable Medical Equipment          DME Arranged: [place] rolling DME Agency: [employer] Date DME Agency Contacted: 11/26/20 Time DME Agency Contacted: 84 Representative spoke with at DME Agency: [place]: PT [place]: [place] Date [place]: 11/26/20 Time [place]: 72 Representative spoke with at [place]: [place] ([place]) Interventions     Readmission Risk Interventions Readmission Risk Prevention Plan 11/26/2020  Transportation Screening Complete  HRI or [place] Complete  Social Work Consult for [place] Planning/Counseling Complete  Palliative Care Screening Not Applicable  Medication Review Press photographer) Complete

## 2020-12-02 NOTE — Progress Notes (Signed)
Occupational Therapy Treatment Patient Details Name: [name] Colon MRN: [id number] DOB: 1940-09-19 Today's Date: 12/02/2020    History of present illness 80 y.o. man who came to the ED on 11/18/20 with chest pain, but tests were negative. He returned on 11/19/20 with CP, SOB requring bipap and nitroglycerin after missed HD session. Urgent HD with CPR during HD and vomiting blood, 1 min CPR with ROSC. PMHx: colon cancer, ESRD, hep C, cirrhosis, HTN.   OT comments  Patient seen by skilled OT to address functional transfers and self care.  Patient stated he felt weak today.  Patient was able to get to eob with [facility] raised and had no complaints of dizziness while sitting or standing. Patient used RW for mobility and transfers.  Grooming and UB bathing performed seated at sink and patient stood for perineal cleaning with min assist for thoroughness.  Patient returned to bed following self care and nursing informed.  Follow Up Recommendations  No OT follow up    Equipment Recommendations  None recommended by OT    Recommendations for Other Services      Precautions / Restrictions Precautions Precautions: Fall Precaution Comments: watch orthostatics, mod fall       Mobility Bed Mobility Overal bed mobility: Modified Independent Bed Mobility: Supine to Sit     Supine to sit: HOB elevated;Modified independent (Device/Increase time) Sit to supine: Modified independent (Device/Increase time);HOB elevated   General bed mobility comments: Mod I with HOB elevated to 45 degrees    Transfers Overall transfer level: Needs assistance Equipment used: Rolling walker (2 wheeled) Transfers: Sit to/from Stand Sit to Stand: Min guard Stand pivot transfers: Min guard       General transfer comment:  (min guard assist due to complaints of feeling weak)    Balance Overall balance assessment: Needs assistance Sitting-balance support: Feet supported;No upper extremity supported Sitting  balance-Leahy Scale: Good     Standing balance support: Single extremity supported;During functional activity Standing balance-Leahy Scale: Fair Standing balance comment:  (Stood with min guard while performing perineal cleaning)                           ADL either performed or assessed with clinical judgement   ADL Overall ADL's : Needs assistance/impaired     Grooming: Wash/dry hands;Wash/dry face;Oral care;Supervision/safety;Sitting Grooming Details (indicate cue type and reason):  (Patient performed grooming seated due to fatigue) Upper Body Bathing: Supervision/ safety;Sitting Upper Body Bathing Details (indicate cue type and reason):  (Assistance with back only) Lower Body Bathing: Minimal assistance;Sit to/from stand Lower Body Bathing Details (indicate cue type and reason):  (Stood for perineal cleaning with min guard and min assist for thoroughness) Upper Body Dressing : Minimal assistance Upper Body Dressing Details (indicate cue type and reason):  (min assist due to lines) Lower Body Dressing: Supervision/safety;Sitting/lateral leans Lower Body Dressing Details (indicate cue type and reason):  (doffing and donning non-skid socks)             Functional mobility during ADLs: Min guard;Rolling walker;Cueing for safety General ADL Comments: monitored patient for orthostatic hypotension     Vision       Perception     Praxis      Cognition Arousal/Alertness: Awake/alert Behavior During Therapy: WFL for tasks assessed/performed Overall Cognitive Status: Within Functional Limits for tasks assessed  General Comments: pt pleasant and making appropriate conversation        Exercises     Shoulder Instructions       General Comments      Pertinent Vitals/ Pain       Pain Assessment: No/denies pain  Home Living                                          Prior  Functioning/Environment              Frequency  Min 2X/week        Progress Toward Goals  OT Goals(current goals can now be found in the care plan section)  Progress towards OT goals: Progressing toward goals  Acute Rehab OT Goals Patient Stated Goal: return home OT Goal Formulation: With patient Time For Goal Achievement: 12/07/20 Potential to Achieve Goals: Good ADL Goals Pt Will Perform Grooming: with supervision;standing Pt Will Perform Lower Body Bathing: with supervision;sit to/from stand Pt Will Perform Lower Body Dressing: with supervision;sit to/from stand Pt Will Transfer to Toilet: with supervision;ambulating Pt Will Perform Toileting - Clothing Manipulation and hygiene: with supervision;sit to/from stand  Plan Discharge plan remains appropriate    Co-evaluation                 AM-PAC OT "6 Clicks" Daily Activity     Outcome Measure   Help from another person eating meals?: None Help from another person taking care of personal grooming?: A Little Help from another person toileting, which includes using toliet, bedpan, or urinal?: A Little Help from another person bathing (including washing, rinsing, drying)?: A Little Help from another person to put on and taking off regular upper body clothing?: None Help from another person to put on and taking off regular lower body clothing?: A Little 6 Click Score: 20    End of Session    OT Visit Diagnosis: Unsteadiness on feet (R26.81);History of falling (Z91.81)   Activity Tolerance Patient tolerated treatment well   Patient Left in bed;with call bell/phone within reach   Nurse Communication Mobility status        Time: 2094-7096 OT Time Calculation (min): 32 min  Charges: OT General Charges $OT Visit: 1 Visit OT Treatments $Self Care/Home Management : 23-37 mins  Lodema Hong, [place] 12/02/2020, 9:00 AM

## 2020-12-02 NOTE — Progress Notes (Signed)
Just took pt. for HD and told daughter  on the phone to come and pick him up at around  9:30 pm after HD. Daughter called back and claimed that they cannot come to pick him  up tonight. MD made aware  Discharge order for today cancelled for tom.

## 2020-12-02 NOTE — Progress Notes (Signed)
RT note. Patient currently on HD at this time. BIPAP is in patients room if needed once returning. RT will continue to monitor

## 2020-12-02 NOTE — Progress Notes (Signed)
   Palliative Medicine Inpatient Follow Up Note  Per note review, [name] Colon will transition home today.   OP Palliative care has been requested through [facility] for ongoing [place] conversations and MOST completion. Otherwise there are no additional requests for the inpatient PMT at this time.   We will sign off but pls re-consult [place] if additional needs arise.  ______________________________________________________________________________________ [place] Team Team Cell Phone: [phone] Please utilize secure chat with additional questions, if there is no response within 30 minutes please call the above phone number  Palliative Medicine Team providers are available by phone from 7am to 7pm daily and can be reached through the team cell phone.  Should this patient require assistance outside of these hours, please call the patient's attending physician.

## 2020-12-02 NOTE — Progress Notes (Signed)
[facility] ROUNDING NOTE   Subjective:   Interval History: No complaints this morning.  This is an 80 year old gentleman with history of colon cancer and end-stage renal disease hemodialysis dependent.  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  Presents to the hospital with chest pain and missing dialysis treatments.  He was admitted 11/19/2020.  Hospitalization was complicated by a goal to rapid response for chest congestion and chest pain with elevated troponins 11/28/2020.  Recent 2D echo showed an ejection fraction of 40%.  Patient is Jehovah's Witness and is being treated with IV iron and Epogen.  Patient underwent upper endoscopy 11/22/2020 with gastric ulcers noted.  Patient is receiving Carafate.  This has a aluminum, need to be used sparingly in dialysis patients.  Blood pressure 120/58 pulse 86 temperature 98.4 O2 sats 90% 2 L nasal cannula.  Last dialysis 12/01/2020 with 2.4 L removed.  Sodium 132 potassium 4.3: 94 CO2 28 BUN 53 creatinine 8.37 glucose 111 calcium 8.4 hemoglobin 7.4  Patient planned for short dialysis treatment 12/02/2020 as patient is on Monday Wednesday Friday schedule at [facility]  Chest x-ray showed improved lung volumes applied silver ventilation and residual streaky lung base opacity.  11/29/2018     Objective:  Vital signs in last 24 hours:  Temp:  [97.6 F (36.4 C)-98.6 F (37 C)] 98.4 F (36.9 C) (08/29 0325) Pulse Rate:  [78-90] 87 (08/29 0325) Resp:  [17-20] 17 (08/29 0325) BP: (106-129)/(55-84) 119/58 (08/29 0325) SpO2:  [94 %-99 %] 96 % (08/29 0325) Weight:  [71 kg-73 kg] 71 kg (08/28 1033)  Weight change: -0.075 kg Filed Weights   11/30/20 1044 12/01/20 0800 12/01/20 1033  Weight: 73.1 kg 73 kg 71 kg    Intake/Output: I/O last 3 completed shifts: In: 443 [P.O.:440; I.V.:3] Out: 2630 [Urine:630; Other:2000]   Intake/Output this shift:  Total I/O In: 3 [I.V.:3] Out: -   GEN lying in bed, no distress,  nasal O2 HEENT EOMI PERRL NECK no JVD PULM clear  CV RRR ABD soft, mildly distended, improved EXT no LE edema NEURO AAO x 3 nonfocal [facility]   Basic Metabolic Panel: Recent Labs  Lab 11/26/20 0017 11/27/20 0025 11/28/20 0549 11/29/20 0141 12/02/20 0033  NA 130* 130* 133* 133* 132*  K 4.0 4.2 4.5 4.1 4.3  CL 96* 96* 98 97* 94*  CO2 28 27 25 27 28   GLUCOSE 94 100* 137* 82 111*  BUN 33* 45* 29* 23 53*  CREATININE 5.80* 7.88* 6.42* 5.19* 8.37*  CALCIUM 8.3* 8.3* 8.9 8.9 8.4*  MG  --   --   --  2.0 2.3    Liver Function Tests: Recent Labs  Lab 11/28/20 0549  AST 44*  ALT 35  ALKPHOS 142*  BILITOT 0.6  PROT 6.5  ALBUMIN 2.6*   Recent Labs  Lab 11/28/20 0549  LIPASE 72*   No results for input(s): AMMONIA in the last 168 hours.  CBC: Recent Labs  Lab 11/26/20 0017 11/27/20 0025 11/28/20 0032 11/29/20 0141 11/30/20 0057 12/01/20 0057 12/02/20 0033  WBC 11.2* 10.4  --  16.9* 15.0*  --  12.1*  HGB 6.7* 6.3* 6.9* 7.0* 7.1* 7.4* 7.4*  HCT 20.0* 19.2* 20.3* 21.4* 21.2* 23.0* 22.1*  MCV 93.5 95.5  --  97.3 96.8  --  95.3  PLT 119* 133*  --  145* 122*  --  122*    Cardiac Enzymes: No results for input(s): CKTOTAL, CKMB, CKMBINDEX, TROPONINI in the last  168 hours.  BNP: Invalid input(s): POCBNP  CBG: Recent Labs  Lab 11/28/20 0519  GLUCAP 116*    Microbiology: Results for orders placed or performed during the hospital encounter of 11/19/20  Resp Panel by RT-PCR (Flu A&B, Covid) Nasopharyngeal Swab     Status: None   Collection Time: 11/20/20 12:02 AM   Specimen: Nasopharyngeal Swab; Nasopharyngeal(NP) swabs in vial transport medium  Result Value Ref Range Status   SARS Coronavirus 2 by RT PCR NEGATIVE NEGATIVE Final    Comment: (NOTE) SARS-CoV-2 target nucleic acids are NOT DETECTED.  The SARS-CoV-2 RNA is generally detectable in upper respiratory specimens during the acute phase of infection. The lowest concentration of SARS-CoV-2  viral copies this assay can detect is 138 copies/mL. A negative result does not preclude SARS-Cov-2 infection and should not be used as the sole basis for treatment or other patient management decisions. A negative result may occur with  improper specimen collection/handling, submission of specimen other than nasopharyngeal swab, presence of viral mutation(s) within the areas targeted by this assay, and inadequate number of viral copies(<138 copies/mL). A negative result must be combined with clinical observations, patient history, and epidemiological information. The expected result is Negative.  Fact Sheet for Patients:  EntrepreneurPulse.com.au  Fact Sheet for Healthcare Providers:  IncredibleEmployment.be  This test is no t yet approved or cleared by the [place] FDA and  has been authorized for detection and/or diagnosis of SARS-CoV-2 by FDA under an Emergency Use Authorization (EUA). This EUA will remain  in effect (meaning this test can be used) for the duration of the COVID-19 declaration under Section 564(b)(1) of the Act, 21 U.S.C.section 360bbb-3(b)(1), unless the authorization is terminated  or revoked sooner.       Influenza A by PCR NEGATIVE NEGATIVE Final   Influenza B by PCR NEGATIVE NEGATIVE Final    Comment: (NOTE) The Xpert Xpress SARS-CoV-2/FLU/RSV plus assay is intended as an aid in the diagnosis of influenza from Nasopharyngeal swab specimens and should not be used as a sole basis for treatment. Nasal washings and aspirates are unacceptable for Xpert Xpress SARS-CoV-2/FLU/RSV testing.  Fact Sheet for Patients: EntrepreneurPulse.com.au  Fact Sheet for Healthcare Providers: IncredibleEmployment.be  This test is not yet approved or cleared by the [place] FDA and has been authorized for detection and/or diagnosis of SARS-CoV-2 by FDA under an Emergency Use Authorization  (EUA). This EUA will remain in effect (meaning this test can be used) for the duration of the COVID-19 declaration under Section 564(b)(1) of the Act, 21 U.S.C. section 360bbb-3(b)(1), unless the authorization is terminated or revoked.  Performed at [facility], [place] [address], [place], [place] [address]   MRSA Next Gen by PCR, Nasal     Status: None   Collection Time: 11/20/20 10:18 AM   Specimen: Nasal Mucosa; Nasal Swab  Result Value Ref Range Status   MRSA by PCR Next Gen NOT DETECTED NOT DETECTED Final    Comment: (NOTE) The GeneXpert MRSA Assay (FDA approved for NASAL specimens only), is one component of a comprehensive MRSA colonization surveillance program. It is not intended to diagnose MRSA infection nor to guide or monitor treatment for MRSA infections. Test performance is not FDA approved in patients less than 50 years old. Performed at [facility], [place] [address] [place], [place] [address]     Coagulation Studies: No results for input(s): LABPROT, INR in the last 72 hours.  Urinalysis: No results for input(s): COLORURINE, LABSPEC, [place], [place],  HGBUR, BILIRUBINUR, KETONESUR, PROTEINUR, UROBILINOGEN, NITRITE, LEUKOCYTESUR in the last 72 hours.  Invalid input(s): APPERANCEUR    Imaging: No results found.   Medications:     Chlorhexidine Gluconate Cloth  6 each Topical Q0600   ferric citrate  420 mg Oral BID WC   folic acid  1 mg Oral Daily   ketorolac  1 drop Left Eye QID   mouth rinse  15 mL Mouth Rinse BID   multivitamin  1 tablet Oral Daily   ofloxacin  1 drop Left Eye QID   pantoprazole  40 mg Oral BID   polyethylene glycol  17 g Oral Daily   prednisoLONE acetate  1 drop Left Eye QID   sodium chloride flush  3 mL Intravenous Q12H   torsemide  10 mg Oral Daily   acetaminophen **OR** acetaminophen, calcium carbonate (dosed in mg elemental calcium), camphor-menthol **AND** hydrOXYzine, docusate sodium, feeding supplement (NEPRO  CARB STEADY), fentaNYL (SUBLIMAZE) injection, lidocaine (PF), lidocaine-prilocaine, nitroGLYCERIN, ondansetron **OR** ondansetron (ZOFRAN) IV, pentafluoroprop-tetrafluoroeth, sorbitol, zolpidem  Assessment/ Plan:  OP HD: [facility] MWF   4h  77.5kg  3/2.0 bath  400/1.5  TDC  Hep 2400 Mircera 50 mcg q 4 weeks, last given 11/13/20 Calcitriol 0.75 mcg q rx Binder- Auryxia 210 mg 1 TID AC  Assessment/ Plan SOB/ respiratory distress/ pulm edema/ vol overload: resolved w/ HD. Resp distress again on 8/25, responded well to further volume lowering w/ HD. Now is down 6.5kg under prior dry wt.  wean O2 as tolerated. Anemia ckd and GIB -Aranesp to max 200 ug darbe w/ HD today. s/p feraheme 510 mg x 2 here for low tsat 13%.  Hemoglobin stable UGIB: s/p EGD 8/19 with some bleeding gastric ulcers, clipped.  Would use caution with carafate in ESRD (can cause aluminum toxicity).  Chest pain/ vasovagal syncope - appreciate cardiology assistance ESRD: MWF HD.  HD Sunday(today) off schedule.  Dialysis planned 12/02/2020 HTN/ volume: Continue to challenge dry weight.  Continues on torsemide 10 mg daily 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[facility], cured 2014 per their notes H/o prostate cancer H/o colon cancer    LOS: [place] @TODAY @6 :35 AM

## 2020-12-03 ENCOUNTER — Telehealth: Payer: Self-pay | Admitting: Gastroenterology

## 2020-12-03 ENCOUNTER — Other Ambulatory Visit: Payer: Self-pay

## 2020-12-03 ENCOUNTER — Encounter: Payer: Self-pay | Admitting: Gastroenterology

## 2020-12-03 DIAGNOSIS — J810 Acute pulmonary edema: Secondary | ICD-10-CM | POA: Diagnosis not present

## 2020-12-03 DIAGNOSIS — I5043 Acute on chronic combined systolic (congestive) and diastolic (congestive) heart failure: Secondary | ICD-10-CM | POA: Diagnosis not present

## 2020-12-03 DIAGNOSIS — D649 Anemia, unspecified: Secondary | ICD-10-CM | POA: Diagnosis not present

## 2020-12-03 DIAGNOSIS — I5022 Chronic systolic (congestive) heart failure: Secondary | ICD-10-CM | POA: Diagnosis not present

## 2020-12-03 DIAGNOSIS — I214 Non-ST elevation (NSTEMI) myocardial infarction: Secondary | ICD-10-CM | POA: Diagnosis not present

## 2020-12-03 DIAGNOSIS — R55 Syncope and collapse: Secondary | ICD-10-CM | POA: Diagnosis not present

## 2020-12-03 DIAGNOSIS — E8770 Fluid overload, unspecified: Secondary | ICD-10-CM | POA: Diagnosis not present

## 2020-12-03 DIAGNOSIS — B182 Chronic viral hepatitis C: Secondary | ICD-10-CM | POA: Diagnosis not present

## 2020-12-03 MED ORDER — PANTOPRAZOLE SODIUM 40 MG PO TBEC: 40.0000 mg | DELAYED_RELEASE_TABLET | Freq: Every day | ORAL | 0 refills | Status: DC

## 2020-12-03 MED ORDER — CHLORHEXIDINE GLUCONATE CLOTH 2 % EX PADS
6.0000 | MEDICATED_PAD | Freq: Every day | CUTANEOUS | Status: DC
  Administered 2020-12-03: 6 via TOPICAL

## 2020-12-03 NOTE — Telephone Encounter (Signed)
Prescription was sent by staff on the fourth floor after procedure. I resent Protonix to patients pharmacy [facility] on [address] and [address].

## 2020-12-03 NOTE — Progress Notes (Signed)
PROGRESS NOTE    [name] Colon  YKD:[id number] DOB: 06-03-1940 DOA: 11/19/2020 PCP: Leonel Ramsay, MD   Brief Narrative:   Patient is 80 years old male with history of colon cancer, end-stage renal disease on hemodialysis, hepatitis C cirrhosis of liver, hypertension presented to hospital with chest pain and had missed hemodialysis.  He also had worsening shortness of breath and initially required BiPAP and nitro drip.  During hospitalization, patient had significant episode of bradycardia and required CPR. 2D echocardiogram showed reduced LV function.  GI and nephrology followed the patient during hospitalization.  Patient underwent endoscopic evaluation with Hemoclip placement.  Patient is Jehovah's Witness and has been treated with IV iron and EPO.  During hospitalization, on 11/28/2020 patient had a rapid response called in for congestion, chest pain dyspnea.  He did have elevated troponins.  Patient was put on BiPAP and cardiology was consulted.  Patient underwent hemodialysis with improvement in his symptoms.  Patient has significantly improved at this time.  Assessment & Plan:  Chest pain, congestion, tachycardia, dyspnea, elevated troponins with EKG changes with respiratory distress. Resolved at this time.  Likely secondary to Non-ST elevation MI with pulmonary edema.  Was on BiPAP briefly.  Recent 2D echocardiogram with LV function of 45 to 50% with left ventricular hypokinesis.  At this time cardiology has recommended conservative treatment.  Constipation.  Improved continue with bowel regimen.  Volume overload On presentation.  Improved at this time.  Secondary to missed hemodialysis.  Nephrology on board.  Continue hemodialysis as oer nephrology.  Patient was initially on nasal cannula but currently on room air  Vasovagal syncope Resolved.  2D echocardiogram with mildly reduced LV function.    Acute blood loss anemia with underlying anemia of chronic disease Upper GI  bleed, gastroesophageal junction ulcers Patient underwent endoscopy with Hemoclip placement x3.  Continue Protonix twice daily.  Tolerating p.o. diet.  Patient is Jehovah's Witness.  Has been receiving Aranesp and received Feraheme twice during hospitalization..   continue iron and Aranesp.  Latest hemoglobin of 7.4  ESRD on hemodialysis Nephrology on board for hemodialysis.  Hep C, cirrhosis Compensated at this time.  Debility, deconditioning with orthostatic hypotension Improved.  Likely home health on discharge  DVT prophylaxis:  SCDs  Code Status:  Full code  Family Communication:  None    Disposition Plan:  Status is: Inpatient  Remains inpatient appropriate because:Inpatient level of care appropriate due to severity of illness  Dispo: The patient is from: Home              Anticipated d/c is to: Home with home health likely tomorrow after hemodialysis tonight.              Patient currently is  medically stable to d/c.   Difficult to place patient No  Consultants:  Cardiology Nephrology GI  Procedures:   EGD 11/22/20 ulcerated lesions at [facility], treated with hemoclips X3 Hemodialysis BiPAP placement  Antimicrobials:  None  Subjective: Today, patient was seen and examined at bedside.  Feels well.  Denies any dizziness lightheadedness shortness of breath cough fever.  Is supposed to go for hemodialysis today but will likely be late in the evening.  Objective:  Vitals:   12/02/20 2300 12/03/20 0100 12/03/20 0400 12/03/20 0727  BP: 128/62 (!) 126/55 117/62 127/66  Pulse: 88 82 95 95  Resp: 14 17 18 19   Temp:   98.4 F (36.9 C) 98.6 F (37 C)  TempSrc:   Oral Oral  SpO2: 92% 94% 95% 92%  Weight:      Height:        Intake/Output Summary (Last 24 hours) at 12/03/2020 1600 Last data filed at 12/03/2020 0735 Gross per 24 hour  Intake 363 ml  Output --  Net 363 ml    Filed Weights   12/01/20 0800 12/01/20 1033 12/02/20 1840  Weight: 73 kg 71  kg 73.6 kg    Physical examination: General:  Average built, not in obvious distress, on nasal cannula oxygen, alert awake communicative HENT:   Mild pallor noted, oral mucosa is moist.  Chest: .  Diminished breath sounds bilaterally. CVS: S1 &S2 heard. No murmur.  Regular rate and rhythm. Abdomen: Soft, nontender, nondistended.  Bowel sounds are heard.   Extremities: No cyanosis, clubbing or edema.  Peripheral pulses are palpable. Psych: Alert, awake and communicative, normal mood CNS:  No cranial nerve deficits.  Moves all extremities Skin: Warm and dry.  No rashes noted.  Data Reviewed:  Following data were reviewed.  CBC: Recent Labs  Lab 11/27/20 0025 11/28/20 0032 11/29/20 0141 11/30/20 0057 12/01/20 0057 12/02/20 0033  WBC 10.4  --  16.9* 15.0*  --  12.1*  HGB 6.3* 6.9* 7.0* 7.1* 7.4* 7.4*  HCT 19.2* 20.3* 21.4* 21.2* 23.0* 22.1*  MCV 95.5  --  97.3 96.8  --  95.3  PLT 133*  --  145* 122*  --  122*    Basic Metabolic Panel: Recent Labs  Lab 11/27/20 0025 11/28/20 0549 11/29/20 0141 12/02/20 0033  NA 130* 133* 133* 132*  K 4.2 4.5 4.1 4.3  CL 96* 98 97* 94*  CO2 27 25 27 28   GLUCOSE 100* 137* 82 111*  BUN 45* 29* 23 53*  CREATININE 7.88* 6.42* 5.19* 8.37*  CALCIUM 8.3* 8.9 8.9 8.4*  MG  --   --  2.0 2.3    GFR: Estimated Creatinine Clearance: 6.6 mL/min (A) (by C-G formula based on SCr of 8.37 mg/dL (H)). Liver Function Tests: Recent Labs  Lab 11/28/20 0549  AST 44*  ALT 35  ALKPHOS 142*  BILITOT 0.6  PROT 6.5  ALBUMIN 2.6*    Recent Labs  Lab 11/28/20 0549  LIPASE 72*    No results for input(s): AMMONIA in the last 168 hours. Coagulation Profile: No results for input(s): INR, PROTIME in the last 168 hours. Cardiac Enzymes: No results for input(s): CKTOTAL, CKMB, CKMBINDEX, TROPONINI in the last 168 hours. BNP (last 3 results) No results for input(s): PROBNP in the last 8760 hours. HbA1C: No results for input(s): HGBA1C in the last  72 hours. CBG: Recent Labs  Lab 11/28/20 0519  GLUCAP 116*    Lipid Profile: No results for input(s): CHOL, HDL, LDLCALC, TRIG, CHOLHDL, LDLDIRECT in the last 72 hours. Thyroid Function Tests: No results for input(s): TSH, T4TOTAL, FREET4, T3FREE, THYROIDAB in the last 72 hours. Anemia Panel: No results for input(s): VITAMINB12, FOLATE, FERRITIN, TIBC, IRON, RETICCTPCT in the last 72 hours.  Urine analysis: No results found for: COLORURINE, APPEARANCEUR, LABSPEC, PHURINE, GLUCOSEU, HGBUR, BILIRUBINUR, KETONESUR, PROTEINUR, UROBILINOGEN, NITRITE, LEUKOCYTESUR Sepsis Labs: @LABRCNTIP (procalcitonin:4,lacticidven:4)  ) No results found for this or any previous visit (from the past 240 hour(s)).     Scheduled Meds:  Chlorhexidine Gluconate Cloth  6 each Topical Q0600   Chlorhexidine Gluconate Cloth  6 each Topical Q0600   ferric citrate  420 mg Oral BID WC   folic acid  1 mg Oral Daily   heparin  2,400 Units Dialysis  Once in dialysis   ketorolac  1 drop Left Eye QID   mouth rinse  15 mL Mouth Rinse BID   multivitamin  1 tablet Oral Daily   ofloxacin  1 drop Left Eye QID   pantoprazole  40 mg Oral BID   polyethylene glycol  17 g Oral Daily   prednisoLONE acetate  1 drop Left Eye QID   sodium chloride flush  3 mL Intravenous Q12H   torsemide  10 mg Oral Daily   Continuous Infusions:    LOS: 13 days   Flora Lipps, MD [facility]

## 2020-12-03 NOTE — Progress Notes (Signed)
[name] Colon ROUNDING NOTE   Subjective:   Interval History: No complaints this morning.  This is an 80 year old gentleman with history of colon cancer and end-stage renal disease hemodialysis dependent.  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  Presents to the hospital with chest pain and missing dialysis treatments.  He was admitted 11/19/2020.  Hospitalization was complicated by a goal to rapid response for chest congestion and chest pain with elevated troponins 11/28/2020.  Recent 2D echo showed an ejection fraction of 40%.  Patient is Jehovah's Witness and is being treated with IV iron and Epogen.  Patient underwent upper endoscopy 11/22/2020 with gastric ulcers noted.  Patient is receiving Carafate.  This has a aluminum, need to be used sparingly in dialysis patients.  Blood pressure 117/60 pulse 87 temperature 98.4 O2 sats 89% room air.  Status post dialysis 12/02/2020.  Sodium 132 potassium 4.3 chloride 94 CO2 28 BUN 53 creatinine 8.37 glucose 111 hemoglobin 7.4  We will continue dialysis on a Monday Wednesday Friday schedule his next dialysis to be 12/04/2020   Chest x-ray showed improved lung volumes applied silver ventilation and residual streaky lung base opacity.  11/29/2018     Objective:  Vital signs in last 24 hours:  Temp:  [97.5 F (36.4 C)-98.4 F (36.9 C)] 98.4 F (36.9 C) (08/30 0400) Pulse Rate:  [77-95] 95 (08/30 0400) Resp:  [14-24] 18 (08/30 0400) BP: (109-133)/(54-66) 117/62 (08/30 0400) SpO2:  [92 %-97 %] 95 % (08/30 0400) Weight:  [73.6 kg] 73.6 kg (08/29 1840)  Weight change: 0.6 kg Filed Weights   12/01/20 0800 12/01/20 1033 12/02/20 1840  Weight: 73 kg 71 kg 73.6 kg    Intake/Output: I/O last 3 completed shifts: In: 643 [P.O.:640; I.V.:3] Out: 2180 [Urine:180; Other:2000]   Intake/Output this shift:  Total I/O In: 3 [I.V.:3] Out: -   GEN lying in bed, no distress, nasal O2 HEENT EOMI PERRL NECK no JVD PULM  clear  CV RRR ABD soft, mildly distended, improved EXT no LE edema NEURO AAO x 3 nonfocal [facility]   Basic Metabolic Panel: Recent Labs  Lab 11/27/20 0025 11/28/20 0549 11/29/20 0141 12/02/20 0033  NA 130* 133* 133* 132*  K 4.2 4.5 4.1 4.3  CL 96* 98 97* 94*  CO2 27 25 27 28   GLUCOSE 100* 137* 82 111*  BUN 45* 29* 23 53*  CREATININE 7.88* 6.42* 5.19* 8.37*  CALCIUM 8.3* 8.9 8.9 8.4*  MG  --   --  2.0 2.3     Liver Function Tests: Recent Labs  Lab 11/28/20 0549  AST 44*  ALT 35  ALKPHOS 142*  BILITOT 0.6  PROT 6.5  ALBUMIN 2.6*    Recent Labs  Lab 11/28/20 0549  LIPASE 72*    No results for input(s): AMMONIA in the last 168 hours.  CBC: Recent Labs  Lab 11/27/20 0025 11/28/20 0032 11/29/20 0141 11/30/20 0057 12/01/20 0057 12/02/20 0033  WBC 10.4  --  16.9* 15.0*  --  12.1*  HGB 6.3* 6.9* 7.0* 7.1* 7.4* 7.4*  HCT 19.2* 20.3* 21.4* 21.2* 23.0* 22.1*  MCV 95.5  --  97.3 96.8  --  95.3  PLT 133*  --  145* 122*  --  122*     Cardiac Enzymes: No results for input(s): CKTOTAL, CKMB, CKMBINDEX, TROPONINI in the last 168 hours.  BNP: Invalid input(s): POCBNP  CBG: Recent Labs  Lab 11/28/20 0519  GLUCAP 116*     Microbiology:  Results for orders placed or performed during the hospital encounter of 11/19/20  Resp Panel by RT-PCR (Flu A&B, Covid) Nasopharyngeal Swab     Status: None   Collection Time: 11/20/20 12:02 AM   Specimen: Nasopharyngeal Swab; Nasopharyngeal(NP) swabs in vial transport medium  Result Value Ref Range Status   SARS Coronavirus 2 by RT PCR NEGATIVE NEGATIVE Final    Comment: (NOTE) SARS-CoV-2 target nucleic acids are NOT DETECTED.  The SARS-CoV-2 RNA is generally detectable in upper respiratory specimens during the acute phase of infection. The lowest concentration of SARS-CoV-2 viral copies this assay can detect is 138 copies/mL. A negative result does not preclude SARS-Cov-2 infection and should not be used  as the sole basis for treatment or other patient management decisions. A negative result may occur with  improper specimen collection/handling, submission of specimen other than nasopharyngeal swab, presence of viral mutation(s) within the areas targeted by this assay, and inadequate number of viral copies(<138 copies/mL). A negative result must be combined with clinical observations, patient history, and epidemiological information. The expected result is Negative.  Fact Sheet for Patients:  EntrepreneurPulse.com.au  Fact Sheet for Healthcare Providers:  IncredibleEmployment.be  This test is no t yet approved or cleared by the [place] FDA and  has been authorized for detection and/or diagnosis of SARS-CoV-2 by FDA under an Emergency Use Authorization (EUA). This EUA will remain  in effect (meaning this test can be used) for the duration of the COVID-19 declaration under Section 564(b)(1) of the Act, 21 U.S.C.section 360bbb-3(b)(1), unless the authorization is terminated  or revoked sooner.       Influenza A by PCR NEGATIVE NEGATIVE Final   Influenza B by PCR NEGATIVE NEGATIVE Final    Comment: (NOTE) The Xpert Xpress SARS-CoV-2/FLU/RSV plus assay is intended as an aid in the diagnosis of influenza from Nasopharyngeal swab specimens and should not be used as a sole basis for treatment. Nasal washings and aspirates are unacceptable for Xpert Xpress SARS-CoV-2/FLU/RSV testing.  Fact Sheet for Patients: EntrepreneurPulse.com.au  Fact Sheet for Healthcare Providers: IncredibleEmployment.be  This test is not yet approved or cleared by the [place] FDA and has been authorized for detection and/or diagnosis of SARS-CoV-2 by FDA under an Emergency Use Authorization (EUA). This EUA will remain in effect (meaning this test can be used) for the duration of the COVID-19 declaration under Section 564(b)(1)  of the Act, 21 U.S.C. section 360bbb-3(b)(1), unless the authorization is terminated or revoked.  Performed at [facility], [place] [address], [place], [place] [address]   MRSA Next Gen by PCR, Nasal     Status: None   Collection Time: 11/20/20 10:18 AM   Specimen: Nasal Mucosa; Nasal Swab  Result Value Ref Range Status   MRSA by PCR Next Gen NOT DETECTED NOT DETECTED Final    Comment: (NOTE) The GeneXpert MRSA Assay (FDA approved for NASAL specimens only), is one component of a comprehensive MRSA colonization surveillance program. It is not intended to diagnose MRSA infection nor to guide or monitor treatment for MRSA infections. Test performance is not FDA approved in patients less than 42 years old. Performed at [facility], [place] [address], [place], [place] [address]     Coagulation Studies: No results for input(s): LABPROT, INR in the last 72 hours.  Urinalysis: No results for input(s): COLORURINE, LABSPEC, PHURINE, GLUCOSEU, HGBUR, BILIRUBINUR, KETONESUR, PROTEINUR, UROBILINOGEN, NITRITE, LEUKOCYTESUR in the last 72 hours.  Invalid input(s): APPERANCEUR    Imaging: No results  found.   Medications:     Chlorhexidine Gluconate Cloth  6 each Topical Q0600   ferric citrate  420 mg Oral BID WC   folic acid  1 mg Oral Daily   heparin  2,400 Units Dialysis Once in dialysis   ketorolac  1 drop Left Eye QID   mouth rinse  15 mL Mouth Rinse BID   multivitamin  1 tablet Oral Daily   ofloxacin  1 drop Left Eye QID   pantoprazole  40 mg Oral BID   polyethylene glycol  17 g Oral Daily   prednisoLONE acetate  1 drop Left Eye QID   sodium chloride flush  3 mL Intravenous Q12H   torsemide  10 mg Oral Daily   acetaminophen **OR** acetaminophen, calcium carbonate (dosed in mg elemental calcium), camphor-menthol **AND** hydrOXYzine, docusate sodium, feeding supplement (NEPRO CARB STEADY), fentaNYL (SUBLIMAZE) injection, lidocaine (PF), lidocaine-prilocaine,  nitroGLYCERIN, ondansetron **OR** ondansetron (ZOFRAN) IV, pentafluoroprop-tetrafluoroeth, sorbitol, zolpidem  Assessment/ Plan:  OP HD: [facility] MWF   4h  77.5kg  3/2.0 bath  400/1.5  TDC  Hep 2400 Mircera 50 mcg q 4 weeks, last given 11/13/20 Calcitriol 0.75 mcg q rx Binder- Auryxia 210 mg 1 TID AC  Assessment/ Plan SOB/ respiratory distress/ pulm edema/ vol overload: resolved w/ HD. Resp distress again on 8/25, responded well to further volume lowering w/ HD. Now is down 6.5kg under prior dry wt.  wean O2 as tolerated. Anemia ckd and GIB -Aranesp to max 200 ug darbe w/ HD today. s/p feraheme 510 mg x 2 here for low tsat 13%.  Hemoglobin stable UGIB: s/p EGD 8/19 with some bleeding gastric ulcers, clipped.  Would use caution with carafate in ESRD (can cause aluminum toxicity).  Chest pain/ vasovagal syncope - appreciate cardiology assistance ESRD: MWF HD.  Next dialysis planned for 12/04/2020 HTN/ volume: Continue to challenge dry weight.  Continues on torsemide 10 mg daily 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[facility], cured 2014 per their notes H/o prostate cancer H/o colon cancer    LOS: [place] @TODAY @6 :58 AM

## 2020-12-03 NOTE — Progress Notes (Signed)
Pt returned from Dialysis.

## 2020-12-03 NOTE — Telephone Encounter (Signed)
Inbound call from patient daugther [name]. States the prescription for protonix was not received by the pharmacy. She would like the prescription sent to [facility] on [address] and [address]

## 2020-12-03 NOTE — Progress Notes (Signed)
Discharged home accompanied by son, rolling walker and belongings taken home.

## 2020-12-03 NOTE — Progress Notes (Addendum)
Progress Note  Patient Name: [name] Colon Date of Encounter: 12/03/2020  [facility] Cardiologist: Candee Furbish, MD   Subjective   Denies any further CP or SOB.  Lightheadedness improved.  BP 127/66 this AM. Hgb stable at 7.4.    Inpatient Medications    Scheduled Meds:  Chlorhexidine Gluconate Cloth  6 each Topical Q0600   Chlorhexidine Gluconate Cloth  6 each Topical Q0600   ferric citrate  420 mg Oral BID WC   folic acid  1 mg Oral Daily   heparin  2,400 Units Dialysis Once in dialysis   ketorolac  1 drop Left Eye QID   mouth rinse  15 mL Mouth Rinse BID   multivitamin  1 tablet Oral Daily   ofloxacin  1 drop Left Eye QID   pantoprazole  40 mg Oral BID   polyethylene glycol  17 g Oral Daily   prednisoLONE acetate  1 drop Left Eye QID   sodium chloride flush  3 mL Intravenous Q12H   torsemide  10 mg Oral Daily   Continuous Infusions:   PRN Meds: acetaminophen **OR** acetaminophen, calcium carbonate (dosed in mg elemental calcium), camphor-menthol **AND** hydrOXYzine, docusate sodium, feeding supplement (NEPRO CARB STEADY), fentaNYL (SUBLIMAZE) injection, lidocaine (PF), lidocaine-prilocaine, nitroGLYCERIN, ondansetron **OR** ondansetron (ZOFRAN) IV, pentafluoroprop-tetrafluoroeth, sorbitol, zolpidem   Vital Signs    Vitals:   12/02/20 2300 12/03/20 0100 12/03/20 0400 12/03/20 0727  BP: 128/62 (!) 126/55 117/62 127/66  Pulse: 88 82 95 95  Resp: 14 17 18 19   Temp:   98.4 F (36.9 C) 98.6 F (37 C)  TempSrc:   Oral Oral  SpO2: 92% 94% 95% 92%  Weight:      Height:        Intake/Output Summary (Last 24 hours) at 12/03/2020 0820 Last data filed at 12/02/2020 2217 Gross per 24 hour  Intake 3 ml  Output --  Net 3 ml    Last 3 Weights 12/02/2020 12/01/2020 12/01/2020  Weight (lbs) 162 lb 4.1 oz 156 lb 8.4 oz 160 lb 15 oz  Weight (kg) 73.6 kg 71 kg 73 kg      Telemetry    NSR in 80-90s  Personally Reviewed  ECG    No new EKG to review- Personally  Reviewed  Physical Exam   GEN: in no acute distress HEENT: Normal NECK: No JVD; No carotid bruits CARDIAC:RRR, no murmurs, rubs, gallops RESPIRATORY:  Clear to auscultation without rales, wheezing or rhonchi  ABDOMEN: Soft, non-tender, non-distended MUSCULOSKELETAL:  No edema; No deformity  SKIN: Warm and dry NEUROLOGIC:  Alert and oriented x 3 PSYCHIATRIC:  Normal affect   Labs    High Sensitivity Troponin:   Recent Labs  Lab 11/18/20 1520 11/19/20 2341 11/20/20 0155 11/28/20 0549 11/28/20 0808  TROPONINIHS 45* 84* 102* 696* 1,191*       Chemistry Recent Labs  Lab 11/28/20 0549 11/29/20 0141 12/02/20 0033  NA 133* 133* 132*  K 4.5 4.1 4.3  CL 98 97* 94*  CO2 25 27 28   GLUCOSE 137* 82 111*  BUN 29* 23 53*  CREATININE 6.42* 5.19* 8.37*  CALCIUM 8.9 8.9 8.4*  PROT 6.5  --   --   ALBUMIN 2.6*  --   --   AST 44*  --   --   ALT 35  --   --   ALKPHOS 142*  --   --   BILITOT 0.6  --   --   GFRNONAA 8* 10* 6*  ANIONGAP  10 9 10       Hematology Recent Labs  Lab 11/29/20 0141 11/30/20 0057 12/01/20 0057 12/02/20 0033  WBC 16.9* 15.0*  --  12.1*  RBC 2.20* 2.19*  --  2.32*  HGB 7.0* 7.1* 7.4* 7.4*  HCT 21.4* 21.2* 23.0* 22.1*  MCV 97.3 96.8  --  95.3  MCH 31.8 32.4  --  31.9  MCHC 32.7 33.5  --  33.5  RDW 19.1* 19.0*  --  18.6*  PLT 145* 122*  --  122*     BNP Recent Labs  Lab 11/28/20 0549  BNP 2,018.3*      DDimer No results for input(s): DDIMER in the last 168 hours.   Radiology    No results found.  Cardiac Studies   Echocardiogram this admission  1. Endocardial border tracking is poor resulting in inaccurate automated  LVEF assessment. Mild, global hypokinesis. Left ventricular ejection  fraction, by estimation, is 45 to 50%. The left ventricle has mildly  decreased function. The left ventricle  demonstrates global hypokinesis. Left ventricular diastolic parameters are  consistent with Grade I diastolic dysfunction (impaired  relaxation).  Elevated left ventricular end-diastolic pressure.   2. Right ventricular systolic function is normal. The right ventricular  size is normal. There is moderately elevated pulmonary artery systolic  pressure.   3. Left atrial size was moderately dilated.   4. The mitral valve is normal in structure. Mild mitral valve  regurgitation. No evidence of mitral stenosis.   5. The aortic valve is tricuspid. There is mild calcification of the  aortic valve. There is mild thickening of the aortic valve. Aortic valve  regurgitation is mild. No aortic stenosis is present.   6. Aortic dilatation noted. There is borderline dilatation of the aortic  root, measuring 36 mm. There is mild dilatation of the ascending aorta,  measuring 37 mm.   7. The inferior vena cava is normal in size with greater than 50%  respiratory variability, suggesting right atrial pressure of 3 mmHg.  Patient Profile     80 y.o. male with what appears to be vasovagal syncope during hemodialysis with mildly reduced ejection fraction 45 to 50%  Assessment & Plan    Vasovagal syncope - This was accompanied by burning indigestion-like discomfort, nausea, hematemesis.   --had 2 episodes of unresponsiveness during a bout of nausea vomiting hematemesis during hemodialysis with bradycardia noted into the 30s on telemetry consistent with vasovagal syncope.  He has classic prodrome of feeling poor, sweating, nauseous. --no further episodes of dizziness or syncope and c/w vasovagal syncope --Try to avoid offending vagal stimuli, avoid vomiting/hematemesis. --No indication for pacemaker.  Chest pain /Elevated troponin/Flash pulmonary edema -- EGD showed ulcerated lesions at the GEJ secondary to [employer] tear treated with hemoclips --patient has had recurrent CP with Nausea and SOB improved with emergent HD --hsTrop elevated 45>84>102>696>1191 --Hbg dropped to 6.3 but up to 7.4 today --patient is Jehovah's witness and  refuses blood --very limited options at this time for assessment of elevated trop and EKG changes given underlying severe anemia -- This was discussed last week with interventional colleagues regarding cath but given low Hgb and refusal for blood products he is currently deemed not to be a cath candidate -Medical management recommended.  Currently denies any anginal symptoms -Palliative Care Consult has been consulted for goals of care  Chronic systolic heart failure - Mildly reduced ejection fraction 45 to 50% -- initial mild flat hsTrop elevation 44>45>84>102  felt likely demand ischemia  in the setting of GI bleed, N/V but had recurrent sx and now hsTrop [phone] -- suspect that elevated hsTrop still due to demand ischemia in the setting of severe anemia -- no focal wall motion abnormality on echo with mild diffuse HK recently  - Not a candidate for ACE inhibitor or Entresto spironolactone Jardiance given his end-stage renal disease status. - We are also holding off of AV nodal blocking agents such as metoprolol given his prior vasovagal syncope.  Anemia of renal disease - Hemoglobin dropped to 6.3 in setting of recurrent CP and is now 7.4 -- He is Jehovah's Witness. 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 --got HD complicated by hypotension>>renal feels that acute respiratory failure likely related to another cause other than volume overload such as coronary ischemia or aspiration pneumonitis.  Agree that likely related to acute coronary ischemia but options at this time for treatment are limited  [facility] will sign off.   Medication Recommendations:  No changes Other recommendations (labs, testing, etc):  None Follow up as an outpatient:  Scheduled 9/26 with Laurann Montana, NP    For questions or updates, please contact [facility] Please consult [email or web address] for contact info under        Signed, Donato Heinz, MD  12/03/2020, 8:20 AM

## 2020-12-03 NOTE — Discharge Summary (Addendum)
Physician Discharge Summary  [name] Colon VZC:[id number] DOB: July 27, 1940 DOA: 11/19/2020  PCP: Leonel Ramsay, MD  Admit date: 11/19/2020 Discharge date: 12/03/2020  Admitted From: Home  Discharge disposition: Home with home health  Recommendations for Outpatient Follow-Up:   Follow up with your primary care provider in one week.  Check CBC, BMP, magnesium in the next visit Patient would benefit from outpatient palliative care services to address ongoing goals of care. Follow-up with your cardiologist as outpatient. Continue hemodialysis as outpatient  Discharge Diagnosis:   Principal Problem:   Volume overload Active Problems:   ESRD (end stage renal disease) on dialysis (HCC)   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HCC)   Hypertension   Vasovagal episode   Hematemesis   Acute on chronic combined systolic and diastolic CHF (congestive heart failure) (HCC)   Acute pulmonary edema (HCC)   Mallory-Weiss tear   Discharge Condition: Improved.  Diet recommendation: Low sodium, heart healthy.    Wound care: None.  Code status: Full.   History of Present Illness:   Patient is 80 years old male with history of colon cancer, end-stage renal disease on hemodialysis, hepatitis C cirrhosis of liver, hypertension presented to hospital with chest pain and had missed hemodialysis.  He also had worsening shortness of breath and initially required BiPAP and nitro drip.  During hospitalization, patient had significant episode of bradycardia and required CPR. 2D echocardiogram showed reduced LV function.  GI and nephrology followed the patient during hospitalization.  Patient underwent endoscopic evaluation with Hemoclip placement.  Patient is Jehovah's Witness and has been treated with IV iron and EPO. During hospitalization, on 11/28/2020 patient had a rapid response called in for congestion, chest pain dyspnea.  He did have elevated troponins.  Patient was put on BiPAP and  cardiology was consulted.  Patient underwent hemodialysis with improvement in his symptoms.  Patient has significantly improved at this time.  Hospital Course:   Following conditions were addressed during hospitalization as listed below,  Chest pain, congestion, tachycardia, dyspnea, elevated troponins with EKG changes with respiratory distress. Resolved at this time.  Likely secondary to Non-ST elevation MI with pulmonary edema.  NSTEMI ruled in . Was on BiPAP briefly.   2D echocardiogram with LV function of 45 to 50% with left ventricular hypokinesis.  At this time cardiology has recommended conservative treatment.  We will follow-up with cardiology as outpatient.   Constipation.  Improved    Volume overload On presentation.  Improved at this time.  Secondary to missed hemodialysis.  Nephrology on board.  Continue hemodialysis as oer nephrology.  Patient was initially on nasal cannula but was room air prior to discharge.   Vasovagal syncope Resolved.  2D echocardiogram with mildly reduced LV function.     Acute blood loss anemia with underlying anemia of chronic disease, Upper GI bleed, gastroesophageal junction ulcers Patient underwent endoscopy with Hemoclip placement x3.  Continue Protonix twice daily.  Tolerating p.o. diet.  Patient is Jehovah's Witness.  Has been receiving Aranesp and received Feraheme twice during hospitalization..   continue iron and Aranesp.  States hemoglobin prior to discharge was 7.4.  Patient will continue oral iron supplement on discharge.   ESRD on hemodialysis We will continue hemodialysis as outpatient   Hep C, cirrhosis Compensated at this time.   Debility, deconditioning with orthostatic hypotension Improved.  Recommending home health on discharge   Disposition.  At this time, patient is stable for disposition home with home health.  Patient will follow-up with primary  care physician and cardiology/nephrology as outpatient.  Medical Consultants:    Cardiology Nephrology GI  Procedures:    EGD 11/22/20 ulcerated lesions at [facility], treated with hemoclips X3 Hemodialysis BiPAP placement  Subjective:   Today, patient was seen and examined at bedside.  Patient denies any chest pain, shortness of breath, fever, chills or rigor.  Wishes to go home.  Discharge Exam:   Vitals:   12/03/20 0400 12/03/20 0727  BP: 117/62 127/66  Pulse: 95 95  Resp: 18 19  Temp: 98.4 F (36.9 C) 98.6 F (37 C)  SpO2: 95% 92%   Vitals:   12/02/20 2300 12/03/20 0100 12/03/20 0400 12/03/20 0727  BP: 128/62 (!) 126/55 117/62 127/66  Pulse: 88 82 95 95  Resp: 14 17 18 19   Temp:   98.4 F (36.9 C) 98.6 F (37 C)  TempSrc:   Oral Oral  SpO2: 92% 94% 95% 92%  Weight:      Height:       General: Alert awake, not in obvious distress on room air HENT: pupils equally reacting to light, mild pallor noted.  Oral mucosa is moist.  Chest:  Clear breath sounds.  Diminished breath sounds bilaterally. No crackles or wheezes.  CVS: S1 &S2 heard. No murmur.  Regular rate and rhythm. Abdomen: Soft, nontender, nondistended.  Bowel sounds are heard.   Extremities: No cyanosis, clubbing or edema.  Peripheral pulses are palpable. Psych: Alert, awake and oriented, normal mood CNS:  No cranial nerve deficits.  Power equal in all extremities.   Skin: Warm and dry.  No rashes noted.  The results of significant diagnostics from this hospitalization (including imaging, microbiology, ancillary and laboratory) are listed below for reference.     Diagnostic Studies:   DG Chest Port 1 View  Result Date: 11/20/2020 CLINICAL DATA:  Chest pain short of breath EXAM: PORTABLE CHEST 1 VIEW COMPARISON:  11/18/2020 FINDINGS: Right-sided central venous catheter tip over the cavoatrial region. Increased small bilateral pleural effusions. Stable cardiomediastinal silhouette. Worsened vascular congestion and development of mild pulmonary edema. Aortic atherosclerosis.  IMPRESSION: Increased vascular congestion and pulmonary edema compared to prior with development of small right greater than left pleural effusion Electronically Signed   By: Donavan Foil M.D.   On: 11/20/2020 00:00   ECHOCARDIOGRAM COMPLETE  Result Date: 11/20/2020    ECHOCARDIOGRAM REPORT   Patient Name:   [name] Colon Date of Exam: 11/20/2020 Medical Rec #:  [id number]        Height:       68.0 in Accession #:    [id number]       Weight:       174.0 lb Date of Birth:  1940/08/26         BSA:          1.926 m Patient Age:    80 years         BP:           159/60 mmHg Patient Gender: M                HR:           71 bpm. Exam Location:  Inpatient Procedure: 2D Echo, Cardiac Doppler and Color Doppler                          STAT ECHO Reported to: Dr Feliberto Gottron on 11/20/2020 11:08:00 AM. Indications:    Dyspnea R06.00  History:  Patient has no prior history of Echocardiogram examinations.                 Renal Disorder.  Sonographer:    Tiffany Dance RVT Referring Phys: Hailey  1. Endocardial border tracking is poor resulting in inaccurate automated LVEF assessment. Mild, global hypokinesis. Left ventricular ejection fraction, by estimation, is 45 to 50%. The left ventricle has mildly decreased function. The left ventricle demonstrates global hypokinesis. Left ventricular diastolic parameters are consistent with Grade I diastolic dysfunction (impaired relaxation). Elevated left ventricular end-diastolic pressure.  2. Right ventricular systolic function is normal. The right ventricular size is normal. There is moderately elevated pulmonary artery systolic pressure.  3. Left atrial size was moderately dilated.  4. The mitral valve is normal in structure. Mild mitral valve regurgitation. No evidence of mitral stenosis.  5. The aortic valve is tricuspid. There is mild calcification of the aortic valve. There is mild thickening of the aortic valve. Aortic valve regurgitation is  mild. No aortic stenosis is present.  6. Aortic dilatation noted. There is borderline dilatation of the aortic root, measuring 36 mm. There is mild dilatation of the ascending aorta, measuring 37 mm.  7. The inferior vena cava is normal in size with greater than 50% respiratory variability, suggesting right atrial pressure of 3 mmHg. FINDINGS  Left Ventricle: Endocardial border tracking is poor resulting in inaccurate automated LVEF assessment. Mild, global hypokinesis. Left ventricular ejection fraction, by estimation, is 45 to 50%. The left ventricle has mildly decreased function. The left ventricle demonstrates global hypokinesis. The left ventricular internal cavity size was normal in size. There is no left ventricular hypertrophy. Left ventricular diastolic parameters are consistent with Grade I diastolic dysfunction (impaired relaxation). Elevated left ventricular end-diastolic pressure. Right Ventricle: The right ventricular size is normal. No increase in right ventricular wall thickness. Right ventricular systolic function is normal. There is moderately elevated pulmonary artery systolic pressure. The tricuspid regurgitant velocity is 3.39 m/s, and with an assumed right atrial pressure of 3 mmHg, the estimated right ventricular systolic pressure is 84.1 mmHg. Left Atrium: Left atrial size was moderately dilated. Right Atrium: Right atrial size was normal in size. Pericardium: There is no evidence of pericardial effusion. Mitral Valve: The mitral valve is normal in structure. Mild mitral valve regurgitation. No evidence of mitral valve stenosis. Tricuspid Valve: The tricuspid valve is normal in structure. Tricuspid valve regurgitation is trivial. No evidence of tricuspid stenosis. Aortic Valve: The aortic valve is tricuspid. There is mild calcification of the aortic valve. There is mild thickening of the aortic valve. Aortic valve regurgitation is mild. No aortic stenosis is present. Pulmonic Valve: The  pulmonic valve was normal in structure. Pulmonic valve regurgitation is not visualized. No evidence of pulmonic stenosis. Aorta: Aortic dilatation noted. There is borderline dilatation of the aortic root, measuring 36 mm. There is mild dilatation of the ascending aorta, measuring 37 mm. Venous: The inferior vena cava is normal in size with greater than 50% respiratory variability, suggesting right atrial pressure of 3 mmHg. IAS/Shunts: No atrial level shunt detected by color flow Doppler.  LEFT VENTRICLE PLAX 2D LVIDd:         4.60 cm  Diastology LVIDs:         3.50 cm  LV e' medial:    6.42 cm/s LV PW:         1.30 cm  LV E/e' medial:  18.7 LV IVS:        1.00  cm  LV e' lateral:   7.18 cm/s LVOT diam:     2.00 cm  LV E/e' lateral: 16.7 LV SV:         75 LV SV Index:   39 LVOT Area:     3.14 cm  RIGHT VENTRICLE             IVC RV Basal diam:  3.70 cm     IVC diam: 1.80 cm RV Mid diam:    2.40 cm RV S prime:     17.40 cm/s TAPSE (M-mode): 2.1 cm LEFT ATRIUM             Index       RIGHT ATRIUM           Index LA diam:        3.60 cm 1.87 cm/m  RA Area:     18.10 cm LA Vol (A2C):   89.1 ml 46.25 ml/m RA Volume:   52.50 ml  27.25 ml/m LA Vol (A4C):   56.5 ml 29.33 ml/m LA Biplane Vol: 72.2 ml 37.48 ml/m  AORTIC VALVE LVOT Vmax:   121.00 cm/s LVOT Vmean:  78.500 cm/s LVOT VTI:    0.238 m  AORTA Ao Root diam: 3.60 cm Ao Asc diam:  3.70 cm MITRAL VALVE                TRICUSPID VALVE MV Area (PHT): 3.70 cm     TR Peak grad:   46.0 mmHg MV Decel Time: 205 msec     TR Vmax:        339.00 cm/s MV E velocity: 120.00 cm/s MV A velocity: 108.00 cm/s  SHUNTS MV E/A ratio:  1.11         Systemic VTI:  0.24 m                             Systemic Diam: 2.00 cm Skeet Latch MD Electronically signed by Skeet Latch MD Signature Date/Time: 11/20/2020/11:36:18 AM    Final      Labs:   Basic Metabolic Panel: Recent Labs  Lab 11/27/20 0025 11/28/20 0549 11/29/20 0141 12/02/20 0033  NA 130* 133* 133* 132*  K  4.2 4.5 4.1 4.3  CL 96* 98 97* 94*  CO2 27 25 27 28   GLUCOSE 100* 137* 82 111*  BUN 45* 29* 23 53*  CREATININE 7.88* 6.42* 5.19* 8.37*  CALCIUM 8.3* 8.9 8.9 8.4*  MG  --   --  2.0 2.3   GFR Estimated Creatinine Clearance: 6.6 mL/min (A) (by C-G formula based on SCr of 8.37 mg/dL (H)). Liver Function Tests: Recent Labs  Lab 11/28/20 0549  AST 44*  ALT 35  ALKPHOS 142*  BILITOT 0.6  PROT 6.5  ALBUMIN 2.6*   Recent Labs  Lab 11/28/20 0549  LIPASE 72*   No results for input(s): AMMONIA in the last 168 hours. Coagulation profile No results for input(s): INR, PROTIME in the last 168 hours.  CBC: Recent Labs  Lab 11/27/20 0025 11/28/20 0032 11/29/20 0141 11/30/20 0057 12/01/20 0057 12/02/20 0033  WBC 10.4  --  16.9* 15.0*  --  12.1*  HGB 6.3* 6.9* 7.0* 7.1* 7.4* 7.4*  HCT 19.2* 20.3* 21.4* 21.2* 23.0* 22.1*  MCV 95.5  --  97.3 96.8  --  95.3  PLT 133*  --  145* 122*  --  122*   Cardiac Enzymes: No results for input(s): CKTOTAL, CKMB,  CKMBINDEX, TROPONINI in the last 168 hours. BNP: Invalid input(s): POCBNP CBG: Recent Labs  Lab 11/28/20 0519  GLUCAP 116*   D-Dimer No results for input(s): DDIMER in the last 72 hours. Hgb A1c No results for input(s): HGBA1C in the last 72 hours. Lipid Profile No results for input(s): CHOL, HDL, LDLCALC, TRIG, CHOLHDL, LDLDIRECT in the last 72 hours. Thyroid function studies No results for input(s): TSH, T4TOTAL, T3FREE, THYROIDAB in the last 72 hours.  Invalid input(s): FREET3 Anemia work up No results for input(s): VITAMINB12, FOLATE, FERRITIN, TIBC, IRON, RETICCTPCT in the last 72 hours. Microbiology No results found for this or any previous visit (from the past 240 hour(s)).   Discharge Instructions:   Discharge Instructions     Diet - low sodium heart healthy   Complete by: As directed    Discharge instructions   Complete by: As directed    Follow up with your primary care physician in one week.check blood  work at that time.  Continue to take medications as prescribed. After 4 weeks of pantoprazole, ok to go back on prilosec from home. Seek medical attention for ongoing symptoms. Continue dialysis as outpatient.   Face-to-face encounter (required for Medicare/Medicaid patients)   Complete by: As directed    I Jabori Henegar certify that this patient is under my care and that I, or a nurse practitioner or physician's assistant working with me, had a face-to-face encounter that meets the physician face-to-face encounter requirements with this patient on 12/02/2020. The encounter with the patient was in whole, or in part for the following medical condition(s) which is the primary reason for home health care (-debility, CHF, GI bleed, ESRD,   The encounter with the patient was in whole, or in part, for the following medical condition, which is the primary reason for home health care: debility, CHF, GI bleed, ESRD,   I certify that, based on my findings, the following services are medically necessary home health services: Physical therapy   Reason for Medically Necessary Home Health Services: Therapy- Therapeutic Exercises to Increase Strength and Endurance   My clinical findings support the need for the above services:  Unable to leave home safely without assistance and/or assistive device Cognitive impairments, dementia, or mental confusion  that make it unsafe to leave home     Further, I certify that my clinical findings support that this patient is homebound due to: Unable to leave home safely without assistance   Home Health   Complete by: As directed    To provide the following care/treatments: PT   Increase activity slowly   Complete by: As directed    No wound care   Complete by: As directed       Allergies as of 12/03/2020   No Known Allergies      Medication List     STOP taking these medications    amLODipine 10 MG tablet Commonly known as: NORVASC   omeprazole 20 MG  capsule Commonly known as: PRILOSEC       TAKE these medications    ferric citrate 1 GM 210 MG(Fe) tablet Commonly known as: AURYXIA Take 2 tablets (420 mg total) by mouth 2 (two) times daily with a meal.   folic acid 1 MG tablet Commonly known as: FOLVITE Take 1 tablet (1 mg total) by mouth daily.   ketorolac 0.4 % Soln Commonly known as: ACULAR Place 1 drop into the left eye 4 (four) times daily.   multivitamin Tabs tablet Take 1 tablet  by mouth daily.   ofloxacin 0.3 % ophthalmic solution Commonly known as: OCUFLOX Place 1 drop into the left eye 4 (four) times daily.   ondansetron 4 MG tablet Commonly known as: ZOFRAN Take 1 tablet (4 mg total) by mouth every 6 (six) hours as needed for nausea or vomiting.   pantoprazole 40 MG tablet Commonly known as: PROTONIX Take 1 tablet (40 mg total) by mouth daily for 28 days.   prednisoLONE acetate 1 % ophthalmic suspension Commonly known as: PRED FORTE Place 1 drop into the left eye 4 (four) times daily.   Simbrinza 1-0.2 % Susp Generic drug: Brinzolamide-Brimonidine Place 1 drop into the left eye 2 (two) times daily.   torsemide 20 MG tablet Commonly known as: DEMADEX Take 20 mg by mouth daily.               Durable Medical Equipment  (From admission, onward)           Start     Ordered   11/26/20 1555  For home use only DME Walker rolling  Once       Question Answer Comment  Walker: With 5 Inch Wheels   Patient needs a walker to treat with the following condition Weakness      11/26/20 1554            Follow-up [facility] Follow up.   Specialty: Home Health Services Why: [facility] Contact information: [place] [address] [phone]         [facility] Follow up.   Why: rolling walker Contact information: [place] [address] [phone]         [facility] Follow up.   Why:  outpatient palliative services Contact information: [place] [address] [phone]        Loel Dubonnet, NP Follow up on 12/30/2020.   Specialty: Cardiology Why: at 3 pm,  this is Dr. Marlou Porch Nurse Practitioner, sorry about different location but our appointments are tight.   This is one of our new offices Contact information: [place] [address] [phone]                  Time coordinating discharge: 39 minutes  Signed:  Jahmil Macleod  [facility] 12/03/2020, 2:59 PM

## 2020-12-03 NOTE — Plan of Care (Signed)

## 2020-12-05 ENCOUNTER — Other Ambulatory Visit: Payer: Self-pay | Admitting: Gastroenterology

## 2020-12-05 NOTE — TOC Transition Note (Signed)
Transition of care contact from inpatient facility  Date of discharge: 12/03/20 Date of contact: 12/05/20 Method: Phone Spoke to: Patient and Patient's Daughter  Patient contacted to discuss transition of care from recent inpatient hospitalization. Patient was admitted to [facility] from 8/16-8/30/22 with discharge diagnosis of volume overload and shortness of breath.  Medication changes were reviewed.  Patient will follow up with his/her outpatient HD unit on: 12/06/20 at [facility].  Tobie Poet, NP

## 2020-12-06 ENCOUNTER — Telehealth: Payer: Self-pay

## 2020-12-06 NOTE — Telephone Encounter (Signed)
Spoke with patient's daughter [name] and scheduled an in-person Palliative Consult for 01/16/21 @ 12:30PM. Patient has dialysis Mon, Wed, and Fri  COVID screening was negative. No pets in home. Patient lives with daughter and SIL.  Consent obtained; updated Outlook/Netsmart/Team List and Epic.   Family is aware they may be receiving a call from provider the day before or day of to confirm appointment.

## 2020-12-30 ENCOUNTER — Ambulatory Visit (HOSPITAL_BASED_OUTPATIENT_CLINIC_OR_DEPARTMENT_OTHER): Payer: Medicare Other | Admitting: Family

## 2021-01-07 ENCOUNTER — Telehealth: Payer: Self-pay | Admitting: Gastroenterology

## 2021-01-07 NOTE — Telephone Encounter (Signed)
Inbound call from patient daughter, [name]. States patient is having some problems and would like a coon appt. His current appt is 10/18. Should would like a call back

## 2021-01-08 NOTE — Telephone Encounter (Signed)
Thank you Dr. Candis Schatz. I spoke with [name] this morning to cancel appointment scheduled 10/18 with you. She will have [facility] handle father care.

## 2021-01-15 ENCOUNTER — Other Ambulatory Visit: Payer: Self-pay | Admitting: Physician Assistant

## 2021-01-16 ENCOUNTER — Other Ambulatory Visit: Payer: Medicare Other | Admitting: Hospice

## 2021-01-16 ENCOUNTER — Other Ambulatory Visit: Payer: Self-pay | Admitting: Physician Assistant

## 2021-01-16 ENCOUNTER — Other Ambulatory Visit: Payer: Self-pay

## 2021-01-16 DIAGNOSIS — Z515 Encounter for palliative care: Secondary | ICD-10-CM

## 2021-01-16 DIAGNOSIS — B1920 Unspecified viral hepatitis C without hepatic coma: Secondary | ICD-10-CM

## 2021-01-16 DIAGNOSIS — K5901 Slow transit constipation: Secondary | ICD-10-CM

## 2021-01-16 DIAGNOSIS — I5043 Acute on chronic combined systolic (congestive) and diastolic (congestive) heart failure: Secondary | ICD-10-CM

## 2021-01-16 DIAGNOSIS — N186 End stage renal disease: Secondary | ICD-10-CM

## 2021-01-16 NOTE — Progress Notes (Signed)
Therapist, nutritional Palliative Care Consult Note Telephone: [phone]  Fax: [phone]  PATIENT NAME: [name] Colon [address] [place] [address] [phone] (home)  DOB: [birth year] MRN: [id number]  PRIMARY CARE PROVIDER:    Mick Sell, MD,  [address] [place] [address] [phone]  REFERRING PROVIDER:   Mick Sell, MD [address] [place],  [place] [address] [phone]  RESPONSIBLE PARTY:  Self/[name]/[name] Colon is the [facility] Information     Name Relation Home Work Mobile   Colon,[name] Daughter   [phone]   Donnetta Hail   [phone]        I met face to face with patient and family at home. Palliative Care was asked to follow this patient by consultation request of  Mick Sell, MD to address advance care planning, complex medical decision making and goals of care clarification. [name] Colon and [name] Colon are home with patient during visit. This is the initial visit.    ASSESSMENT AND / RECOMMENDATIONS:   Advance Care Planning: Our advance care planning conversation included a discussion about:    The value and importance of advance care planning  Difference between Hospice and Palliative care Exploration of goals of care in the event of a sudden injury or illness  Identification and preparation of a healthcare agent  Review and updating or creation of an  advance directive document . Decision not to resuscitate or to de-escalate disease focused treatments due to poor prognosis.  CODE STATUS: Discussion on the ramifications and implications of Code status. Patient affirmed he is a Full code  Goals of Care: Goals include to maximize quality of life and symptom management. Patient is a Jehovah Witness and would not want blood transfusion or blood products due to his religious reasons.   I spent 46 minutes providing this initial consultation. More than 50% of  the time in this consultation was spent on counseling patient and coordinating communication. --------------------------------------------------------------------------------------------------------------------------------------  Symptom Management/Plan: ESRD: Continue dialysis Mon Wed Friday. Adhere to 1L fluid restriction, reduced salt. Continue Theme park manager.  Weakness: PT/OT is ongoing for strengthening and gait training. Patient reports appetite is improving; Continue  Nepro nutritional drink; ensure adequate oral intake.  Falls precautions discussed. Constipation: Managed with Miralax CHF: Continue torsemide as ordered, adhere to reduced salt, IL fluid restriction.  Balance of rest and performance activity. Routine CBC BMP. Follow up: Palliative care will continue to follow for complex medical decision making, advance care planning, and clarification of goals. Return 6 weeks or prn.Encouraged to call provider sooner with any concerns.   Family /Caregiver/Community Supports: Patient lives with her daughter [name] Colon and her husband [name] Colon. Strong family support system identified  HOSPICE ELIGIBILITY/DIAGNOSIS: TBD  Chief Complaint: Initial Palliative care visit  HISTORY OF PRESENT ILLNESS:  [name] Colon is a 80 y.o. 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  He recently missed a dialysis session and that threw him into fluid overload, worsened with respiratory distress especially during activity.  Dialysis is helpful. History of hep C with cirrhosis, chronic combined systolic and diastolic CHF, constipation, hypertension.  Patient denied pain/discomfort, no respiratory distress during visit; endorses occasional respiratory distress during exercise.  He endorsed weakness for which he is getting PT OT. History obtained from review of EMR, discussion with primary team, caregiver, family and/or Mr.  Colon.  Review and summarization of Epic records shows history from other than  patient. Rest of 10 point ROS asked and negative.     Review of lab tests/diagnostics   Results for [name], Colon (MRN [id number]) as of 01/16/2021 13:16  Ref. Range 12/02/2020 00:33  Sodium Latest Ref Range: 135 - 145 mmol/L 132 (L)  Potassium Latest Ref Range: 3.5 - 5.1 mmol/L 4.3  Chloride Latest Ref Range: 98 - 111 mmol/L 94 (L)  CO2 Latest Ref Range: 22 - 32 mmol/L 28  Glucose Latest Ref Range: 70 - 99 mg/dL 111 (H)  BUN Latest Ref Range: 8 - 23 mg/dL 53 (H)  Creatinine Latest Ref Range: 0.61 - 1.24 mg/dL 8.37 (H)  Calcium Latest Ref Range: 8.9 - 10.3 mg/dL 8.4 (L)  Anion gap Latest Ref Range: 5 - 15  10  Magnesium Latest Ref Range: 1.7 - 2.4 mg/dL 2.3  GFR, Estimated Latest Ref Range: >60 mL/min 6 (L)  WBC Latest Ref Range: 4.0 - 10.5 K/uL 12.1 (H)  RBC Latest Ref Range: 4.22 - 5.81 MIL/uL 2.32 (L)  Hemoglobin Latest Ref Range: 13.0 - 17.0 g/dL 7.4 (L)  HCT Latest Ref Range: 39.0 - 52.0 % 22.1 (L)  MCV Latest Ref Range: 80.0 - 100.0 fL 95.3  MCH Latest Ref Range: 26.0 - 34.0 pg 31.9  MCHC Latest Ref Range: 30.0 - 36.0 g/dL 33.5  RDW Latest Ref Range: 11.5 - 15.5 % 18.6 (H)  Platelets Latest Ref Range: 150 - 400 K/uL 122 (L)     ROS General: NAD EYES: denies vision changes ENMT: denies dysphagia Cardiovascular: denies chest pain/discomfort Pulmonary: endorses SOB on moderate exertion, occasional cough with clear phlegm Abdomen: endorses good appetite, denies constipation/diarrhea GU: denies dysuria, urinary frequency MSK:  endorses weakness,  no falls reported Skin: denies rashes or wounds Neurological: denies pain, denies insomnia Psych: Endorses positive mood Heme/lymph/immuno: denies bruises, abnormal bleeding  Physical Exam: Height/Weight: 5 feet 8 inches/144 Ibs 02 95% RA R 18 P79 Constitutional: NAD General: Well groomed, cooperative EYES: anicteric sclera, lids intact, no  discharge  ENMT: Moist mucous membrane CV: S1 S2, RRR, no LE edema Pulmonary: LCTA, no increased work of breathing, no cough during visit Abdomen: active BS + 4 quadrants, soft and non tender GU: no suprapubic tenderness MSK: weakness, sarcopenia, limited ROM Skin: warm and dry, no rashes or wounds on visible skin Neuro:  weakness, otherwise non focal Psych: non-anxious affect Hem/lymph/immuno: no widespread bruising   PAST MEDICAL HISTORY:  Active Ambulatory Problems    Diagnosis Date Noted   Volume overload 11/20/2020   ESRD (end stage renal disease) on dialysis ([place]) 11/20/2020   Colon cancer ([place]) 11/20/2020   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[place]) 11/20/2020   Hypertension 11/20/2020   Vasovagal episode 11/20/2020   Hematemesis 11/20/2020   Acute on chronic combined systolic and diastolic CHF (congestive heart failure) ([facility]) 11/20/2020   Acute pulmonary edema ([facility])    Mallory-Weiss tear    Resolved Ambulatory Problems    Diagnosis Date Noted   No Resolved Ambulatory Problems   No Additional Past Medical History    SOCIAL HX:  Social History   Tobacco Use   Smoking status: Former    Types: Cigarettes    Quit date: 1985    Years since quitting: 37.8   Smokeless tobacco: Never  Substance Use Topics   Alcohol use: Not Currently     FAMILY HX:  Father: Hypertension Brother: CVA Sister: Hypertension  ALLERGIES: No Known Allergies    PERTINENT MEDICATIONS:  Outpatient Encounter Medications as of 01/16/2021  Medication Sig   Brinzolamide-Brimonidine (SIMBRINZA) 1-0.2 % SUSP Place 1 drop into the left eye 2 (two) times daily.   ferric citrate (AURYXIA) 1 GM 210 MG(Fe) tablet Take 2 tablets (420 mg total) by mouth 2 (two) times daily with a meal.   folic acid (FOLVITE) 1 MG tablet Take 1 tablet (1 mg total) by mouth daily.   ketorolac (ACULAR) 0.4 % SOLN Place 1 drop into the left eye 4 (four) times daily.   multivitamin (RENA-VIT) TABS tablet Take 1 tablet  by mouth daily.   ofloxacin (OCUFLOX) 0.3 % ophthalmic solution Place 1 drop into the left eye 4 (four) times daily.   ondansetron (ZOFRAN) 4 MG tablet Take 1 tablet (4 mg total) by mouth every 6 (six) hours as needed for nausea or vomiting.   pantoprazole (PROTONIX) 40 MG tablet TAKE 1 TABLET(40 MG) BY MOUTH DAILY   prednisoLONE acetate (PRED FORTE) 1 % ophthalmic suspension Place 1 drop into the left eye 4 (four) times daily.   torsemide (DEMADEX) 20 MG tablet Take 20 mg by mouth daily.   No facility-administered encounter medications on file as of 01/16/2021.      Thank you for the opportunity to participate in the care of Mr. Litzinger.  The palliative care team will continue to follow. Please call our office at [phone] if we can be of additional assistance.   Note: Portions of this note were generated with Lobbyist. Dictation errors may occur despite best attempts at proofreading.  Teodoro Spray, NP

## 2021-01-21 ENCOUNTER — Ambulatory Visit: Payer: Medicare Other | Admitting: Gastroenterology

## 2021-01-30 ENCOUNTER — Other Ambulatory Visit: Payer: Self-pay

## 2021-01-30 ENCOUNTER — Ambulatory Visit
Admission: RE | Admit: 2021-01-30 | Discharge: 2021-01-30 | Disposition: A | Discharge status: Home / Self Care | Payer: Medicare Other | Source: Ambulatory Visit | Attending: Physician Assistant | Admitting: Physician Assistant

## 2021-01-30 DIAGNOSIS — B1920 Unspecified viral hepatitis C without hepatic coma: Secondary | ICD-10-CM

## 2021-02-04 ENCOUNTER — Telehealth: Payer: Self-pay

## 2021-02-04 NOTE — Telephone Encounter (Signed)
Returned call to daughter [name] regarding patient need for O2. [name] reports PT from [facility] was at home today and can not continue to do PT without O2.  RN called [facility] and spoke with Clinical manager who reports they have already spoken with MD of record and they were going to place O2 order for patient with home health documentation. Notified Daughter of same. Reported to palliative care team

## 2021-02-05 ENCOUNTER — Encounter: Payer: Self-pay | Admitting: Emergency Medicine

## 2021-02-05 ENCOUNTER — Emergency Department: Payer: Medicare Other

## 2021-02-05 ENCOUNTER — Other Ambulatory Visit: Payer: Self-pay

## 2021-02-05 DIAGNOSIS — R059 Cough, unspecified: Secondary | ICD-10-CM | POA: Diagnosis not present

## 2021-02-05 DIAGNOSIS — R0602 Shortness of breath: Secondary | ICD-10-CM | POA: Diagnosis present

## 2021-02-05 DIAGNOSIS — Z5321 Procedure and treatment not carried out due to patient leaving prior to being seen by health care provider: Secondary | ICD-10-CM | POA: Insufficient documentation

## 2021-02-05 LAB — BASIC METABOLIC PANEL
Anion gap: 10 (ref 5–15)
BUN: 18 mg/dL (ref 8–23)
CO2: 31 mmol/L (ref 22–32)
Calcium: 9.4 mg/dL (ref 8.9–10.3)
Chloride: 96 mmol/L — ABNORMAL LOW (ref 98–111)
Creatinine, Ser: 3.65 mg/dL — ABNORMAL HIGH (ref 0.61–1.24)
GFR, Estimated: 16 mL/min — ABNORMAL LOW (ref >60)
Glucose, Bld: 110 mg/dL — ABNORMAL HIGH (ref 70–99)
Potassium: 4.2 mmol/L (ref 3.5–5.1)
Sodium: 137 mmol/L (ref 135–145)

## 2021-02-05 LAB — CBC
HCT: 32.9 % — ABNORMAL LOW (ref 39.0–52.0)
Hemoglobin: 10.9 g/dL — ABNORMAL LOW (ref 13.0–17.0)
MCH: 31 pg (ref 26.0–34.0)
MCHC: 33.1 g/dL (ref 30.0–36.0)
MCV: 93.5 fL (ref 80.0–100.0)
Platelets: 141 10*3/uL — ABNORMAL LOW (ref 150–400)
RBC: 3.52 MIL/uL — ABNORMAL LOW (ref 4.22–5.81)
RDW: 15.9 % — ABNORMAL HIGH (ref 11.5–15.5)
WBC: 6.9 10*3/uL (ref 4.0–10.5)
nRBC: 0 % (ref 0.0–0.2)

## 2021-02-05 LAB — TROPONIN I (HIGH SENSITIVITY): Troponin I (High Sensitivity): 45 ng/L — ABNORMAL HIGH (ref <18)

## 2021-02-05 NOTE — ED Provider Notes (Signed)
Emergency Medicine Provider Triage Evaluation Note  [name] Colon , a 80 y.o. male  was evaluated in triage.  Pt complains of shortness of breath since last night.  Has a history of cardiac issues.  Denies any current chest pain or palpitations.  Shortness of breath is with exertion.  He denies any fevers.  He has had a dry cough for several days  Review of Systems  Positive: Cough, shortness of breath Negative: Chest pain, fevers  Physical Exam  BP (!) 111/94   Pulse 84   Temp 97.9 F (36.6 C)   Resp 20   Ht 5\' 8"  (1.727 m)   Wt 63.5 kg   SpO2 100%   BMI 21.29 kg/m  Gen:   Awake, no distress   Resp:  Normal effort no respiratory distress MSK:   Moves extremities without difficulty  Other:    Medical Decision Making  Medically screening exam initiated at 6:27 PM.  Appropriate orders placed.  [name] Colon was informed that the remainder of the evaluation will be completed by another provider, this initial triage assessment does not replace that evaluation, and the importance of remaining in the ED until their evaluation is complete.  80 year old male with cough, shortness of breath.  We will order chest x-ray, blood work, EKG along with cardiac labs.   Duanne Guess, PA-C 02/05/21 Maryelizabeth Rowan, MD 02/06/21 [phone]

## 2021-02-05 NOTE — ED Triage Notes (Signed)
Pt to ED via POV with c/o SHOB and dry cough for the last few days. He is a dialysis pt. He gets more [facility] when he moves around lately. He is able to speak in complete sentences.

## 2021-02-06 ENCOUNTER — Emergency Department
Admission: EM | Admit: 2021-02-06 | Discharge: 2021-02-06 | Disposition: A | Discharge status: Home / Self Care | Payer: Medicare Other | Attending: Emergency Medicine | Admitting: Emergency Medicine

## 2021-02-06 LAB — BRAIN NATRIURETIC PEPTIDE: B Natriuretic Peptide: >4500 pg/mL — ABNORMAL HIGH (ref 0.0–100.0)

## 2021-02-06 NOTE — ED Notes (Signed)
No answer when called several times from lobby 

## 2021-02-13 ENCOUNTER — Other Ambulatory Visit: Payer: Medicare Other | Admitting: Hospice

## 2021-02-13 ENCOUNTER — Other Ambulatory Visit: Payer: Self-pay

## 2021-02-13 DIAGNOSIS — Z992 Dependence on renal dialysis: Secondary | ICD-10-CM

## 2021-02-13 DIAGNOSIS — K5901 Slow transit constipation: Secondary | ICD-10-CM

## 2021-02-13 DIAGNOSIS — Z515 Encounter for palliative care: Secondary | ICD-10-CM

## 2021-02-13 DIAGNOSIS — J449 Chronic obstructive pulmonary disease, unspecified: Secondary | ICD-10-CM

## 2021-02-13 NOTE — Progress Notes (Signed)
Designer, jewellery Palliative Care Consult Note Telephone: [phone]  Fax: [phone]  PATIENT NAME: [name] Colon DOB: [birth year] MRN: [id number]  PRIMARY CARE PROVIDER:   Leonel Ramsay, MD [name] Ramsay, MD [place],  [place] [address]  REFERRING PROVIDER: Leonel Ramsay, MD [name] Ramsay, MD [place],  [place] [address]  RESPONSIBLE PARTY:  Self/[name]/[name] Contact Information     Name Relation Home Work Mobile   [name] Colon Daughter   [phone]   [name] Colon   [phone]       Visit is to build trust and highlight Palliative Medicine as specialized medical care for people living with serious illness, aimed at facilitating better quality of life through symptoms relief, assisting with advance care planning and complex medical decision making.  [name] Colon and her spouse are present with patient during visit.  This is a follow up visit.  RECOMMENDATIONS/PLAN:   Advance Care Planning/Code Status: Patient is a Full code.   Goals of Care: Goals of care include to maximize quality of life and symptom management.  Visit consisted of counseling and education dealing with the complex and emotionally intense issues of symptom management and palliative care in the setting of serious and potentially life-threatening illness.  Their Jehovah witness faith gives them coverage and hope.  Palliative care team will continue to support patient, patient's family, and medical team.  Symptom management/Plan:  COPD: New.  Avoid triggers. Use inhalers as ordered.  Slow deep breathing encouraged. Seen in the ED for SOB 02/06/2021, saw Pulmonologist 02/07/2021. Chest Xray negative. Continue with oxygen supplementation 2L/Min. follow-up with pulmonologist scheduled for 03/04/2021 ESRD: Continue dialysis Mon Wed Friday. Adhere to 1L fluid restriction, reduced salt. Continue Theme park manager.   Weakness: PT/OT is ongoing for strengthening and gait training.  Patient reports appetite is improving; Continue  Nepro nutritional drink; ensure adequate oral intake.  Falls precautions discussed. Balance of rest and performance activity. Constipation: Managed with Miralax CHF: Continue torsemide as ordered, adhere to reduced salt, IL fluid restriction.  Appointment with PCP 02/18/2021.  Routine CBC BMP  Follow up: Palliative care will continue to follow for complex medical decision making, advance care planning, and clarification of goals. Return 6 weeks or prn.Encouraged to call provider sooner with any concerns.    Family /Caregiver/Community Supports: Patient lives with her daughter [name] Colon and her husband [name] Colon. Strong family support system identified. [employer] will commence nursing aide service from next week 3 times a week.    HOSPICE ELIGIBILITY/DIAGNOSIS: TBD   Chief Complaint: Initial Palliative care visit   HISTORY OF PRESENT ILLNESS:  [name] Colon is a 80 y.o. year old male  with multiple medical conditions including COPD, mild, acute/newly diagnosed at [facility] last week, with associated shortness of breath. Shortness of breath is worse on exertion and with activity, impairing activities of daily living and quality of life.  Patient is now on oxygen supplementation 2L/Min and it is helpful. 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 History of hep C with cirrhosis, chronic combined systolic and diastolic CHF, constipation, hypertension.  Patient denied pain/discomfort, endorses occasional respiratory distress during exercise/ambulation.  He endorsed weakness for which he is getting PT OT. History obtained from review of EMR, discussion with primary team, family and/or patient. Records reviewed and summarized above. All 10 point systems reviewed and are negative except as documented in history of present illness above  Review  and  summarization of Epic records shows history from other than patient.   Palliative Care was asked to follow this patient o help address complex decision making in the context of advance care planning and goals of care clarification. I reviewed, as needed, available labs, patient records, imaging, studies and related documents from the EMR.    PHYSICAL EXAM  Height/Weight: 5 feet 8 inches/140 Ibs down from 144 Ibs last month. 02 95% RA R 18 P79 Constitutional: NAD General: Well groomed, cooperative EYES: anicteric sclera, lids intact, no discharge  ENMT: Moist mucous membrane CV: S1 S2, RRR, no LE edema Pulmonary: LCTA, no increased work of breathing, no cough during visit Abdomen: active BS + 4 quadrants, soft and non tender GU: no suprapubic tenderness MSK: weakness, sarcopenia, limited ROM Skin: warm and dry, no rashes or wounds on visible skin Neuro:  weakness, otherwise non focal Psych: non-anxious affect Hem/lymph/immuno: no widespread bruising   PERTINENT MEDICATIONS:  Outpatient Encounter Medications as of 02/13/2021  Medication Sig   Brinzolamide-Brimonidine (SIMBRINZA) 1-0.2 % SUSP Place 1 drop into the left eye 2 (two) times daily.   ferric citrate (AURYXIA) 1 GM 210 MG(Fe) tablet Take 2 tablets (420 mg total) by mouth 2 (two) times daily with a meal.   folic acid (FOLVITE) 1 MG tablet Take 1 tablet (1 mg total) by mouth daily.   ketorolac (ACULAR) 0.4 % SOLN Place 1 drop into the left eye 4 (four) times daily.   multivitamin (RENA-VIT) TABS tablet Take 1 tablet by mouth daily.   ofloxacin (OCUFLOX) 0.3 % ophthalmic solution Place 1 drop into the left eye 4 (four) times daily.   ondansetron (ZOFRAN) 4 MG tablet Take 1 tablet (4 mg total) by mouth every 6 (six) hours as needed for nausea or vomiting.   pantoprazole (PROTONIX) 40 MG tablet TAKE 1 TABLET(40 MG) BY MOUTH DAILY   prednisoLONE acetate (PRED FORTE) 1 % ophthalmic suspension Place 1 drop into the left eye 4 (four)  times daily.   torsemide (DEMADEX) 20 MG tablet Take 20 mg by mouth daily.   No facility-administered encounter medications on file as of 02/13/2021.    HOSPICE ELIGIBILITY/DIAGNOSIS: TBD  PAST MEDICAL HISTORY:  Past Medical History:  Diagnosis Date   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[place])    Colon cancer ([facility])    ESRD (end stage renal disease) on dialysis ([facility] [facility])    Hypertension       ALLERGIES: No Known Allergies    Thank you for the opportunity to participate in the care of Coleton Woon Please call our office at [phone] if we can be of additional assistance.  Note: Portions of this note were generated with Lobbyist. Dictation errors may occur despite best attempts at proofreading.  Teodoro Spray, NP

## 2021-03-31 ENCOUNTER — Other Ambulatory Visit: Payer: Self-pay

## 2021-03-31 ENCOUNTER — Inpatient Hospital Stay (HOSPITAL_COMMUNITY)
Admission: EM | Admit: 2021-03-31 | Discharge: 2021-04-03 | DRG: 640 | Disposition: A | Discharge status: Hospice | Payer: Medicare Other | Source: Skilled Nursing Facility | Attending: Internal Medicine | Admitting: Internal Medicine

## 2021-03-31 ENCOUNTER — Emergency Department (HOSPITAL_COMMUNITY): Payer: Medicare Other

## 2021-03-31 ENCOUNTER — Encounter (HOSPITAL_COMMUNITY): Payer: Self-pay | Admitting: *Deleted

## 2021-03-31 DIAGNOSIS — E8770 Fluid overload, unspecified: Secondary | ICD-10-CM | POA: Diagnosis not present

## 2021-03-31 DIAGNOSIS — Z87891 Personal history of nicotine dependence: Secondary | ICD-10-CM

## 2021-03-31 DIAGNOSIS — J449 Chronic obstructive pulmonary disease, unspecified: Secondary | ICD-10-CM | POA: Diagnosis present

## 2021-03-31 DIAGNOSIS — R0602 Shortness of breath: Secondary | ICD-10-CM | POA: Diagnosis not present

## 2021-03-31 DIAGNOSIS — N289 Disorder of kidney and ureter, unspecified: Secondary | ICD-10-CM

## 2021-03-31 DIAGNOSIS — D631 Anemia in chronic kidney disease: Secondary | ICD-10-CM | POA: Diagnosis present

## 2021-03-31 DIAGNOSIS — IMO0001 Reserved for inherently not codable concepts without codable children: Secondary | ICD-10-CM

## 2021-03-31 DIAGNOSIS — Z992 Dependence on renal dialysis: Secondary | ICD-10-CM

## 2021-03-31 DIAGNOSIS — M898X9 Other specified disorders of bone, unspecified site: Secondary | ICD-10-CM | POA: Diagnosis present

## 2021-03-31 DIAGNOSIS — E8779 Other fluid overload: Secondary | ICD-10-CM | POA: Diagnosis not present

## 2021-03-31 DIAGNOSIS — Z515 Encounter for palliative care: Secondary | ICD-10-CM

## 2021-03-31 DIAGNOSIS — Z20822 Contact with and (suspected) exposure to covid-19: Secondary | ICD-10-CM | POA: Diagnosis present

## 2021-03-31 DIAGNOSIS — I132 Hypertensive heart and chronic kidney disease with heart failure and with stage 5 chronic kidney disease, or end stage renal disease: Secondary | ICD-10-CM | POA: Diagnosis present

## 2021-03-31 DIAGNOSIS — C189 Malignant neoplasm of colon, unspecified: Secondary | ICD-10-CM | POA: Diagnosis present

## 2021-03-31 DIAGNOSIS — Z79899 Other long term (current) drug therapy: Secondary | ICD-10-CM

## 2021-03-31 DIAGNOSIS — I10 Essential (primary) hypertension: Secondary | ICD-10-CM | POA: Diagnosis present

## 2021-03-31 DIAGNOSIS — N186 End stage renal disease: Secondary | ICD-10-CM

## 2021-03-31 DIAGNOSIS — R627 Adult failure to thrive: Secondary | ICD-10-CM | POA: Diagnosis present

## 2021-03-31 DIAGNOSIS — I5022 Chronic systolic (congestive) heart failure: Secondary | ICD-10-CM

## 2021-03-31 DIAGNOSIS — K7460 Unspecified cirrhosis of liver: Secondary | ICD-10-CM | POA: Diagnosis present

## 2021-03-31 DIAGNOSIS — B182 Chronic viral hepatitis C: Secondary | ICD-10-CM | POA: Diagnosis present

## 2021-03-31 DIAGNOSIS — Z531 Procedure and treatment not carried out because of patient's decision for reasons of belief and group pressure: Secondary | ICD-10-CM | POA: Diagnosis present

## 2021-03-31 DIAGNOSIS — Z66 Do not resuscitate: Secondary | ICD-10-CM | POA: Diagnosis present

## 2021-03-31 HISTORY — DX: Chronic systolic (congestive) heart failure: I50.22

## 2021-03-31 HISTORY — DX: Procedure and treatment not carried out because of patient's decision for reasons of belief and group pressure: Z53.1

## 2021-03-31 LAB — CBC WITH DIFFERENTIAL/PLATELET
Abs Immature Granulocytes: 0.01 10*3/uL (ref 0.00–0.07)
Basophils Absolute: 0 10*3/uL (ref 0.0–0.1)
Basophils Relative: 1 %
Eosinophils Absolute: 0 10*3/uL (ref 0.0–0.5)
Eosinophils Relative: 1 %
HCT: 34.7 % — ABNORMAL LOW (ref 39.0–52.0)
Hemoglobin: 11.4 g/dL — ABNORMAL LOW (ref 13.0–17.0)
Immature Granulocytes: 0 %
Lymphocytes Relative: 26 %
Lymphs Abs: 1.6 10*3/uL (ref 0.7–4.0)
MCH: 31.5 pg (ref 26.0–34.0)
MCHC: 32.9 g/dL (ref 30.0–36.0)
MCV: 95.9 fL (ref 80.0–100.0)
Monocytes Absolute: 0.5 10*3/uL (ref 0.1–1.0)
Monocytes Relative: 8 %
Neutro Abs: 3.9 10*3/uL (ref 1.7–7.7)
Neutrophils Relative %: 64 %
Platelets: 97 10*3/uL — ABNORMAL LOW (ref 150–400)
RBC: 3.62 MIL/uL — ABNORMAL LOW (ref 4.22–5.81)
RDW: 17.4 % — ABNORMAL HIGH (ref 11.5–15.5)
WBC: 6.1 10*3/uL (ref 4.0–10.5)
nRBC: 0 % (ref 0.0–0.2)

## 2021-03-31 LAB — BRAIN NATRIURETIC PEPTIDE: B Natriuretic Peptide: >4500 pg/mL — ABNORMAL HIGH (ref 0.0–100.0)

## 2021-03-31 LAB — COMPREHENSIVE METABOLIC PANEL
ALT: 40 U/L (ref 0–44)
AST: 28 U/L (ref 15–41)
Albumin: 2.9 g/dL — ABNORMAL LOW (ref 3.5–5.0)
Alkaline Phosphatase: 113 U/L (ref 38–126)
Anion gap: 13 (ref 5–15)
BUN: 59 mg/dL — ABNORMAL HIGH (ref 8–23)
CO2: 27 mmol/L (ref 22–32)
Calcium: 9.6 mg/dL (ref 8.9–10.3)
Chloride: 97 mmol/L — ABNORMAL LOW (ref 98–111)
Creatinine, Ser: 9.54 mg/dL — ABNORMAL HIGH (ref 0.61–1.24)
GFR, Estimated: 5 mL/min — ABNORMAL LOW (ref >60)
Glucose, Bld: 111 mg/dL — ABNORMAL HIGH (ref 70–99)
Potassium: 5.1 mmol/L (ref 3.5–5.1)
Sodium: 137 mmol/L (ref 135–145)
Total Bilirubin: 1.3 mg/dL — ABNORMAL HIGH (ref 0.3–1.2)
Total Protein: 7 g/dL (ref 6.5–8.1)

## 2021-03-31 LAB — RESP PANEL BY RT-PCR (FLU A&B, COVID) ARPGX2
Influenza A by PCR: NEGATIVE
Influenza B by PCR: NEGATIVE
SARS Coronavirus 2 by RT PCR: NEGATIVE

## 2021-03-31 LAB — LIPASE, BLOOD: Lipase: 53 U/L — ABNORMAL HIGH (ref 11–51)

## 2021-03-31 LAB — TROPONIN I (HIGH SENSITIVITY)
Troponin I (High Sensitivity): 49 ng/L — ABNORMAL HIGH (ref <18)
Troponin I (High Sensitivity): 48 ng/L — ABNORMAL HIGH (ref <18)

## 2021-03-31 LAB — POC OCCULT BLOOD, ED: Fecal Occult Bld: NEGATIVE

## 2021-03-31 MED ORDER — ZOLPIDEM TARTRATE 5 MG PO TABS: 5.0000 mg | ORAL_TABLET | Freq: Every evening | ORAL | Status: DC | PRN

## 2021-03-31 MED ORDER — HYDRALAZINE HCL 20 MG/ML IJ SOLN: 5.0000 mg | INTRAMUSCULAR | Status: DC | PRN

## 2021-03-31 MED ORDER — ONDANSETRON HCL 4 MG/2ML IJ SOLN: 4.0000 mg | Freq: Four times a day (QID) | INTRAMUSCULAR | Status: DC | PRN

## 2021-03-31 MED ORDER — NEPRO/CARBSTEADY PO LIQD: 237.0000 mL | Freq: Three times a day (TID) | ORAL | Status: DC | PRN

## 2021-03-31 MED ORDER — ACETAMINOPHEN 650 MG RE SUPP: 650.0000 mg | Freq: Four times a day (QID) | RECTAL | Status: DC | PRN

## 2021-03-31 MED ORDER — SORBITOL 70 % SOLN: 30.0000 mL | Status: DC | PRN

## 2021-03-31 MED ORDER — LORAZEPAM 2 MG/ML IJ SOLN: 1.0000 mg | Freq: Once | INTRAMUSCULAR | Status: DC

## 2021-03-31 MED ORDER — CHLORHEXIDINE GLUCONATE CLOTH 2 % EX PADS
6.0000 | MEDICATED_PAD | Freq: Every day | CUTANEOUS | Status: DC
  Administered 2021-04-01: 6 via TOPICAL

## 2021-03-31 MED ORDER — CALCIUM CARBONATE ANTACID 1250 MG/5ML PO SUSP: 500.0000 mg | Freq: Four times a day (QID) | ORAL | Status: DC | PRN

## 2021-03-31 MED ORDER — CAMPHOR-MENTHOL 0.5-0.5 % EX LOTN: 1.0000 "application " | TOPICAL_LOTION | Freq: Three times a day (TID) | CUTANEOUS | Status: DC | PRN

## 2021-03-31 MED ORDER — DOCUSATE SODIUM 283 MG RE ENEM: 1.0000 | ENEMA | RECTAL | Status: DC | PRN

## 2021-03-31 MED ORDER — HEPARIN SODIUM (PORCINE) 5000 UNIT/ML IJ SOLN
5000.0000 [IU] | Freq: Three times a day (TID) | INTRAMUSCULAR | Status: DC
  Administered 2021-03-31 – 2021-04-03 (×7): 5000 [IU] via SUBCUTANEOUS
  Filled 2021-03-31 (×7): qty 1

## 2021-03-31 MED ORDER — TAMSULOSIN HCL 0.4 MG PO CAPS
0.4000 mg | ORAL_CAPSULE | Freq: Every day | ORAL | Status: DC
  Administered 2021-04-01 – 2021-04-03 (×3): 0.4 mg via ORAL
  Filled 2021-03-31 (×3): qty 1

## 2021-03-31 MED ORDER — ONDANSETRON HCL 4 MG PO TABS: 4.0000 mg | ORAL_TABLET | Freq: Four times a day (QID) | ORAL | Status: DC | PRN

## 2021-03-31 MED ORDER — FERRIC CITRATE 1 GM 210 MG(FE) PO TABS
420.0000 mg | ORAL_TABLET | Freq: Two times a day (BID) | ORAL | Status: DC
  Administered 2021-03-31 – 2021-04-03 (×6): 420 mg via ORAL
  Filled 2021-03-31 (×6): qty 2

## 2021-03-31 MED ORDER — TORSEMIDE 20 MG PO TABS
20.0000 mg | ORAL_TABLET | Freq: Every day | ORAL | Status: DC
  Administered 2021-04-01 – 2021-04-03 (×3): 20 mg via ORAL
  Filled 2021-03-31 (×3): qty 1

## 2021-03-31 MED ORDER — FOLIC ACID 1 MG PO TABS
1.0000 mg | ORAL_TABLET | Freq: Every day | ORAL | Status: DC
  Administered 2021-04-01 – 2021-04-03 (×3): 1 mg via ORAL
  Filled 2021-03-31 (×3): qty 1

## 2021-03-31 MED ORDER — HYDROXYZINE HCL 25 MG PO TABS: 25.0000 mg | ORAL_TABLET | Freq: Three times a day (TID) | ORAL | Status: DC | PRN

## 2021-03-31 MED ORDER — LORAZEPAM 2 MG/ML IJ SOLN
INTRAMUSCULAR | Status: AC
  Filled 2021-03-31: qty 1

## 2021-03-31 MED ORDER — ALLOPURINOL 100 MG PO TABS
100.0000 mg | ORAL_TABLET | Freq: Every day | ORAL | Status: DC
  Administered 2021-04-01 – 2021-04-03 (×3): 100 mg via ORAL
  Filled 2021-03-31 (×3): qty 1

## 2021-03-31 MED ORDER — ALLOPURINOL 100 MG PO TABS: 300.0000 mg | ORAL_TABLET | Freq: Every day | ORAL | Status: DC

## 2021-03-31 MED ORDER — ACETAMINOPHEN 325 MG PO TABS: 650.0000 mg | ORAL_TABLET | Freq: Four times a day (QID) | ORAL | Status: DC | PRN

## 2021-03-31 NOTE — Assessment & Plan Note (Signed)
-  Echo in 11/2020 showed EF 45-50% and grade 1 diastolic dysfunction -He may have mild volume overload currently but this is controlled with HD -Continue Demadex

## 2021-03-31 NOTE — ED Notes (Signed)
Consent obtained for dialysis and placed at bedside

## 2021-03-31 NOTE — Assessment & Plan Note (Signed)
-  Hgb appears to be stable at this time so symptomatic anemia does not appear to be the cause of his fatigue/weakness -Regardless, he would not accept blood products

## 2021-03-31 NOTE — Assessment & Plan Note (Signed)
-  Remote, no longer followed due to "cure" in 2014

## 2021-03-31 NOTE — H&P (Signed)
History and Physical    Patient: [name] Colon GNF:[id number] DOB: 1940/05/29 DOA: 03/31/2021 DOS: the patient was seen and examined on 03/31/2021 PCP: Leonel Ramsay, MD  Patient coming from: ALF/ILF - [facility] for respite care until 12/29; NOK: Son, [phone]   Chief Complaint: Lisette Grinder  HPI: Riku Buttery is a 80 y.o. male with medical history significant of ESRD on HD; HTN; chronic hep C with cirrhosis; Jehovah's witness; chronic systolic CHF; and colon cancer presenting with weakness/SOB.  He was last hospitalized from 8/16-30 for CP and missed HD; he required BIPAP and a NTG drip and developed bradycardia requiring CPR.  He got up this AM to go to HD and he just couldn't go.  He felt so weak and bad.  He did not feel well yesterday, about the same.  This has been going on for about 3-4 months, since he has been on HD.  He is miserable on the days he doesn't do HD and lays around all day.  He called his family this AM and felt ok but felt so bad that he called back an hour later and told them he needed to come to the hospital.    ER Course:  Can't get HD today so needs to come in only for HD.  On MWF HD, hasn't missed sessions.  Too SOB/weakness to get up, too lethargic for HD.  Usually wears prn O2 but has been needing continuously.  CXR looks overloaded, BNP high.  Heme negative, Hgb improved from prior.   Review of Systems: ROS reviewed and negative other than as above  Past Medical History:  Diagnosis Date   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HCC)    Chronic systolic (congestive) heart failure (HCC)    Colon cancer (HCC)    ESRD (end stage renal disease) on dialysis ([place])    Hypertension    Refusal of blood transfusions as patient is Jehovah's Witness    Past Surgical History:  Procedure Laterality Date   ESOPHAGOGASTRODUODENOSCOPY (EGD) WITH PROPOFOL N/A 11/22/2020   Procedure: ESOPHAGOGASTRODUODENOSCOPY (EGD) WITH PROPOFOL;  Surgeon: [name], MD;  Location: [facility] ENDOSCOPY;  Service: Gastroenterology;  Laterality: N/A;   HEMOSTASIS CLIP PLACEMENT  11/22/2020   Procedure: HEMOSTASIS CLIP PLACEMENT;  Surgeon: [name], MD;  Location: [facility] ENDOSCOPY;  Service: Gastroenterology;;   Social History:  reports that he quit smoking about 38 years ago. His smoking use included cigarettes. He has never used smokeless tobacco. He reports that he does not currently use alcohol. He reports that he does not currently use drugs.  No Known Allergies  History reviewed. No pertinent family history.  Prior to Admission medications   Medication Sig Start Date End Date Taking? Authorizing Provider  allopurinol (ZYLOPRIM) 300 MG tablet Take 300 mg by mouth daily.   Yes [provider]  ferric citrate (AURYXIA) 1 GM 210 MG(Fe) tablet Take 2 tablets (420 mg total) by mouth 2 (two) times daily with a meal. 12/02/20  Yes Pokhrel, Laxman, MD  folic acid (FOLVITE) 1 MG tablet Take 1 tablet (1 mg total) by mouth daily. 12/02/20  Yes Pokhrel, Laxman, MD  nitroGLYCERIN (NITROSTAT) 0.4 MG SL tablet Place 0.4 mg under the tongue every 5 (five) minutes as needed for chest pain.   Yes [provider]  tamsulosin (FLOMAX) 0.4 MG CAPS capsule Take 0.4 mg by mouth.   Yes [provider]  torsemide (DEMADEX) 20 MG tablet Take 20 mg by mouth daily. 11/16/20  Yes [provider]  pantoprazole (PROTONIX) 40 MG tablet TAKE 1 TABLET(40 MG) BY MOUTH DAILY Patient not taking: Reported on 03/31/2021 12/05/20   [name], MD    Physical Exam: Vitals:   03/31/21 1530 03/31/21 1545 03/31/21 1600 03/31/21 1615  BP: 126/69 124/77 129/74 127/73  Pulse:   89   Resp: (!) 22 20 20  (!) 23  Temp:      TempSrc:      SpO2:   97%   Weight:      Height:       General:  Appears calm and comfortable and is in NAD Eyes:  PERRL, EOMI, normal lids, iris ENT:  grossly normal hearing, lips & tongue, mmm Neck:  no LAD, masses or  thyromegaly Cardiovascular:  RRR, no m/r/g. No LE edema.  Respiratory:   CTA bilaterally with no wheezes/rales/rhonchi.  Normal respiratory effort. Abdomen:  soft, NT, ND Skin:  no rash or induration seen on limited exam Musculoskeletal:  grossly normal tone BUE/BLE, good ROM, no bony abnormality Psychiatric:  blunted mood and affect, speech fluent and appropriate, AOx3 Neurologic:  CN 2-12 grossly intact, moves all extremities in coordinated fashion   Radiological Exams on Admission: Independently reviewed - see discussion in A/P where applicable  DG Chest Portable 1 View  Result Date: 03/31/2021 CLINICAL DATA:  Shortness of breath. EXAM: PORTABLE CHEST 1 VIEW COMPARISON:  02/05/2021 FINDINGS: 1223 hours. The cardio pericardial silhouette is enlarged. There is pulmonary vascular congestion suspected interstitial edema. The visualized bony structures of the thorax show no acute abnormality. Telemetry leads overlie the chest. IMPRESSION: Enlarged cardiopericardial silhouette with pulmonary vascular congestion and probable interstitial edema. Electronically Signed   By: Misty Stanley M.D.   On: 03/31/2021 12:32    EKG: Independently reviewed.  NSR with rate 95; nonspecific ST changes that appear to be new in the lateral leads  Labs on Admission: I have personally reviewed the available labs and imaging studies at the time of the admission.  Pertinent labs:    BUN 59/Creatinine 9.54/GFR 5 Albumin 2.9 BNP >4500 - stable from 11/2 HS troponin 49 - stable from 11/2 WBC 6.1 Hgb 11.4 Platelets 97 Heme negative  Assessment/Plan * Hypervolemia associated with renal insufficiency -Patient recently started ESRD and has been having generalized weakness and fatigue since starting it -He has been going to HD routinely but this has not improved at all -He is mildly SOB and may be somewhat volume overloaded -Patient on chronic MWF HD -Nephrology prn order set utilized -Will dialyze in  AM -Consider stopping HD if he does not feel like this has improved his quality of life at all, with the understanding that he would likely die of renal failure in days to weeks to months  Failure to thrive in adult- (present on admission) -Patient reports progressive decline despite HD - he feels worse on HD than he did before starting -Will observe with PT/OT/nutrition evaluations -He may benefit from SNF placement (currently in ALF for respite) -He is followed (q52mo) as an outpatient by palliative care -We discussed inpatient consult and he declines at this time -If he is considering stopping HD, will benefit from palliative care consult - still precontemplative at this time  DNR (do not resuscitate)- (present on admission) -I have discussed code status with the patient and his son and  they are in agreement that the patient would not desire resuscitation and would prefer to die a natural death should that situation arise. -He will need a gold  out of facility DNR form at the time of discharge  Refusal of blood transfusions as patient is Jehovah's Witness -Hgb appears to be stable at this time so symptomatic anemia does not appear to be the cause of his fatigue/weakness -Regardless, he would not accept blood products  Chronic systolic (congestive) heart failure ([place]) -Echo in 11/2020 showed EF 45-50% and grade 1 diastolic dysfunction -He may have mild volume overload currently but this is controlled with HD -Continue Demadex  Hypertension- (present on admission) -No longer taking BP medications -Continue Demadex -Will add PRN IV hydralazine  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[place])- (present on admission) -Remote, no longer followed due to "cure" in 2014  Colon cancer [facility])- (present on admission) -h/o but remote/stable       Advance Care Planning:   Code Status: DNR   Consults: Nephrology; PT/OT/TOC team  Family Communication: Son was present throughout  evaluation  Severity of Illness: The appropriate patient status for this patient is OBSERVATION. Observation status is judged to be reasonable and necessary in order to provide the required intensity of service to ensure the patient's safety. The patient's presenting symptoms, physical exam findings, and initial radiographic and laboratory data in the context of their medical condition is felt to place them at decreased risk for further clinical deterioration. Furthermore, it is anticipated that the patient will be medically stable for discharge from the hospital within 2 midnights of admission.   Author: Karmen Bongo 03/31/2021 6:09 PM  For on call review [email or web address].

## 2021-03-31 NOTE — Assessment & Plan Note (Signed)
-  I have discussed code status with the patient and his son and  they are in agreement that the patient would not desire resuscitation and would prefer to die a natural death should that situation arise. -He will need a gold out of facility DNR form at the time of discharge

## 2021-03-31 NOTE — Assessment & Plan Note (Signed)
-  h/o but remote/stable

## 2021-03-31 NOTE — Assessment & Plan Note (Signed)
-  Patient recently started ESRD and has been having generalized weakness and fatigue since starting it -He has been going to HD routinely but this has not improved at all -He is mildly SOB and may be somewhat volume overloaded -Patient on chronic MWF HD -Nephrology prn order set utilized -Will dialyze in AM -Consider stopping HD if he does not feel like this has improved his quality of life at all, with the understanding that he would likely die of renal failure in days to weeks to months

## 2021-03-31 NOTE — Consult Note (Signed)
Reason for Consult: To manage dialysis and dialysis related needs Referring Physician: ER  [name] Colon is an 80 y.o. male with COPD, cirrhosis, history of colon CA, ESRD and failing health- appears to be under the care of [facility] palliative.  He presents to the hospital today with SOB, abdominal pain and weakness.  Of note, is a Neurosurgeon witness.  Last HD session appears to have been 12/21 but they had a power outage and did confirm to me that he was there on Friday 12/23.  Patient is to be admitted and we are asked to provide his routine HD.  Labs of note-  BNP over 4500, elevated troponin-  K of 5.1, BUN 59, albumin 2.9, hgb 11.4.  CXR c/w edema-  sat is 95 on room air.  He says that he feels badly pretty much after every treatment and it lasts a day    Dialyzes at [facility]  MWF 4 hours   EDW 64.2. HD Bath 2/2, Dialyzer opti 180, Heparin yes-   2400 units. Access AVF-  15 gauge 400 BFR.  No meds-  hgb over 11 on 12/21-  calc 10.1 with PTH 60-  last there on 12/23-  ran short-  post weight unknown  Past Medical History:  Diagnosis Date   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[place])    Colon cancer ([place])    ESRD (end stage renal disease) on dialysis ([place])    Hypertension     Past Surgical History:  Procedure Laterality Date   ESOPHAGOGASTRODUODENOSCOPY (EGD) WITH PROPOFOL N/A 11/22/2020   Procedure: ESOPHAGOGASTRODUODENOSCOPY (EGD) WITH PROPOFOL;  Surgeon: [name], MD;  Location: [facility];  Service: Gastroenterology;  Laterality: N/A;   HEMOSTASIS CLIP PLACEMENT  11/22/2020   Procedure: HEMOSTASIS CLIP PLACEMENT;  Surgeon: [name], MD;  Location: [facility] ENDOSCOPY;  Service: Gastroenterology;;    History reviewed. No pertinent family history.  Social History:  reports that he quit smoking about 38 years ago. His smoking use included cigarettes. He has never used smokeless tobacco. He reports that he does not currently use alcohol. He reports that he does not currently  use drugs.  Allergies: No Known Allergies  Medications: I have reviewed the patient's current medications.  Results for orders placed or performed during the hospital encounter of 03/31/21 (from the past 48 hour(s))  POC occult blood, ED     Status: None   Collection Time: 03/31/21 12:09 PM  Result Value Ref Range   Fecal Occult Bld NEGATIVE NEGATIVE  CBC with Differential     Status: Abnormal   Collection Time: 03/31/21 12:37 PM  Result Value Ref Range   WBC 6.1 4.0 - 10.5 K/uL   RBC 3.62 (L) 4.22 - 5.81 MIL/uL   Hemoglobin 11.4 (L) 13.0 - 17.0 g/dL   HCT 34.7 (L) 39.0 - 52.0 %   MCV 95.9 80.0 - 100.0 fL   MCH 31.5 26.0 - 34.0 pg   MCHC 32.9 30.0 - 36.0 g/dL   RDW 17.4 (H) 11.5 - 15.5 %   Platelets 97 (L) 150 - 400 K/uL    Comment: Immature Platelet Fraction may be clinically indicated, consider ordering this additional test [id number] REPEATED TO VERIFY PLATELET COUNT CONFIRMED BY SMEAR    nRBC 0.0 0.0 - 0.2 %   Neutrophils Relative % 64 %   Neutro Abs 3.9 1.7 - 7.7 K/uL   Lymphocytes Relative 26 %   Lymphs Abs 1.6 0.7 - 4.0 K/uL   Monocytes Relative 8 %  Monocytes Absolute 0.5 0.1 - 1.0 K/uL   Eosinophils Relative 1 %   Eosinophils Absolute 0.0 0.0 - 0.5 K/uL   Basophils Relative 1 %   Basophils Absolute 0.0 0.0 - 0.1 K/uL   Immature Granulocytes 0 %   Abs Immature Granulocytes 0.01 0.00 - 0.07 K/uL    Comment: Performed at [facility] [address], [place], [place] [address]  Comprehensive metabolic panel     Status: Abnormal   Collection Time: 03/31/21 12:37 PM  Result Value Ref Range   Sodium 137 135 - 145 mmol/L   Potassium 5.1 3.5 - 5.1 mmol/L   Chloride 97 (L) 98 - 111 mmol/L   CO2 27 22 - 32 mmol/L   Glucose, Bld 111 (H) 70 - 99 mg/dL    Comment: Glucose reference range applies only to samples taken after fasting for at least 8 hours.   BUN 59 (H) 8 - 23 mg/dL   Creatinine, Ser 9.54 (H) 0.61 - 1.24 mg/dL   Calcium 9.6 8.9 - 10.3 mg/dL   Total  Protein 7.0 6.5 - 8.1 g/dL   Albumin 2.9 (L) 3.5 - 5.0 g/dL   AST 28 15 - 41 U/L   ALT 40 0 - 44 U/L   Alkaline Phosphatase 113 38 - 126 U/L   Total Bilirubin 1.3 (H) 0.3 - 1.2 mg/dL   GFR, Estimated 5 (L) >60 mL/min    Comment: (NOTE) Calculated using the CKD-EPI Creatinine Equation (2021)    Anion gap 13 5 - 15    Comment: Performed at [facility] [address] [place], [place] [address]  Lipase, blood     Status: Abnormal   Collection Time: 03/31/21 12:37 PM  Result Value Ref Range   Lipase 53 (H) 11 - 51 U/L    Comment: Performed at [facility] [address] [place], [place] [address]  Troponin I (High Sensitivity)     Status: Abnormal   Collection Time: 03/31/21 12:37 PM  Result Value Ref Range   Troponin I (High Sensitivity) 49 (H) <18 ng/L    Comment: (NOTE) Elevated high sensitivity troponin I (hsTnI) values and significant  changes across serial measurements may suggest ACS but many other  chronic and acute conditions are known to elevate hsTnI results.  Refer to the "Links" section for chest pain algorithms and additional  guidance. Performed at [facility], [place] [address], [place], [place] [address]   Brain natriuretic peptide     Status: Abnormal   Collection Time: 03/31/21 12:37 PM  Result Value Ref Range   B Natriuretic Peptide >4,500.0 (H) 0.0 - 100.0 pg/mL    Comment: Performed at [facility] [address] [place], [place] [address]    DG Chest Portable 1 View  Result Date: 03/31/2021 CLINICAL DATA:  Shortness of breath. EXAM: PORTABLE CHEST 1 VIEW COMPARISON:  02/05/2021 FINDINGS: 1223 hours. The cardio pericardial silhouette is enlarged. There is pulmonary vascular congestion suspected interstitial edema. The visualized bony structures of the thorax show no acute abnormality. Telemetry leads overlie the chest. IMPRESSION: Enlarged cardiopericardial silhouette with pulmonary vascular congestion and probable interstitial  edema. Electronically Signed   By: Misty Stanley M.D.   On: 03/31/2021 12:32    ROS: sob, weak, off and on abdominal pain but not today  Blood pressure 116/68, pulse 85, temperature 97.7 F (36.5 C), temperature source Oral, resp. rate 19, height 5\' 8"  (1.727 m), weight 65.3 kg,  SpO2 99 %. General appearance: alert, distracted, and no distress Resp: diminished breath sounds bibasilar Cardio: regular rate and rhythm, S1, S2 normal, no murmur, click, rub or gallop GI: soft, non-tender; bowel sounds normal; no masses,  no organomegaly Extremities: extremities normal, atraumatic, no cyanosis or edema Left upper arm AVF-  good thrill and bruit  Assessment/Plan: 80 year old male with multiple medical issues including ESRD-  now with what appears to be mild volume overload  1 SOB-  has newly diagnosed COPD and also evidence of some volume on CXR-  sats 100 % on 2 liters -  will UF as able with HD but BP appears to be soft, so that will limit 2 ESRD: has been on HD about 10 months-  really failing to thrive unfortunately-  today is his regular day-  too busy up on HD today will need to put off routine treatment to tomorrow off schedule  3 Hypertension/vol: no peripheral edema but some pulm edema-  UF as able-  not on any BP meds except flomax  4. Anemia of ESRD: no meds as OP-  hgb here over 11-  no need for meds 5. Metabolic Bone Disease: PTH low and calc high-  no vit d or sensipar as OP-  is listed as being on [place]-  will not start right away given some mild GI complaints 6.  FTT-  unfortunately seems to be an issue-  ? Followed by palliative as OP?  Not sure we will find a lot of reversibility here   [name] Colon 03/31/2021, 3:03 PM

## 2021-03-31 NOTE — ED Notes (Addendum)
Pt from [facility]. Pt uses O2 prn. Pt is L arm restricted dialysis pt MWF. Last tx was Friday

## 2021-03-31 NOTE — ED Triage Notes (Addendum)
Pt is here from [facility] and here with generalized weakness, abdominal pain, and SOB.  Pt states he uses 02/PRN at 2L and has been having to use it more recently than his normal. Pt is due for HD today. Pt is alert and oriented. PT is a Jehovah witness and refuses blood products.

## 2021-03-31 NOTE — Assessment & Plan Note (Signed)
-  No longer taking BP medications -Continue Demadex -Will add PRN IV hydralazine

## 2021-03-31 NOTE — Assessment & Plan Note (Signed)
-  Patient reports progressive decline despite HD - he feels worse on HD than he did before starting -Will observe with PT/OT/nutrition evaluations -He may benefit from SNF placement (currently in ALF for respite) -He is followed (q46mo) as an outpatient by palliative care -We discussed inpatient consult and he declines at this time -If he is considering stopping HD, will benefit from palliative care consult - still precontemplative at this time

## 2021-03-31 NOTE — ED Provider Notes (Signed)
[facility] EMERGENCY DEPARTMENT Provider Note   CSN: [id number] Arrival date & time: 03/31/21  1144     History Chief Complaint  Patient presents with   Weakness   Shortness of Breath   Abdominal Pain    [name] Colon is a 80 y.o. male.  The history is provided by the patient.  Weakness Severity:  Moderate Onset quality:  Gradual Duration:  2 days Timing:  Constant Progression:  Worsening Chronicity:  New Relieved by:  Nothing Worsened by:  Nothing Associated symptoms: abdominal pain and shortness of breath   Associated symptoms: no anorexia, no aphasia, no arthralgias, no ataxia, no chest pain, no cough, no diarrhea, no difficulty walking, no dizziness, no drooling, no dysphagia, no dysuria, no numbness in extremities, no falls, no fever, no foul-smelling urine, no frequency, no headaches, no myalgias, no nausea, no near-syncope, no seizures, no stroke symptoms, no syncope, no urgency and no vomiting   Risk factors comment:  CHF/ESRD/cirrhosis     Past Medical History:  Diagnosis Date   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HCC)    Colon cancer ([place])    ESRD (end stage renal disease) on dialysis [facility])    Hypertension     Patient Active Problem List   Diagnosis Date Noted   Mallory-Weiss tear    Volume overload 11/20/2020   ESRD (end stage renal disease) on dialysis ([place]) 11/20/2020   Colon cancer ([place]) 11/20/2020   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[place]) 11/20/2020   Hypertension 11/20/2020   Vasovagal episode 11/20/2020   Hematemesis 11/20/2020   Acute on chronic combined systolic and diastolic CHF (congestive heart failure) ([place]) 11/20/2020   Acute pulmonary edema (HCC)     Past Surgical History:  Procedure Laterality Date   ESOPHAGOGASTRODUODENOSCOPY (EGD) WITH PROPOFOL N/A 11/22/2020   Procedure: ESOPHAGOGASTRODUODENOSCOPY (EGD) WITH PROPOFOL;  Surgeon: [name], MD;  Location: [place];  Service: Gastroenterology;   Laterality: N/A;   HEMOSTASIS CLIP PLACEMENT  11/22/2020   Procedure: HEMOSTASIS CLIP PLACEMENT;  Surgeon: [name], MD;  Location: [facility] ENDOSCOPY;  Service: Gastroenterology;;       History reviewed. No pertinent family history.  Social History   Tobacco Use   Smoking status: Former    Types: Cigarettes    Quit date: 1985    Years since quitting: 38.0   Smokeless tobacco: Never  Vaping Use   Vaping Use: Never used  Substance Use Topics   Alcohol use: Not Currently   Drug use: Not Currently    Home Medications Prior to Admission medications   Medication Sig Start Date End Date Taking? Authorizing Provider  allopurinol (ZYLOPRIM) 300 MG tablet Take 300 mg by mouth daily.   Yes [provider]  ferric citrate (AURYXIA) 1 GM 210 MG(Fe) tablet Take 2 tablets (420 mg total) by mouth 2 (two) times daily with a meal. 12/02/20  Yes Pokhrel, Laxman, MD  folic acid (FOLVITE) 1 MG tablet Take 1 tablet (1 mg total) by mouth daily. 12/02/20  Yes Pokhrel, Laxman, MD  nitroGLYCERIN (NITROSTAT) 0.4 MG SL tablet Place 0.4 mg under the tongue every 5 (five) minutes as needed for chest pain.   Yes [provider]  tamsulosin (FLOMAX) 0.4 MG CAPS capsule Take 0.4 mg by mouth.   Yes [provider]  torsemide (DEMADEX) 20 MG tablet Take 20 mg by mouth daily. 11/16/20  Yes [provider]  pantoprazole (PROTONIX) 40 MG tablet TAKE 1 TABLET(40 MG) BY MOUTH DAILY Patient not  taking: Reported on 03/31/2021 12/05/20   [name], MD    Allergies    Patient has no known allergies.  Review of Systems   Review of Systems  Constitutional:  Negative for chills and fever.  HENT:  Negative for drooling, ear pain and sore throat.   Eyes:  Negative for pain and visual disturbance.  Respiratory:  Positive for shortness of breath. Negative for cough.   Cardiovascular:  Negative for chest pain, palpitations, syncope and near-syncope.  Gastrointestinal:   Positive for abdominal pain. Negative for anorexia, diarrhea, dysphagia, nausea and vomiting.       Dark stools but states hes on iron   Genitourinary:  Negative for dysuria, frequency, hematuria and urgency.  Musculoskeletal:  Negative for arthralgias, back pain, falls and myalgias.  Skin:  Negative for color change and rash.  Neurological:  Positive for weakness. Negative for dizziness, seizures, syncope, facial asymmetry, light-headedness, numbness and headaches.  All other systems reviewed and are negative.  Physical Exam Updated Vital Signs BP 121/72 (BP Location: Right Arm)    Pulse 85    Temp 97.7 F (36.5 C) (Oral)    Resp 16    Ht 5\' 8"  (1.727 m)    Wt 65.3 kg    SpO2 99%    BMI 21.90 kg/m   Physical Exam Vitals and nursing note reviewed.  Constitutional:      General: He is not in acute distress.    Appearance: He is well-developed. He is not ill-appearing.  HENT:     Head: Normocephalic and atraumatic.  Eyes:     Extraocular Movements: Extraocular movements intact.     Conjunctiva/sclera: Conjunctivae normal.     Pupils: Pupils are equal, round, and reactive to light.  Cardiovascular:     Rate and Rhythm: Normal rate and regular rhythm.     Pulses: Normal pulses.     Heart sounds: Normal heart sounds. No murmur heard. Pulmonary:     Effort: Pulmonary effort is normal. No respiratory distress.     Breath sounds: Normal breath sounds. No decreased breath sounds or wheezing.  Abdominal:     Palpations: Abdomen is soft.     Tenderness: There is no abdominal tenderness. There is no guarding or rebound.  Genitourinary:    Comments: Black stool on exam  Musculoskeletal:        General: No swelling.     Cervical back: Normal range of motion and neck supple.     Right lower leg: No edema.     Left lower leg: No edema.  Skin:    General: Skin is warm and dry.     Capillary Refill: Capillary refill takes less than 2 seconds.  Neurological:     General: No focal deficit  present.     Mental Status: He is alert.  Psychiatric:        Mood and Affect: Mood normal.    ED Results / Procedures / Treatments   Labs (all labs ordered are listed, but only abnormal results are displayed) Labs Reviewed  CBC WITH DIFFERENTIAL/PLATELET - Abnormal; Notable for the following components:      Result Value   RBC 3.62 (*)    Hemoglobin 11.4 (*)    HCT 34.7 (*)    RDW 17.4 (*)    Platelets 97 (*)    All other components within normal limits  COMPREHENSIVE METABOLIC PANEL - Abnormal; Notable for the following components:   Chloride 97 (*)    Glucose,  Bld 111 (*)    BUN 59 (*)    Creatinine, Ser 9.54 (*)    Albumin 2.9 (*)    Total Bilirubin 1.3 (*)    GFR, Estimated 5 (*)    All other components within normal limits  LIPASE, BLOOD - Abnormal; Notable for the following components:   Lipase 53 (*)    All other components within normal limits  BRAIN NATRIURETIC PEPTIDE - Abnormal; Notable for the following components:   B Natriuretic Peptide >4,500.0 (*)    All other components within normal limits  TROPONIN I (HIGH SENSITIVITY) - Abnormal; Notable for the following components:   Troponin I (High Sensitivity) 49 (*)    All other components within normal limits  RESP PANEL BY RT-PCR (FLU A&B, COVID) ARPGX2  POC OCCULT BLOOD, ED  TROPONIN I (HIGH SENSITIVITY)    EKG EKG Interpretation  Date/Time:  Monday March 31 2021 12:05:41 EST Ventricular Rate:  95 PR Interval:  192 QRS Duration: 109 QT Interval:  381 QTC Calculation: 479 R Axis:   106 Text Interpretation: Sinus rhythm Right axis deviation Consider left ventricular hypertrophy Abnormal T, consider ischemia, lateral leads Confirmed by Lennice Sites ([id number]) on 03/31/2021 12:10:19 PM  Radiology DG Chest Portable 1 View  Result Date: 03/31/2021 CLINICAL DATA:  Shortness of breath. EXAM: PORTABLE CHEST 1 VIEW COMPARISON:  02/05/2021 FINDINGS: 1223 hours. The cardio pericardial silhouette is enlarged.  There is pulmonary vascular congestion suspected interstitial edema. The visualized bony structures of the thorax show no acute abnormality. Telemetry leads overlie the chest. IMPRESSION: Enlarged cardiopericardial silhouette with pulmonary vascular congestion and probable interstitial edema. Electronically Signed   By: Misty Stanley M.D.   On: 03/31/2021 12:32    Procedures Procedures   Medications Ordered in ED Medications - No data to display  ED Course  I have reviewed the triage vital signs and the nursing notes.  Pertinent labs & imaging results that were available during my care of the patient were reviewed by me and considered in my medical decision making (see chart for details).    MDM Rules/Calculators/A&P                          [name] Colon is here with shortness of breath, weakness.  Patient with unremarkable vitals.  No fever.  Patient with history of end-stage renal disease, cirrhosis, hypertension, heart failure possibly.  Patient states he has been short of breath, weak.  Last dialysis was 3 days ago.  Was supposed to go to dialysis today but assisted living facility called for EMS.  Patient felt weak trying to get up today.  Has history of GI bleed in the past but states that his stool is always dark because he is on iron.  Stool is black on exam but Hemoccult is negative.  Hemoglobin is 11.4.  Suspect volume overload likely multifactorial from missed dialysis and cirrhosis and heart failure.  Patient does have potassium of 5.1 but creatinine and BUN are elevated above baseline.  Troponin is 49.  BNP is greater than 4500.  EKG shows sinus rhythm.  Has some mild ST depressions laterally.  EKG shows volume overload.  Patient on 2 L of oxygen but wears oxygen intermittently.  Talked with Dr. Jonnie Finner with nephrology who recommends admission as he will likely not be able to get dialysis today and hopefully will be able to get dialysis sometime in the morning tomorrow.  Patient to  be admitted to  medicine for further work-up.  This chart was dictated using voice recognition software.  Despite best efforts to proofread,  errors can occur which can change the documentation meaning.      Final Clinical Impression(s) / ED Diagnoses Final diagnoses:  Hypervolemia, unspecified hypervolemia type  ESRD (end stage renal disease) ([place])  SOB (shortness of breath)    Rx / DC Orders ED Discharge Orders     None        Lennice Sites, DO 03/31/21 1440

## 2021-04-01 DIAGNOSIS — N289 Disorder of kidney and ureter, unspecified: Secondary | ICD-10-CM | POA: Diagnosis not present

## 2021-04-01 DIAGNOSIS — E8770 Fluid overload, unspecified: Secondary | ICD-10-CM | POA: Diagnosis not present

## 2021-04-01 LAB — BASIC METABOLIC PANEL
Anion gap: 16 — ABNORMAL HIGH (ref 5–15)
BUN: 70 mg/dL — ABNORMAL HIGH (ref 8–23)
CO2: 24 mmol/L (ref 22–32)
Calcium: 9.5 mg/dL (ref 8.9–10.3)
Chloride: 99 mmol/L (ref 98–111)
Creatinine, Ser: 10.65 mg/dL — ABNORMAL HIGH (ref 0.61–1.24)
GFR, Estimated: 4 mL/min — ABNORMAL LOW (ref >60)
Glucose, Bld: 93 mg/dL (ref 70–99)
Potassium: 5 mmol/L (ref 3.5–5.1)
Sodium: 139 mmol/L (ref 135–145)

## 2021-04-01 LAB — CBC
HCT: 34.4 % — ABNORMAL LOW (ref 39.0–52.0)
Hemoglobin: 11.4 g/dL — ABNORMAL LOW (ref 13.0–17.0)
MCH: 31.8 pg (ref 26.0–34.0)
MCHC: 33.1 g/dL (ref 30.0–36.0)
MCV: 95.8 fL (ref 80.0–100.0)
Platelets: 101 10*3/uL — ABNORMAL LOW (ref 150–400)
RBC: 3.59 MIL/uL — ABNORMAL LOW (ref 4.22–5.81)
RDW: 17.3 % — ABNORMAL HIGH (ref 11.5–15.5)
WBC: 5.6 10*3/uL (ref 4.0–10.5)
nRBC: 0 % (ref 0.0–0.2)

## 2021-04-01 LAB — GLUCOSE, CAPILLARY: Glucose-Capillary: 100 mg/dL — ABNORMAL HIGH (ref 70–99)

## 2021-04-01 MED ORDER — ALBUMIN HUMAN 25 % IV SOLN
25.0000 g | Freq: Once | INTRAVENOUS | Status: AC
  Administered 2021-04-01: 07:00:00 25 g via INTRAVENOUS

## 2021-04-01 MED ORDER — ALBUMIN HUMAN 25 % IV SOLN
INTRAVENOUS | Status: AC
  Filled 2021-04-01: qty 100

## 2021-04-01 MED ORDER — HEPARIN SODIUM (PORCINE) 1000 UNIT/ML DIALYSIS
20.0000 [IU]/kg | INTRAMUSCULAR | Status: DC | PRN
  Administered 2021-04-01: 1300 [IU] via INTRAVENOUS_CENTRAL

## 2021-04-01 MED ORDER — HEPARIN SODIUM (PORCINE) 1000 UNIT/ML IJ SOLN
INTRAMUSCULAR | Status: AC
  Filled 2021-04-01: qty 2

## 2021-04-01 NOTE — Progress Notes (Signed)
Patient has been listed as OTF since early AM.  Needs PT/OT eval as well as TOC eval prior to d/c.   Eulogio Bear DO

## 2021-04-01 NOTE — Care Management Obs Status (Signed)
[facility] NOTIFICATION   Patient Details  Name: Kyal Arts MRN: [id number] Date of Birth: 09-Dec-1940   Medicare Observation Status Notification Given:  Yes    Tom-Johnson, Renea Ee, RN 04/01/2021, 4:13 PM

## 2021-04-01 NOTE — Progress Notes (Signed)
NEW ADMISSION NOTE New Admission Note:   Arrival Method: ED stretcher Mental Orientation: AAOx4 Telemetry: n/a Assessment: Completed Skin: Intact IV: n/a Pain: 0/10 Tubes: RUA fistula Safety Measures: Safety Fall Prevention Plan has been given, discussed and signed Admission: Completed [facility] Orientation: Patient has been orientated to the room, unit and staff.  Family: Son Levy Pupa at bedside [facility])  Orders have been reviewed and implemented. Will continue to monitor the patient. Call light has been placed within reach and bed alarm has been activated.   Vira Agar, RN

## 2021-04-01 NOTE — Procedures (Signed)
Patient was seen on dialysis and the procedure was supervised.  BFR 400  Via AVF BP is  119/70.   Patient appears to be tolerating treatment well  Louis Meckel 04/01/2021

## 2021-04-01 NOTE — Evaluation (Signed)
Physical Therapy Evaluation Patient Details Name: [name] Colon MRN: [id number] DOB: Sep 03, 1940 Today's Date: 04/01/2021  History of Present Illness  Pt is a 80 y.o. M who presents 03/31/2021 with weakness/SOB. Significant PMH: ESRD on HD, HTN, chronic hep C with cirrhosis, Jehovah's witness, chronic systolic CHF, colon CA.  Clinical Impression  Pt admitted with above. Prior to admission, pt had been staying at [facility] for respite care for one month while his family was on vacation. Pt presents with generalized weakness, balance deficits, and decreased activity tolerance. Requiring min assist for transfers and ambulating ~30 ft with a walker at a min guard assist level. SpO2 100% on 2L O2 (pt son reports he wears it "as needed), HR in 90's. Discussed d/c plan with pt son; current plan is for pt to return to [facility] for short term and then transition home with pt son and PCA. They would like to also discuss goals of care with palliative to determine next steps.     Recommendations for follow up therapy are one component of a multi-disciplinary discharge planning process, led by the attending physician.  Recommendations may be updated based on patient status, additional functional criteria and insurance authorization.  Follow Up Recommendations Home health PT    Assistance Recommended at Discharge Frequent or constant Supervision/Assistance  Functional Status Assessment Patient has had a recent decline in their functional status and demonstrates the ability to make significant improvements in function in a reasonable and predictable amount of time.  Equipment Recommendations  BSC/3in1    Recommendations for Other Services       Precautions / Restrictions Precautions Precautions: Fall Restrictions Weight Bearing Restrictions: No      Mobility  Bed Mobility Overal bed mobility: Needs Assistance Bed Mobility: Supine to Sit;Sit to Supine     Supine to sit: Min  guard Sit to supine: Min guard   General bed mobility comments: increased time, no physical assist required    Transfers Overall transfer level: Needs assistance Equipment used: Rolling walker (2 wheels) Transfers: Sit to/from Stand Sit to Stand: Min assist           General transfer comment: Light minA to boost up to stand, cues for hand placement    Ambulation/Gait Ambulation/Gait assistance: Min guard Gait Distance (Feet): 30 Feet Assistive device: Rolling walker (2 wheels) Gait Pattern/deviations: Step-through pattern;Decreased stride length;Trunk flexed;Decreased dorsiflexion - left;Shuffle Gait velocity: decreased     General Gait Details: Pt with shuffling gait, decreased bilateral foot clearance, slow and effortful. min guard for safety. cues for walker proximity  Stairs            Wheelchair Mobility    Modified Rankin (Stroke Patients Only)       Balance Overall balance assessment: Needs assistance Sitting-balance support: Feet supported Sitting balance-Leahy Scale: Fair     Standing balance support: Bilateral upper extremity supported Standing balance-Leahy Scale: Poor Standing balance comment: reliant on RW                             Pertinent Vitals/Pain Pain Assessment: Faces Faces Pain Scale: No hurt    Home Living Family/patient expects to be discharged to:: Private residence Living Arrangements: Children Available Help at Discharge: Family;Available 24 hours/day Type of Home: House Home Access: Stairs to enter Entrance Stairs-Rails: Psychiatric nurse of Steps: 4   Home Layout: One level;Other (Comment) Home Equipment: Cane - single point;Rolling Walker (2 wheels);Rollator (4 wheels);Tub bench  Additional Comments: PCA MWF 9-11:30    Prior Function Prior Level of Function : Needs assist             Mobility Comments: uses Rollator ADLs Comments: PCA assists with ADL's, also picks up from  dialysis     Hand Dominance   Dominant Hand: Right    Extremity/Trunk Assessment   Upper Extremity Assessment Upper Extremity Assessment: Generalized weakness    Lower Extremity Assessment Lower Extremity Assessment: Generalized weakness    Cervical / Trunk Assessment Cervical / Trunk Assessment: Kyphotic;Other exceptions (forward head posture)  Communication   Communication: No difficulties  Cognition Arousal/Alertness: Awake/alert Behavior During Therapy: WFL for tasks assessed/performed Overall Cognitive Status: Impaired/Different from baseline Area of Impairment: Memory                     Memory: Decreased short-term memory         General Comments: deficits noted in [facility]        General Comments      Exercises     Assessment/Plan    PT Assessment Patient needs continued PT services  PT Problem List Decreased strength;Decreased activity tolerance;Decreased mobility;Decreased balance       PT Treatment Interventions DME instruction;Gait training;Stair training;Therapeutic activities;Functional mobility training;Therapeutic exercise;Balance training;Patient/family education    PT Goals (Current goals can be found in the Care Plan section)  Acute Rehab PT Goals Patient Stated Goal: to meet with palliative to discuss goals of care PT Goal Formulation: With patient/family Time For Goal Achievement: 04/15/21 Potential to Achieve Goals: Fair    Frequency Min 3X/week   Barriers to discharge        Co-evaluation               AM-PAC PT "6 Clicks" Mobility  Outcome Measure Help needed turning from your back to your side while in a flat bed without using bedrails?: None Help needed moving from lying on your back to sitting on the side of a flat bed without using bedrails?: A Little Help needed moving to and from a bed to a chair (including a wheelchair)?: A Little Help needed standing up from a chair using your arms (e.g., wheelchair or  bedside chair)?: A Little Help needed to walk in hospital room?: A Little Help needed climbing 3-5 steps with a railing? : A Lot 6 Click Score: 18    End of Session Equipment Utilized During Treatment: Gait belt;Oxygen Activity Tolerance: Patient limited by fatigue Patient left: in bed;with call bell/phone within reach;with bed alarm set;with family/visitor present Nurse Communication: Mobility status PT Visit Diagnosis: Unsteadiness on feet (R26.81);Muscle weakness (generalized) (M62.81)    Time: 5726-2035 PT Time Calculation (min) (ACUTE ONLY): 30 min   Charges:   PT Evaluation $PT Eval Moderate Complexity: 1 Mod PT Treatments $Gait Training: 8-22 mins        Wyona Almas, PT, DPT Acute Rehabilitation Services Pager [phone] Office [phone]   Deno Etienne 04/01/2021, 5:37 PM

## 2021-04-01 NOTE — ED Notes (Signed)
°   04/01/21 1040  Vitals  Temp 97.6 F (36.4 C)  Temp Source Oral  BP 103/61  BP Location Right Arm  BP Method Automatic  Patient Position (if appropriate) Lying  Pulse Rate 75  Pulse Rate Source Monitor  Resp 17  Oxygen Therapy  SpO2 100 %  O2 Device Room Air  Pain Assessment  Pain Scale 0-10  Pain Score 0  Post-Hemodialysis Assessment  Rinseback Volume (mL) 250 mL  KECN 241 V  Dialyzer Clearance Lightly streaked  Duration of HD Treatment -hour(s) 3.5 hour(s)  Hemodialysis Intake (mL) 500 mL  UF Total -Machine (mL) 3500 mL  Net UF (mL) 3000 mL  Tolerated HD Treatment Yes  Post-Hemodialysis Comments tx complete-pt stable   HD tx completed, no problems noted. Per Mady Gemma, RN renal coordinator pt will be admitted to room [id number]. Attempted to give report, awaiting phone call from receiving RN.

## 2021-04-01 NOTE — Progress Notes (Addendum)
Progress Note    [name] Colon  OIN:[id number] DOB: November 16, 1940  DOA: 03/31/2021 PCP: Leonel Ramsay, MD    Brief Narrative:     Medical records reviewed and are as summarized below:  [name] Colon is an 80 y.o. male with medical history significant of ESRD on HD; HTN; chronic hep C with cirrhosis; Jehovah's witness; chronic systolic CHF; and colon cancer presenting with weakness/SOB.  He was last hospitalized from 8/16-30 for CP and missed HD; he required BIPAP and a NTG drip and developed bradycardia requiring CPR.  He got up this AM to go to HD and he just couldn't go.  He felt so weak and bad.  He did not feel well yesterday, about the same.  This has been going on for about 3-4 months, since he has been on HD.  He is miserable on the days he doesn't do HD and lays around all day.  He called his family this AM and felt ok but felt so bad that he called back an hour later and told them he needed to come to the hospital.  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   DNR (do not resuscitate)   Failure to thrive in adult   Hypervolemia associated with renal insufficiency -Patient recently started ESRD and has been having generalized weakness and fatigue since starting it -He has been going to HD routinely but this has not improved at all -He is mildly SOB and may be somewhat volume overloaded -Patient on chronic MWF HD -Nephrology prn order set utilized -s/p HD -Consider stopping HD as he does not feel like this has improved his quality of life at all, with the understanding that he would likely die of renal failure in days to weeks to months   Failure to thrive in adult- (present on admission) -Patient reports  progressive decline despite HD - he feels worse on HD than he did before starting -Will observe with PT/OT/nutrition evaluations -He may benefit from SNF placement (currently in ALF for respite) -He is followed (q72mo) as an outpatient by palliative care -he is considering stopping HD, will benefit from palliative care consult - still precontemplative at this time   DNR (do not resuscitate)- (present on admission) -I have discussed code status with the patient and his son and  they are in agreement that the patient would not desire resuscitation and would prefer to die a natural death should that situation arise. -GOC as patient does not seem to be doing well with HD   Refusal of blood transfusions as patient is Jehovah's Witness -Hgb appears to be stable at this time so symptomatic anemia does not appear to be the cause of his fatigue/weakness -Regardless, he would not accept blood products   Chronic systolic (congestive) heart failure ([place]) -Echo in 11/2020 showed EF 45-50% and grade 1 diastolic dysfunction -He may have mild volume overload currently but this is controlled with HD -Continue Demadex   Hypertension- (present on admission) -No longer taking BP medications -Continue Demadex   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[place])- (present on admission) -Remote, no longer followed due to "cure" in 2014   Colon cancer [facility])- (present on admission) -h/o but remote/stable     Family Communication/Anticipated D/C date and  plan/Code Status   DVT prophylaxis: Lovenox ordered. Code Status: DNR Disposition Plan: Status is: Observation Had to LM for son. [name]  The patient will require care spanning > 2 midnights and should be moved to inpatient because:         Medical Consultants:   Renal Palliative care  Subjective:  Still feels weak  Objective:    Vitals:   04/01/21 1035 04/01/21 1040 04/01/21 1100 04/01/21 1136  BP: (!) 104/57 103/61  120/65  Pulse: 73 75  86   Resp:  17  18  Temp:  97.6 F (36.4 C)  97.8 F (36.6 C)  TempSrc:  Oral  Oral  SpO2:  100%  100%  Weight:   58.8 kg 58.8 kg  Height:    5\' 8"  (1.727 m)    Intake/Output Summary (Last 24 hours) at 04/01/2021 1212 Last data filed at 04/01/2021 1040 Gross per 24 hour  Intake --  Output 3000 ml  Net -3000 ml   Filed Weights   04/01/21 0649 04/01/21 1100 04/01/21 1136  Weight: 65.3 kg 58.8 kg 58.8 kg    Exam:  General: Appearance:    Thin male in no acute distress     Lungs:     respirations unlabored  Heart:    Normal heart rate.    MS:   All extremities are intact.    Neurologic:   Awake, alert, oriented x 3. No apparent focal neurological           defect.      Data Reviewed:   I have personally reviewed following labs and imaging studies:  Labs: Labs show the following:   Basic Metabolic Panel: Recent Labs  Lab 03/31/21 1237 04/01/21 0450  NA 137 139  K 5.1 5.0  CL 97* 99  CO2 27 24  GLUCOSE 111* 93  BUN 59* 70*  CREATININE 9.54* 10.65*  CALCIUM 9.6 9.5   GFR Estimated Creatinine Clearance: 4.4 mL/min (A) (by C-G formula based on SCr of 10.65 mg/dL (H)). Liver Function Tests: Recent Labs  Lab 03/31/21 1237  AST 28  ALT 40  ALKPHOS 113  BILITOT 1.3*  PROT 7.0  ALBUMIN 2.9*   Recent Labs  Lab 03/31/21 1237  LIPASE 53*   No results for input(s): AMMONIA in the last 168 hours. Coagulation profile No results for input(s): INR, PROTIME in the last 168 hours.  CBC: Recent Labs  Lab 03/31/21 1237 04/01/21 0450  WBC 6.1 5.6  NEUTROABS 3.9  --   HGB 11.4* 11.4*  HCT 34.7* 34.4*  MCV 95.9 95.8  PLT 97* 101*   Cardiac Enzymes: No results for input(s): CKTOTAL, CKMB, CKMBINDEX, TROPONINI in the last 168 hours. BNP (last 3 results) No results for input(s): PROBNP in the last 8760 hours. CBG: No results for input(s): GLUCAP in the last 168 hours. D-Dimer: No results for input(s): DDIMER in the last 72 hours. Hgb A1c: No results for  input(s): HGBA1C in the last 72 hours. Lipid Profile: No results for input(s): CHOL, HDL, LDLCALC, TRIG, CHOLHDL, LDLDIRECT in the last 72 hours. Thyroid function studies: No results for input(s): TSH, T4TOTAL, T3FREE, THYROIDAB in the last 72 hours.  Invalid input(s): FREET3 Anemia work up: No results for input(s): VITAMINB12, FOLATE, FERRITIN, TIBC, IRON, RETICCTPCT in the last 72 hours. Sepsis Labs: Recent Labs  Lab 03/31/21 1237 04/01/21 0450  WBC 6.1 5.6    Microbiology Recent Results (from the past 240 hour(s))  Resp Panel by RT-PCR (Flu A&B,  Covid) Nasopharyngeal Swab     Status: None   Collection Time: 03/31/21 12:08 PM   Specimen: Nasopharyngeal Swab; Nasopharyngeal(NP) swabs in vial transport medium  Result Value Ref Range Status   SARS Coronavirus 2 by RT PCR NEGATIVE NEGATIVE Final    Comment: (NOTE) SARS-CoV-2 target nucleic acids are NOT DETECTED.  The SARS-CoV-2 RNA is generally detectable in upper respiratory specimens during the acute phase of infection. The lowest concentration of SARS-CoV-2 viral copies this assay can detect is 138 copies/mL. A negative result does not preclude SARS-Cov-2 infection and should not be used as the sole basis for treatment or other patient management decisions. A negative result may occur with  improper specimen collection/handling, submission of specimen other than nasopharyngeal swab, presence of viral mutation(s) within the areas targeted by this assay, and inadequate number of viral copies(<138 copies/mL). A negative result must be combined with clinical observations, patient history, and epidemiological information. The expected result is Negative.  Fact Sheet for Patients:  EntrepreneurPulse.com.au  Fact Sheet for Healthcare Providers:  IncredibleEmployment.be  This test is no t yet approved or cleared by the [place] FDA and  has been authorized for detection and/or diagnosis  of SARS-CoV-2 by FDA under an Emergency Use Authorization (EUA). This EUA will remain  in effect (meaning this test can be used) for the duration of the COVID-19 declaration under Section 564(b)(1) of the Act, 21 U.S.C.section 360bbb-3(b)(1), unless the authorization is terminated  or revoked sooner.       Influenza A by PCR NEGATIVE NEGATIVE Final   Influenza B by PCR NEGATIVE NEGATIVE Final    Comment: (NOTE) The Xpert Xpress SARS-CoV-2/FLU/RSV plus assay is intended as an aid in the diagnosis of influenza from Nasopharyngeal swab specimens and should not be used as a sole basis for treatment. Nasal washings and aspirates are unacceptable for Xpert Xpress SARS-CoV-2/FLU/RSV testing.  Fact Sheet for Patients: EntrepreneurPulse.com.au  Fact Sheet for Healthcare Providers: IncredibleEmployment.be  This test is not yet approved or cleared by the [place] FDA and has been authorized for detection and/or diagnosis of SARS-CoV-2 by FDA under an Emergency Use Authorization (EUA). This EUA will remain in effect (meaning this test can be used) for the duration of the COVID-19 declaration under Section 564(b)(1) of the Act, 21 U.S.C. section 360bbb-3(b)(1), unless the authorization is terminated or revoked.  Performed at [facility], [place] [address], [place], [place] [address]     Procedures and diagnostic studies:  DG Chest Portable 1 View  Result Date: 03/31/2021 CLINICAL DATA:  Shortness of breath. EXAM: PORTABLE CHEST 1 VIEW COMPARISON:  02/05/2021 FINDINGS: 1223 hours. The cardio pericardial silhouette is enlarged. There is pulmonary vascular congestion suspected interstitial edema. The visualized bony structures of the thorax show no acute abnormality. Telemetry leads overlie the chest. IMPRESSION: Enlarged cardiopericardial silhouette with pulmonary vascular congestion and probable interstitial edema. Electronically Signed   By:  Misty Stanley M.D.   On: 03/31/2021 12:32    Medications:    allopurinol  100 mg Oral Daily   Chlorhexidine Gluconate Cloth  6 each Topical Q0600   ferric citrate  420 mg Oral BID WC   folic acid  1 mg Oral Daily   heparin  5,000 Units Subcutaneous Q8H   heparin sodium (porcine)       tamsulosin  0.4 mg Oral QPC supper   torsemide  20 mg Oral Daily   Continuous Infusions:  albumin human       LOS: 0  days   [place] Hospitalists   How to contact the [facility] Attending or Consulting provider [place] or covering provider during after hours [place], for this patient?  Check the care team in [facility] and look for a) attending/consulting [facility] provider listed and b) the [facility] team listed Log into [email or web address] and use [place]'s universal password to access. If you do not have the password, please contact the hospital operator. Locate the [facility] provider you are looking for under [facility] and page to a number that you can be directly reached. If you still have difficulty reaching the provider, please page the [facility] (Director on Call) for the Hospitalists listed on amion for assistance.  04/01/2021, 12:12 PM

## 2021-04-01 NOTE — Progress Notes (Signed)
OT Cancellation Note  Patient Details Name: [name] Colon MRN: [id number] DOB: 09/25/1940   Cancelled Treatment:    Reason Eval/Treat Not Completed: Patient at procedure or test/ unavailable.  Will reattempt.  Nilsa Nutting., OTR/L Acute Rehabilitation Services Pager [phone] Office [phone]   Lucille Passy M 04/01/2021, 9:26 AM

## 2021-04-01 NOTE — Progress Notes (Signed)
PT Cancellation Note  Patient Details Name: [name] Colon MRN: [id number] DOB: [birth year]   Cancelled Treatment:    Reason Eval/Treat Not Completed: Patient at procedure or test/unavailable   Wyona Almas, PT, DPT Acute Rehabilitation Services Pager [phone] Office [phone]    Deno Etienne 04/01/2021, 8:52 AM

## 2021-04-01 NOTE — Progress Notes (Signed)
Subjective:  Seen on HD-  had a terrible night because up all night in the ER-  no bed assignment yet Objective Vital signs in last 24 hours: Vitals:   04/01/21 0800 04/01/21 0830 04/01/21 0900 04/01/21 0930  BP: 118/68 122/67 109/67 113/68  Pulse: 85 85 80 80  Resp:      Temp:      TempSrc:      SpO2:      Weight:      Height:       Weight change:   Intake/Output Summary (Last 24 hours) at 04/01/2021 0955 Last data filed at 03/31/2021 1152 Gross per 24 hour  Intake 0 ml  Output 0 ml  Net 0 ml   Dialyzes at GO  MWF 4 hours   EDW 64.2. HD Bath 2/2, Dialyzer opti 180, Heparin yes-   2400 units. Access AVF-  15 gauge 400 BFR.  No meds-  hgb over 11 on 12/21-  calc 10.1 with PTH 60-  last there on 12/23-  ran short-  post weight unknown   Assessment/Plan: 80 year old male with multiple medical issues including ESRD-  now with what appears to be mild volume overload  1 SOB-  has newly diagnosed COPD and also evidence of some volume on CXR-  sats 100 % on 2 liters -  will UF as able with HD  today -  removed 2800 so far-  pt reports little symptomatic relief 2 ESRD: has been on HD about 10 months-  really failing to thrive unfortunately-  routine treatment today off schedule -  possibly next for Thursday 3 Hypertension/vol: no peripheral edema but some pulm edema-  UF as able-  not on any BP meds except flomax  4. Anemia of ESRD: no meds as OP-  hgb here over 11-  no need for meds 5. Metabolic Bone Disease: PTH low and calc high-  no vit d or sensipar as OP-  is listed as being on [place]-  will not start right away given some mild GI complaints 6.  FTT-  unfortunately seems to be an issue-  ? Followed by palliative as OP?  Not sure we will find a lot of reversibility here      [name] Colon    Labs: Basic Metabolic Panel: Recent Labs  Lab 03/31/21 1237 04/01/21 0450  NA 137 139  K 5.1 5.0  CL 97* 99  CO2 27 24  GLUCOSE 111* 93  BUN 59* 70*  CREATININE 9.54*  10.65*  CALCIUM 9.6 9.5   Liver Function Tests: Recent Labs  Lab 03/31/21 1237  AST 28  ALT 40  ALKPHOS 113  BILITOT 1.3*  PROT 7.0  ALBUMIN 2.9*   Recent Labs  Lab 03/31/21 1237  LIPASE 53*   No results for input(s): AMMONIA in the last 168 hours. CBC: Recent Labs  Lab 03/31/21 1237 04/01/21 0450  WBC 6.1 5.6  NEUTROABS 3.9  --   HGB 11.4* 11.4*  HCT 34.7* 34.4*  MCV 95.9 95.8  PLT 97* 101*   Cardiac Enzymes: No results for input(s): CKTOTAL, CKMB, CKMBINDEX, TROPONINI in the last 168 hours. CBG: No results for input(s): GLUCAP in the last 168 hours.  Iron Studies: No results for input(s): IRON, TIBC, TRANSFERRIN, FERRITIN in the last 72 hours. Studies/Results: DG Chest Portable 1 View  Result Date: 03/31/2021 CLINICAL DATA:  Shortness of breath. EXAM: PORTABLE CHEST 1 VIEW COMPARISON:  02/05/2021 FINDINGS: 1223 hours. The cardio pericardial silhouette is  enlarged. There is pulmonary vascular congestion suspected interstitial edema. The visualized bony structures of the thorax show no acute abnormality. Telemetry leads overlie the chest. IMPRESSION: Enlarged cardiopericardial silhouette with pulmonary vascular congestion and probable interstitial edema. Electronically Signed   By: Misty Stanley M.D.   On: 03/31/2021 12:32   Medications: Infusions:  albumin human      Scheduled Medications:  allopurinol  100 mg Oral Daily   Chlorhexidine Gluconate Cloth  6 each Topical Q0600   ferric citrate  420 mg Oral BID WC   folic acid  1 mg Oral Daily   heparin  5,000 Units Subcutaneous Q8H   heparin sodium (porcine)       tamsulosin  0.4 mg Oral QPC supper   torsemide  20 mg Oral Daily    have reviewed scheduled and prn medications.  Physical Exam: General:  NAD Heart: RRR Lungs: mostly clear Abdomen: soft, non tender Extremities: no peripheral edema Dialysis Access: left upper arm AVF-  accessed for HD     04/01/2021,9:55 AM  LOS: 0 days

## 2021-04-02 ENCOUNTER — Telehealth: Payer: Self-pay | Admitting: Hospice

## 2021-04-02 DIAGNOSIS — Z515 Encounter for palliative care: Secondary | ICD-10-CM

## 2021-04-02 DIAGNOSIS — R627 Adult failure to thrive: Secondary | ICD-10-CM | POA: Diagnosis not present

## 2021-04-02 DIAGNOSIS — N289 Disorder of kidney and ureter, unspecified: Secondary | ICD-10-CM | POA: Diagnosis not present

## 2021-04-02 DIAGNOSIS — E8770 Fluid overload, unspecified: Secondary | ICD-10-CM | POA: Diagnosis not present

## 2021-04-02 DIAGNOSIS — Z66 Do not resuscitate: Secondary | ICD-10-CM | POA: Diagnosis not present

## 2021-04-02 DIAGNOSIS — N186 End stage renal disease: Secondary | ICD-10-CM

## 2021-04-02 DIAGNOSIS — Z7189 Other specified counseling: Secondary | ICD-10-CM

## 2021-04-02 LAB — GLUCOSE, CAPILLARY
Glucose-Capillary: 71 mg/dL (ref 70–99)
Glucose-Capillary: 103 mg/dL — ABNORMAL HIGH (ref 70–99)

## 2021-04-02 MED ORDER — CHLORHEXIDINE GLUCONATE CLOTH 2 % EX PADS: 6.0000 | MEDICATED_PAD | Freq: Every day | CUTANEOUS | Status: DC

## 2021-04-02 NOTE — Progress Notes (Signed)
Progress Note    [name] Colon  NLZ:[id number] DOB: April 02, 1941  DOA: 03/31/2021 PCP: Leonel Ramsay, MD    Brief Narrative:     Medical records reviewed and are as summarized below:  [name] Colon is an 80 y.o. male with medical history significant of ESRD on HD; HTN; chronic hep C with cirrhosis; Jehovah's witness; chronic systolic CHF; and colon cancer presenting with weakness/SOB.  He was last hospitalized from 8/16-30 for CP and missed HD; he required BIPAP and a NTG drip and developed bradycardia requiring CPR.  He got up this AM to go to HD and he just couldn't go.  He felt so weak and bad.  He did not feel well yesterday, about the same.  This has been going on for about 3-4 months, since he has been on HD.  He is miserable on the days he doesn't do HD and lays around all day.  He called his family this AM and felt ok but felt so bad that he called back an hour later and told them he needed to come to the hospital.  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   DNR (do not resuscitate)   Failure to thrive in adult   Hypervolemia associated with renal insufficiency -Patient recently started ESRD and has been having generalized weakness and fatigue since starting it -He has been going to HD routinely but this has not improved at all -He is mildly SOB and may be somewhat volume overloaded -Patient on chronic MWF HD -Nephrology prn order set utilized -s/p HD -palliative care consult   Failure to thrive in adult- (present on admission) -Patient reports progressive decline despite HD - he feels worse on HD than he did before starting -Will observe with PT/OT/nutrition evaluations -He may benefit from SNF  placement (currently in ALF for respite) -He is followed (q34mo) as an outpatient by palliative care -palliative care consult   DNR (do not resuscitate)- (present on admission) -I have discussed code status with the patient and his son and  they are in agreement that the patient would not desire resuscitation and would prefer to die a natural death should that situation arise. -GOC as patient does not seem to be doing well with HD   Refusal of blood transfusions as patient is Jehovah's Witness -Hgb appears to be stable at this time so symptomatic anemia does not appear to be the cause of his fatigue/weakness -Regardless, he would not accept blood products   Chronic systolic (congestive) heart failure ([place]) -Echo in 11/2020 showed EF 45-50% and grade 1 diastolic dysfunction -He may have mild volume overload currently but this is controlled with HD -Continue Demadex   Hypertension- (present on admission) -No longer taking BP medications -Continue Demadex   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[place])- (present on admission) -Remote, no longer followed due to "cure" in 2014   Colon cancer [facility])- (present on admission) -h/o but remote/stable     Family Communication/Anticipated D/C date and plan/Code Status   DVT prophylaxis: Lovenox ordered. Code Status: DNR Disposition Plan: Status is: Observation Had to LM for son. [name]  The patient will require care spanning > 2 midnights and should be moved to inpatient because:  Medical Consultants:   Renal Palliative care  Subjective:  Is thinking about stopping HD Family concerned about him going back to facility he is at and needing extra care  Objective:    Vitals:   04/01/21 1717 04/01/21 2054 04/02/21 0450 04/02/21 0928  BP: 94/63 101/65 111/64 (!) 101/56  Pulse: 88 88 83 73  Resp: 15 18 18 17   Temp: (!) 97.3 F (36.3 C) 97.8 F (36.6 C) (!) 97.5 F (36.4 C) 98 F (36.7 C)  TempSrc: Oral     SpO2: 100% 100%  100% 100%  Weight:  62.9 kg    Height:        Intake/Output Summary (Last 24 hours) at 04/02/2021 1442 Last data filed at 04/02/2021 1300 Gross per 24 hour  Intake 960 ml  Output --  Net 960 ml   Filed Weights   04/01/21 1100 04/01/21 1136 04/01/21 2054  Weight: 58.8 kg 58.8 kg 62.9 kg    Exam:   General: Appearance:    elderly male in no acute distress     Lungs:     respirations unlabored  Heart:    Normal heart rate.    MS:   All extremities are intact.    Neurologic:   Awake, alert       Data Reviewed:   I have personally reviewed following labs and imaging studies:  Labs: Labs show the following:   Basic Metabolic Panel: Recent Labs  Lab 03/31/21 1237 04/01/21 0450  NA 137 139  K 5.1 5.0  CL 97* 99  CO2 27 24  GLUCOSE 111* 93  BUN 59* 70*  CREATININE 9.54* 10.65*  CALCIUM 9.6 9.5   GFR Estimated Creatinine Clearance: 4.8 mL/min (A) (by C-G formula based on SCr of 10.65 mg/dL (H)). Liver Function Tests: Recent Labs  Lab 03/31/21 1237  AST 28  ALT 40  ALKPHOS 113  BILITOT 1.3*  PROT 7.0  ALBUMIN 2.9*   Recent Labs  Lab 03/31/21 1237  LIPASE 53*   No results for input(s): AMMONIA in the last 168 hours. Coagulation profile No results for input(s): INR, PROTIME in the last 168 hours.  CBC: Recent Labs  Lab 03/31/21 1237 04/01/21 0450  WBC 6.1 5.6  NEUTROABS 3.9  --   HGB 11.4* 11.4*  HCT 34.7* 34.4*  MCV 95.9 95.8  PLT 97* 101*   Cardiac Enzymes: No results for input(s): CKTOTAL, CKMB, CKMBINDEX, TROPONINI in the last 168 hours. BNP (last 3 results) No results for input(s): PROBNP in the last 8760 hours. CBG: Recent Labs  Lab 04/01/21 2055 04/02/21 0739 04/02/21 1126  GLUCAP 100* 71 103*   D-Dimer: No results for input(s): DDIMER in the last 72 hours. Hgb A1c: No results for input(s): HGBA1C in the last 72 hours. Lipid Profile: No results for input(s): CHOL, HDL, LDLCALC, TRIG, CHOLHDL, LDLDIRECT in the last 72  hours. Thyroid function studies: No results for input(s): TSH, T4TOTAL, T3FREE, THYROIDAB in the last 72 hours.  Invalid input(s): FREET3 Anemia work up: No results for input(s): VITAMINB12, FOLATE, FERRITIN, TIBC, IRON, RETICCTPCT in the last 72 hours. Sepsis Labs: Recent Labs  Lab 03/31/21 1237 04/01/21 0450  WBC 6.1 5.6    Microbiology Recent Results (from the past 240 hour(s))  Resp Panel by RT-PCR (Flu A&B, Covid) Nasopharyngeal Swab     Status: None   Collection Time: 03/31/21 12:08 PM   Specimen: Nasopharyngeal Swab; Nasopharyngeal(NP) swabs in vial transport medium  Result Value Ref Range Status  SARS Coronavirus 2 by RT PCR NEGATIVE NEGATIVE Final    Comment: (NOTE) SARS-CoV-2 target nucleic acids are NOT DETECTED.  The SARS-CoV-2 RNA is generally detectable in upper respiratory specimens during the acute phase of infection. The lowest concentration of SARS-CoV-2 viral copies this assay can detect is 138 copies/mL. A negative result does not preclude SARS-Cov-2 infection and should not be used as the sole basis for treatment or other patient management decisions. A negative result may occur with  improper specimen collection/handling, submission of specimen other than nasopharyngeal swab, presence of viral mutation(s) within the areas targeted by this assay, and inadequate number of viral copies(<138 copies/mL). A negative result must be combined with clinical observations, patient history, and epidemiological information. The expected result is Negative.  Fact Sheet for Patients:  EntrepreneurPulse.com.au  Fact Sheet for Healthcare Providers:  IncredibleEmployment.be  This test is no t yet approved or cleared by the [place] FDA and  has been authorized for detection and/or diagnosis of SARS-CoV-2 by FDA under an Emergency Use Authorization (EUA). This EUA will remain  in effect (meaning this test can be used) for the  duration of the COVID-19 declaration under Section 564(b)(1) of the Act, 21 U.S.C.section 360bbb-3(b)(1), unless the authorization is terminated  or revoked sooner.       Influenza A by PCR NEGATIVE NEGATIVE Final   Influenza B by PCR NEGATIVE NEGATIVE Final    Comment: (NOTE) The Xpert Xpress SARS-CoV-2/FLU/RSV plus assay is intended as an aid in the diagnosis of influenza from Nasopharyngeal swab specimens and should not be used as a sole basis for treatment. Nasal washings and aspirates are unacceptable for Xpert Xpress SARS-CoV-2/FLU/RSV testing.  Fact Sheet for Patients: EntrepreneurPulse.com.au  Fact Sheet for Healthcare Providers: IncredibleEmployment.be  This test is not yet approved or cleared by the [place] FDA and has been authorized for detection and/or diagnosis of SARS-CoV-2 by FDA under an Emergency Use Authorization (EUA). This EUA will remain in effect (meaning this test can be used) for the duration of the COVID-19 declaration under Section 564(b)(1) of the Act, 21 U.S.C. section 360bbb-3(b)(1), unless the authorization is terminated or revoked.  Performed at [facility], [place] [address], [place], [place] [address]     Procedures and diagnostic studies:  No results found.  Medications:    allopurinol  100 mg Oral Daily   Chlorhexidine Gluconate Cloth  6 each Topical Q0600   ferric citrate  420 mg Oral BID WC   folic acid  1 mg Oral Daily   heparin  5,000 Units Subcutaneous Q8H   tamsulosin  0.4 mg Oral QPC supper   torsemide  20 mg Oral Daily   Continuous Infusions:     LOS: 0 days   Geradine Girt  [facility]   How to contact the [facility] Attending or Consulting provider [place] or covering provider during after hours [place], for this patient?  Check the care team in [facility] and look for a) attending/consulting TRH provider listed and b) the [facility] team listed Log into [email or web address] and use [facility]'s universal password to access. If you do not have the password, please contact the hospital operator. Locate the [facility] provider you are looking for under [facility] and page to a number that you can be directly reached. If you still have difficulty reaching the provider, please page the [facility] (Director on Call) for the Hospitalists listed on amion for assistance.  04/02/2021, 2:42 PM

## 2021-04-02 NOTE — Consult Note (Signed)
Consultation Note Date: 04/02/2021   Patient Name: [name] Colon  DOB: 1940-08-24  MRN: [id number]  Age / Sex: 80 y.o., male  PCP: Leonel Ramsay, MD Referring Physician: Geradine Girt, DO  Reason for Consultation: Establishing goals of care  HPI/Patient Profile: 80 y.o. male  with past medical history of  ESRD on HD; HTN; chronic hep C with cirrhosis; Jehovah's witness; chronic systolic CHF; and colon cancer admitted on 03/31/2021 with weakness and SOB. Patient diagnosed with hypervolemia associated with renal insufficiency and adult failure to thrive. Patient reports not feeling well for last 3-4 months and is considering stopping HD. PMT consulted to assist with [place] discussions.   Clinical Assessment and Goals of Care: I have reviewed medical records including EPIC notes, labs and imaging, assessed the patient and then met with patient, son [name], and DIL  to discuss diagnosis prognosis, GOC, EOL wishes, disposition and options.  I introduced Palliative Medicine as specialized medical care for people living with serious illness. It focuses on providing relief from the symptoms and stress of a serious illness. The goal is to improve quality of life for both the patient and the family.  We discuss patient's recent stay at [facility] for the past month while his family was out of town and with some illness. Patient tells me he has been getting weaker and having difficulty ambulation. Tells me he has been eating "okay". Tells me he generally does not feel well and believes it is d/t HD.   Patient tells me of his desire to potentially stop HD. He tells me he discussed this earlier with Dr. Moshe Cipro and she recommended taking 24 hours to consider. He agrees with this. He tells me he understands time will be short if he does stop HD. We discuss expected prognosis of 1-2 weeks.    Natural disease trajectory and expectations at EOL were  discussed. We discuss generally patients with renal failure become sleeper and sleepier until they sleep and do not wake up. We also discuss medications used to ensure comfort if patient does have any uncomfortable symptoms.   We discuss where to go if he does decide to stop HD. His son asks if rehab would be appropriate and we discuss that it would not be. Discussed that hospice facility would be most appropriate vs hospice care at home. Patient and family seem interested in hospice facility. Discussed type of care provided at hospice facility.   I attempted to elicit values and goals of care important to the patient.  He tells me it is most important to him to be comfortable.   Son shares with me that his family is supportive of whatever decision the patient makes.   Patient tells me he is currently comfortable - no pain, no shortness of breath.   Questions and concerns were addressed. The family was encouraged to call with questions or concerns.    Primary Decision Maker PATIENT    SUMMARY OF RECOMMENDATIONS   - patient interested in stopping HD - to decide in next 24 hours - understands prognosis and expectations - discussed hospice facility transfer if he decides to dc HD - patient and family have my contact info - they will call me  with decision/I will f/u tomorrow and we will proceed with hospice referral if aligns with wishes  Code Status/Advance Care Planning: DNR     Primary Diagnoses: Present on Admission:  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[place])  Colon cancer ([place])  Hypertension  DNR (do not resuscitate)  Failure to thrive in adult   I have reviewed the medical record, interviewed the patient and family, and examined the patient. The following aspects are pertinent.  Past Medical History:  Diagnosis Date   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HCC)    Chronic systolic (congestive) heart failure (HCC)    Colon cancer (HCC)    ESRD (end stage renal disease) on  dialysis ([place])    Hypertension    Refusal of blood transfusions as patient is Jehovah's Witness    Social History   Socioeconomic History   Marital status: Widowed    Spouse name: Not on file   Number of children: Not on file   Years of education: Not on file   Highest education level: Not on file  Occupational History   Not on file  Tobacco Use   Smoking status: Former    Types: Cigarettes    Quit date: 37    Years since quitting: 38.0   Smokeless tobacco: Never  Vaping Use   Vaping Use: Never used  Substance and Sexual Activity   Alcohol use: Not Currently   Drug use: Not Currently   Sexual activity: Not Currently  Other Topics Concern   Not on file  Social History Narrative   Not on file   Social Determinants of Health   Financial Resource Strain: Not on file  Food Insecurity: Not on file  Transportation Needs: Not on file  Physical Activity: Not on file  Stress: Not on file  Social Connections: Not on file   History reviewed. No pertinent family history. Scheduled Meds:  allopurinol  100 mg Oral Daily   Chlorhexidine Gluconate Cloth  6 each Topical Q0600   ferric citrate  420 mg Oral BID WC   folic acid  1 mg Oral Daily   heparin  5,000 Units Subcutaneous Q8H   tamsulosin  0.4 mg Oral QPC supper   torsemide  20 mg Oral Daily   Continuous Infusions: PRN Meds:.acetaminophen **OR** acetaminophen, calcium carbonate (dosed in mg elemental calcium), camphor-menthol **AND** hydrOXYzine, docusate sodium, feeding supplement (NEPRO CARB STEADY), heparin, hydrALAZINE, ondansetron **OR** ondansetron (ZOFRAN) IV, sorbitol, zolpidem No Known Allergies Review of Systems  Constitutional:  Positive for activity change and fatigue.  Neurological:  Positive for weakness.   Physical Exam Constitutional:      General: He is not in acute distress. Pulmonary:     Effort: Pulmonary effort is normal.  Skin:    General: Skin is warm and dry.  Neurological:     Mental  Status: He is alert and oriented to person, place, and time.  Psychiatric:        Mood and Affect: Mood normal.        Behavior: Behavior normal.    Vital Signs: BP (!) 101/56    Pulse 73    Temp 98 F (36.7 C)    Resp 17    Ht 5' 8" (1.727 m)    Wt 62.9 kg    SpO2 100%    BMI 21.08 kg/m  Pain Scale: 0-10   Pain Score: Asleep   SpO2: SpO2: 100 % O2 Device:SpO2: 100 % O2 Flow Rate: .   IO: Intake/output summary:  Intake/Output Summary (Last 24 hours) at 04/02/2021 1516 Last data filed at 04/02/2021 1300 Gross per 24 hour  Intake 960 ml  Output --  Net 960 ml    LBM:   Baseline Weight: Weight: 65.3 kg Most recent weight: Weight: 62.9 kg     Palliative Assessment/Data: PPS 50%    Time Total: 60 minutes Greater than 50%  of this time was spent counseling and coordinating care related to the above assessment and plan.  Juel Burrow, DNP, AGNP-C Palliative Medicine Team [phone] Pager: [phone]

## 2021-04-02 NOTE — Progress Notes (Signed)
OT Cancellation Note  Patient Details Name: [name] Colon MRN: [id number] DOB: 03-09-1941   Cancelled Treatment:    Reason Eval/Treat Not Completed: Patient declined, no reason specified. Pt reported that he has spoken with his doctor and has decided to stop dialysis. He stated that he does not feel like he has much time left and does not see the point in getting up with therapy.   Alicyn Klann H., OTR/L Acute Rehabilitation  Rosmarie Esquibel Elane Yolanda Bonine 04/02/2021, 3:48 PM

## 2021-04-02 NOTE — Progress Notes (Signed)
Subjective:   HD yest-   removed 3000 tolerated well hemodynamically -  is now DNR.  Had a heart to heart conversation with him today -  daughter on phone and son in law in room-  he says he has been thinking for quite some time that he wants to sign off of HD but did not want to make a hasty decision-  his reasoning seems to be sound-  has not improved on dialysis for 10 months -  feels like he is getting worse-  does not want to be a burden -  does not want to go to SNF  Objective Vital signs in last 24 hours: Vitals:   04/01/21 1717 04/01/21 2054 04/02/21 0450 04/02/21 0928  BP: 94/63 101/65 111/64 (!) 101/56  Pulse: 88 88 83 73  Resp: 15 18 18 17   Temp: (!) 97.3 F (36.3 C) 97.8 F (36.6 C) (!) 97.5 F (36.4 C) 98 F (36.7 C)  TempSrc: Oral     SpO2: 100% 100% 100% 100%  Weight:  62.9 kg    Height:       Weight change: -6.518 kg  Intake/Output Summary (Last 24 hours) at 04/02/2021 1236 Last data filed at 04/02/2021 0900 Gross per 24 hour  Intake 720 ml  Output --  Net 720 ml   Dialyzes at [facility]  MWF 4 hours   EDW 64.2. HD Bath 2/2, Dialyzer opti 180, Heparin yes-   2400 units. Access AVF-  15 gauge 400 BFR.  No meds-  hgb over 11 on 12/21-  calc 10.1 with PTH 60-  last there on 12/23-  ran short-  post weight unknown   Assessment/Plan: 80 year old male with multiple medical issues including ESRD-  now with what appears to be mild volume overload and chronic FTT 1 SOB-  has newly diagnosed COPD and also evidence of some volume on CXR upon admission-  sats 100 % on 2 liters -  UF 3 liters which is a fair amount yesterday -  SOB is improved 2 ESRD: has been on HD about 10 months-  really failing to thrive unfortunately-  routine treatment today off schedule -  possibly next for Thursday.  He says he is ready to make the decision to stop dialysis -  palliative care consult is pending-  I told him it is not binding-  will re eval tomorrow-  will put in orders for HD tomorrow in case he  changes his mind or wants one more treatment 3 Hypertension/vol: no peripheral edema but some pulm edema-  UF as able-  not on any BP meds except flomax  4. Anemia of ESRD: no meds as OP-  hgb here over 11-  no need for meds 5. Metabolic Bone Disease: PTH low and calc high-  no vit d or sensipar as OP-  is listed as being on [place]-  will not start right away given some mild GI complaints 6.  FTT-  see conversations above-  he seems serious in his request and is reasonable for his situation-  family seems supportive as well -  palliative consult pending      Louis Meckel    Labs: Basic Metabolic Panel: Recent Labs  Lab 03/31/21 1237 04/01/21 0450  NA 137 139  K 5.1 5.0  CL 97* 99  CO2 27 24  GLUCOSE 111* 93  BUN 59* 70*  CREATININE 9.54* 10.65*  CALCIUM 9.6 9.5   Liver Function Tests: Recent Labs  Lab 03/31/21 1237  AST 28  ALT 40  ALKPHOS 113  BILITOT 1.3*  PROT 7.0  ALBUMIN 2.9*   Recent Labs  Lab 03/31/21 1237  LIPASE 53*   No results for input(s): AMMONIA in the last 168 hours. CBC: Recent Labs  Lab 03/31/21 1237 04/01/21 0450  WBC 6.1 5.6  NEUTROABS 3.9  --   HGB 11.4* 11.4*  HCT 34.7* 34.4*  MCV 95.9 95.8  PLT 97* 101*   Cardiac Enzymes: No results for input(s): CKTOTAL, CKMB, CKMBINDEX, TROPONINI in the last 168 hours. CBG: Recent Labs  Lab 04/01/21 2055 04/02/21 0739 04/02/21 1126  GLUCAP 100* 71 103*    Iron Studies: No results for input(s): IRON, TIBC, TRANSFERRIN, FERRITIN in the last 72 hours. Studies/Results: No results found. Medications: Infusions:    Scheduled Medications:  allopurinol  100 mg Oral Daily   Chlorhexidine Gluconate Cloth  6 each Topical Q0600   ferric citrate  420 mg Oral BID WC   folic acid  1 mg Oral Daily   heparin  5,000 Units Subcutaneous Q8H   tamsulosin  0.4 mg Oral QPC supper   torsemide  20 mg Oral Daily    have reviewed scheduled and prn medications.  Physical Exam: General:  NAD-   very clear  Heart: RRR Lungs: mostly clear Abdomen: soft, non tender Extremities: no peripheral edema Dialysis Access: left upper arm AVF-     04/02/2021,12:36 PM  LOS: 0 days

## 2021-04-02 NOTE — TOC Progression Note (Signed)
Transition of Care [facility]) - Progression Note    Patient Details  Name: [name] Colon MRN: [id number] Date of Birth: 03/12/1941  Transition of Care [facility]) CM/SW [place], [place] Phone Number: 04/02/2021, 4:59 PM  Clinical Narrative:    CSW spoke with pt daughter about pt returning to her facility with respite care, pt daughter stated the respite care is temporary and they are waiting for the pt to decide if he wants to continue HD before they make any further decisions.        Expected Discharge Plan and Services                                                 Social Determinants of Health (SDOH) Interventions    Readmission Risk Interventions Readmission Risk Prevention Plan 11/26/2020  Transportation Screening Complete  HRI or [place] Complete  Social Work Consult for [place] Planning/Counseling Complete  Palliative Care Screening Not Applicable  Medication Review Press photographer) Complete

## 2021-04-02 NOTE — NC FL2 (Addendum)
[place] LEVEL OF CARE SCREENING TOOL     IDENTIFICATION  Patient Name: [name] Colon Birthdate: 1940-04-18 Sex: male Admission Date (Current Location): 03/31/2021  [facility] and [place] Number:  Herbalist and Address:  [facility] [place], [employer] [address] [place], [place] [address]      Provider Number: [id number]  Attending Physician Name and Address:  Geradine Girt, DO  Relative Name and Phone Number:  Thamas Jaegers, Daughter   [phone]    Current Level of Care: Hospital Recommended Level of Care: [place] Prior Approval Number:    Date Approved/Denied:   PASRR Number: [id number]  Discharge Plan: SNF    Current Diagnoses: Patient Active Problem List   Diagnosis Date Noted   Hypervolemia associated with renal insufficiency 78/93/8101   Chronic systolic (congestive) heart failure ([place]) 03/31/2021   Refusal of blood transfusions as patient is Jehovah's Witness 03/31/2021   DNR (do not resuscitate) 03/31/2021   Failure to thrive in adult 03/31/2021   Mallory-Weiss tear    Volume overload 11/20/2020   ESRD (end stage renal disease) on dialysis ([place]) 11/20/2020   Colon cancer ([place]) 11/20/2020   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[place]) 11/20/2020   Hypertension 11/20/2020   Vasovagal episode 11/20/2020   Hematemesis 11/20/2020   Acute on chronic combined systolic and diastolic CHF (congestive heart failure) ([place]) 11/20/2020   Acute pulmonary edema (HCC)     Orientation RESPIRATION BLADDER Height & Weight     Self, Time, Situation, Place  Normal Continent Weight: 138 lb 10.7 oz (62.9 kg) Height:  5\' 8"  (172.7 cm)  BEHAVIORAL SYMPTOMS/MOOD NEUROLOGICAL BOWEL NUTRITION STATUS      Continent Diet (Please see DC Summary)  AMBULATORY STATUS COMMUNICATION OF NEEDS Skin   Limited Assist Verbally Normal                       Personal Care Assistance Level of Assistance  Bathing, Feeding, Dressing  Bathing Assistance: Limited assistance Feeding assistance: Limited assistance Dressing Assistance: Limited assistance     Functional Limitations Info  Sight, Hearing, Speech Sight Info: Adequate Hearing Info: Adequate Speech Info: Adequate    SPECIAL CARE FACTORS FREQUENCY  PT (By licensed PT), OT (By licensed OT)     PT Frequency: 5x/week OT Frequency: 5x/week            Contractures Contractures Info: Not present    Additional Factors Info  Code Status, Allergies Code Status Info: DNR Allergies Info: No Known Allergies           Current Medications (04/02/2021):  This is the current hospital active medication list Current Facility-Administered Medications  Medication Dose Route Frequency Provider Last Rate Last Admin   acetaminophen (TYLENOL) tablet 650 mg  650 mg Oral Q6H PRN Karmen Bongo, MD       Or   acetaminophen (TYLENOL) suppository 650 mg  650 mg Rectal Q6H PRN Karmen Bongo, MD       allopurinol (ZYLOPRIM) tablet 100 mg  100 mg Oral Daily Karmen Bongo, MD   100 mg at 04/02/21 7510   calcium carbonate (dosed in mg elemental calcium) suspension 500 mg of elemental calcium  500 mg of elemental calcium Oral Q6H PRN Karmen Bongo, MD       camphor-menthol [facility]) lotion 1 application  1 application Topical C5E PRN Karmen Bongo, MD       And   hydrOXYzine (ATARAX) tablet 25 mg  25  mg Oral Q8H PRN Karmen Bongo, MD       Chlorhexidine Gluconate Cloth 2 % PADS 6 each  6 each Topical Q0600 Karmen Bongo, MD   6 each at 04/01/21 1445   docusate sodium (ENEMEEZ) enema 283 mg  1 enema Rectal PRN Karmen Bongo, MD       feeding supplement (NEPRO CARB STEADY) liquid 237 mL  237 mL Oral TID PRN Karmen Bongo, MD       ferric citrate (AURYXIA) tablet 420 mg  420 mg Oral BID WC Karmen Bongo, MD   420 mg at 87/86/76 7209   folic acid (FOLVITE) tablet 1 mg  1 mg Oral Daily Karmen Bongo, MD   1 mg at 04/02/21 4709   heparin injection 1,300 Units  20  Units/kg Dialysis PRN Corliss Parish, MD   1,300 Units at 04/01/21 0714   heparin injection 5,000 Units  5,000 Units Subcutaneous Lynne Logan, MD   5,000 Units at 04/02/21 6283   hydrALAZINE (APRESOLINE) injection 5 mg  5 mg Intravenous Q4H PRN Karmen Bongo, MD       ondansetron [facility]) tablet 4 mg  4 mg Oral Q6H PRN Karmen Bongo, MD       Or   ondansetron [facility]) injection 4 mg  4 mg Intravenous Q6H PRN Karmen Bongo, MD       sorbitol 70 % solution 30 mL  30 mL Oral PRN Karmen Bongo, MD       tamsulosin [facility]) capsule 0.4 mg  0.4 mg Oral QPC supper Karmen Bongo, MD   0.4 mg at 04/01/21 1831   torsemide (DEMADEX) tablet 20 mg  20 mg Oral Daily Karmen Bongo, MD   20 mg at 04/02/21 6629   zolpidem (AMBIEN) tablet 5 mg  5 mg Oral QHS PRN Karmen Bongo, MD         Discharge Medications: Please see discharge summary for a list of discharge medications.  Relevant Imaging Results:  Relevant Lab Results:   Additional Information SSN#: [id number]  TWSFKCL EXNT, LCSW

## 2021-04-02 NOTE — Telephone Encounter (Signed)
As requested, NP called and spoke with [name]. She updated that patient is currently in the hospital for hypervolemia related to ESRD. Patient contemplating to stop dialysis and family considering rehab placement with hospital discharge. Therapeutic listening provided. After discussion, [name] reports patient likely to continue with dialysis. Validation and ample emotional support provided.

## 2021-04-03 DIAGNOSIS — Z992 Dependence on renal dialysis: Secondary | ICD-10-CM | POA: Diagnosis not present

## 2021-04-03 DIAGNOSIS — N289 Disorder of kidney and ureter, unspecified: Secondary | ICD-10-CM | POA: Diagnosis not present

## 2021-04-03 DIAGNOSIS — I5022 Chronic systolic (congestive) heart failure: Secondary | ICD-10-CM | POA: Diagnosis present

## 2021-04-03 DIAGNOSIS — Z515 Encounter for palliative care: Secondary | ICD-10-CM | POA: Diagnosis not present

## 2021-04-03 DIAGNOSIS — R627 Adult failure to thrive: Secondary | ICD-10-CM | POA: Diagnosis present

## 2021-04-03 DIAGNOSIS — Z20822 Contact with and (suspected) exposure to covid-19: Secondary | ICD-10-CM | POA: Diagnosis present

## 2021-04-03 DIAGNOSIS — I132 Hypertensive heart and chronic kidney disease with heart failure and with stage 5 chronic kidney disease, or end stage renal disease: Secondary | ICD-10-CM | POA: Diagnosis present

## 2021-04-03 DIAGNOSIS — Z531 Procedure and treatment not carried out because of patient's decision for reasons of belief and group pressure: Secondary | ICD-10-CM | POA: Diagnosis present

## 2021-04-03 DIAGNOSIS — E8770 Fluid overload, unspecified: Secondary | ICD-10-CM | POA: Diagnosis present

## 2021-04-03 DIAGNOSIS — J449 Chronic obstructive pulmonary disease, unspecified: Secondary | ICD-10-CM | POA: Diagnosis present

## 2021-04-03 DIAGNOSIS — Z66 Do not resuscitate: Secondary | ICD-10-CM | POA: Diagnosis present

## 2021-04-03 DIAGNOSIS — Z87891 Personal history of nicotine dependence: Secondary | ICD-10-CM | POA: Diagnosis not present

## 2021-04-03 DIAGNOSIS — M898X9 Other specified disorders of bone, unspecified site: Secondary | ICD-10-CM | POA: Diagnosis present

## 2021-04-03 DIAGNOSIS — N186 End stage renal disease: Secondary | ICD-10-CM | POA: Diagnosis present

## 2021-04-03 DIAGNOSIS — K7460 Unspecified cirrhosis of liver: Secondary | ICD-10-CM | POA: Diagnosis present

## 2021-04-03 DIAGNOSIS — B182 Chronic viral hepatitis C: Secondary | ICD-10-CM | POA: Diagnosis present

## 2021-04-03 DIAGNOSIS — R0602 Shortness of breath: Secondary | ICD-10-CM | POA: Diagnosis present

## 2021-04-03 DIAGNOSIS — E8779 Other fluid overload: Secondary | ICD-10-CM | POA: Diagnosis present

## 2021-04-03 DIAGNOSIS — C189 Malignant neoplasm of colon, unspecified: Secondary | ICD-10-CM | POA: Diagnosis present

## 2021-04-03 DIAGNOSIS — D631 Anemia in chronic kidney disease: Secondary | ICD-10-CM | POA: Diagnosis present

## 2021-04-03 DIAGNOSIS — Z79899 Other long term (current) drug therapy: Secondary | ICD-10-CM | POA: Diagnosis not present

## 2021-04-03 LAB — GLUCOSE, CAPILLARY
Glucose-Capillary: 142 mg/dL — ABNORMAL HIGH (ref 70–99)
Glucose-Capillary: 145 mg/dL — ABNORMAL HIGH (ref 70–99)
Glucose-Capillary: 96 mg/dL (ref 70–99)
Glucose-Capillary: 129 mg/dL — ABNORMAL HIGH (ref 70–99)

## 2021-04-03 MED ORDER — OXYCODONE HCL 5 MG PO TABS: 5.0000 mg | ORAL_TABLET | ORAL | Status: DC | PRN

## 2021-04-03 MED ORDER — LORAZEPAM 0.5 MG PO TABS: 0.5000 mg | ORAL_TABLET | ORAL | 0 refills | Status: AC | PRN

## 2021-04-03 MED ORDER — OXYCODONE HCL 5 MG PO TABS: 5.0000 mg | ORAL_TABLET | ORAL | 0 refills | Status: AC | PRN

## 2021-04-03 MED ORDER — CAMPHOR-MENTHOL 0.5-0.5 % EX LOTN
1.0000 | TOPICAL_LOTION | Freq: Three times a day (TID) | CUTANEOUS | 0 refills | Status: AC | PRN
Start: 2021-04-03 — End: ?

## 2021-04-03 MED ORDER — LORAZEPAM 2 MG/ML IJ SOLN: 0.5000 mg | INTRAMUSCULAR | Status: DC | PRN

## 2021-04-03 MED ORDER — HYDROXYZINE HCL 25 MG PO TABS: 25.0000 mg | ORAL_TABLET | Freq: Three times a day (TID) | ORAL | 0 refills | Status: AC | PRN

## 2021-04-03 MED ORDER — BIOTENE DRY MOUTH MT LIQD: 15.0000 mL | OROMUCOSAL | Status: DC | PRN

## 2021-04-03 MED ORDER — LORAZEPAM 0.5 MG PO TABS: 0.5000 mg | ORAL_TABLET | ORAL | Status: DC | PRN

## 2021-04-03 MED ORDER — POLYVINYL ALCOHOL 1.4 % OP SOLN
1.0000 [drp] | Freq: Four times a day (QID) | OPHTHALMIC | Status: DC | PRN
  Filled 2021-04-03: qty 15

## 2021-04-03 NOTE — Progress Notes (Signed)
Note that pt is now requesting comfort/ hospice care.  No further dialysis.  Will sign off.   Kelly Splinter, MD 04/03/2021, 11:19 AM

## 2021-04-03 NOTE — TOC Transition Note (Signed)
Transition of Care [facility]) - CM/SW Discharge Note   Patient Details  Name: [name] Colon MRN: [id number] Date of Birth: March 11, 1941  Transition [facility] Care [facility]) CM/SW Contact:  Tom-Johnson, Renea Ee, RN Phone Number: 04/03/2021, 3:07 PM   Clinical Narrative:    Patient is scheduled for discharge to [facility] today. Transportation scheduled with [facility].  Patient and son, [name] Colon notified. No further TOC needs noted.   Final next level of care: [place] Barriers to Discharge: Barriers Resolved   Patient Goals and CMS Choice Patient states their goals for this hospitalization and ongoing recovery are:: To go to [facility].gov Compare Post Acute Care list provided to:: Patient Choice offered to / list presented to : Patient, Adult Children (Son, [name] Colon)  Discharge Placement              Patient chooses bed at: Other - please specify in the comment section below: ([place]) Patient to be transferred to facility by: [place] Name of family member notified: Levy Pupa, Son Patient and family notified of of transfer: 04/03/21  Discharge Plan and Services                DME Arranged: N/A DME Agency: NA       HH Arranged: NA HH Agency: NA        Social Determinants of Health (SDOH) Interventions     Readmission Risk Interventions Readmission Risk Prevention Plan 11/26/2020  Transportation Screening Complete  HRI or Home Care Consult Complete  Social Work Consult for [place] Planning/Counseling Complete  Palliative Care Screening Not Applicable  Medication Review Press photographer) Complete

## 2021-04-03 NOTE — Progress Notes (Addendum)
[facility]) Hospital Liaison note.   Received request from [place] for family interest in [facility] with request for transfer. Chart reviewed and eligibility confirmed. Met with patient and family to confirm interest and explain services. Family agreeable to transfer today. TOC aware. Unit RN please call report to [phone] prior to patient leaving the unit. Please send signed DNR and paperwork with patient.   TOC arrange transport once confirmation has been received that consents are complete. Please call with any questions.   [facility] Liaison [phone]

## 2021-04-03 NOTE — Evaluation (Signed)
Occupational Therapy Evaluation Patient Details Name: [name] Colon MRN: [id number] DOB: July 04, 1940 Today's Date: 04/03/2021   History of Present Illness Pt is a 80 y.o. M who presents 03/31/2021 with weakness/SOB. Significant PMH: ESRD on HD, HTN, chronic hep C with cirrhosis, Jehovah's witness, chronic systolic CHF, colon CA.   Clinical Impression   Pt lives at home with family and has PCA MWF 9-11:30 and assist from family 24/7. Pt had been staying at [facility] for respite care for one month while his family was on vacation. Pt presents with generalized weakness, impaired balance and impaired activity tolerance/endurance. Pt requires min - min guard A with LB selfcare and min guard A with mobility using RW. Per pt,  current plan is for pt to return to [facility] then home with family and possibly hospice care. All education has been completed and no further acute OT services are indicated at this time. OT will sign off     Recommendations for follow up therapy are one component of a multi-disciplinary discharge planning process, led by the attending physician.  Recommendations may be updated based on patient status, additional functional criteria and insurance authorization.   Follow Up Recommendations  No OT follow up    Assistance Recommended at Discharge Frequent or constant Supervision/Assistance  Functional Status Assessment  Patient has had a recent decline in their functional status and/or demonstrates limited ability to make significant improvements in function in a reasonable and predictable amount of time  Equipment Recommendations  BSC/3in1    Recommendations for Other Services       Precautions / Restrictions Precautions Precautions: Fall Restrictions Weight Bearing Restrictions: No      Mobility Bed Mobility Overal bed mobility: Needs Assistance Bed Mobility: Supine to Sit     Supine to sit: Supervision Sit to supine: Supervision   General bed  mobility comments: increased time, no physical assist required    Transfers Overall transfer level: Needs assistance Equipment used: Rolling walker (2 wheels) Transfers: Sit to/from Stand Sit to Stand: Min guard           General transfer comment: no physical assist required      Balance Overall balance assessment: Needs assistance Sitting-balance support: Feet supported Sitting balance-Leahy Scale: Fair     Standing balance support: Bilateral upper extremity supported;During functional activity Standing balance-Leahy Scale: Poor                             ADL either performed or assessed with clinical judgement   ADL Overall ADL's : Needs assistance/impaired Eating/Feeding: Independent;Sitting   Grooming: Wash/dry hands;Wash/dry face;Supervision/safety;Standing   Upper Body Bathing: Set up   Lower Body Bathing: Min guard   Upper Body Dressing : Set up   Lower Body Dressing: Min guard   Toilet Transfer: Min guard;Ambulation;Regular Toilet;Grab bars;Rolling walker (2 wheels)   Toileting- Clothing Manipulation and Hygiene: Supervision/safety;Sit to/from stand       Functional mobility during ADLs: Min guard;Rolling walker (2 wheels)       Vision Baseline Vision/History: 1 Wears glasses Ability to See in Adequate Light: 0 Adequate Patient Visual Report: No change from baseline       Perception     Praxis      Pertinent Vitals/Pain Pain Assessment: No/denies pain Faces Pain Scale: No hurt     Hand Dominance Right   Extremity/Trunk Assessment Upper Extremity Assessment Upper Extremity Assessment: Generalized weakness   Lower Extremity Assessment Lower Extremity  Assessment: Defer to PT evaluation   Cervical / Trunk Assessment Cervical / Trunk Assessment: Kyphotic   Communication Communication Communication: No difficulties   Cognition Arousal/Alertness: Awake/alert Behavior During Therapy: WFL for tasks  assessed/performed Overall Cognitive Status: Impaired/Different from baseline Area of Impairment: Memory                     Memory: Decreased short-term memory         General Comments: deficits noted in [facility]     General Comments       Exercises     Shoulder Instructions      Home Living Family/patient expects to be discharged to:: Skilled nursing facility (hospice?) Living Arrangements: Children Available Help at Discharge: Family;Available 24 hours/day Type of Home: House Home Access: Stairs to enter [employer] of Steps: 4 Entrance Stairs-Rails: Right;Left Home Layout: One level     Bathroom Shower/Tub: Teacher, early years/pre: Handicapped height Bathroom Accessibility: Yes   Home Equipment: Cane - single Barista (2 wheels);Rollator (4 wheels);Tub bench   Additional Comments: PCA MWF 9-11:30      Prior Functioning/Environment Prior Level of Function : Needs assist             Mobility Comments: uses Rollator ADLs Comments: PCA assists with ADL's, also picks up from dialysis        OT Problem List: Decreased strength;Decreased activity tolerance;Impaired balance (sitting and/or standing)      OT Treatment/Interventions:      OT Goals(Current goals can be found in the care plan section) Acute Rehab OT Goals Patient Stated Goal: go home  OT Frequency:     Barriers to D/C:            Co-evaluation              AM-PAC OT "6 Clicks" Daily Activity     Outcome Measure Help from another person eating meals?: None Help from another person taking care of personal grooming?: A Little Help from another person toileting, which includes using toliet, bedpan, or urinal?: None Help from another person bathing (including washing, rinsing, drying)?: A Little Help from another person to put on and taking off regular upper body clothing?: None Help from another person to put on and taking off regular lower  body clothing?: A Little 6 Click Score: 21   End of Session Equipment Utilized During Treatment: Rolling walker (2 wheels)  Activity Tolerance: Patient limited by fatigue Patient left: in bed;with call bell/phone within reach;with bed alarm set  OT Visit Diagnosis: Muscle weakness (generalized) (M62.81);Unsteadiness on feet (R26.81)                Time: 2025-4270 OT Time Calculation (min): 13 min Charges:  OT General Charges $OT Visit: 1 Visit OT Evaluation $OT Eval Moderate Complexity: 1 Mod    Britt Bottom 04/03/2021, 2:03 PM

## 2021-04-03 NOTE — Progress Notes (Signed)
Physical Therapy Treatment Patient Details Name: [name] Colon MRN: [id number] DOB: Jul 01, 1940 Today's Date: 04/03/2021   History of Present Illness Pt is a 80 y.o. M who presents 03/31/2021 with weakness/SOB. Significant PMH: ESRD on HD, HTN, chronic hep C with cirrhosis, Jehovah's witness, chronic systolic CHF, colon CA.    PT Comments    Pt making slow, steady progress with functional mobility and tolerated ambulating a further distance today as compared to previous session. However, he remains very limited overall secondary to generalized weakness and fatigue. Pt would continue to benefit from skilled physical therapy services at this time while admitted and after d/c to address the below listed limitations in order to improve overall safety and independence with functional mobility.    Recommendations for follow up therapy are one component of a multi-disciplinary discharge planning process, led by the attending physician.  Recommendations may be updated based on patient status, additional functional criteria and insurance authorization.  Follow Up Recommendations  Home health PT     Assistance Recommended at Discharge Frequent or constant Supervision/Assistance  Equipment Recommendations  BSC/3in1    Recommendations for Other Services       Precautions / Restrictions Precautions Precautions: Fall Restrictions Weight Bearing Restrictions: No     Mobility  Bed Mobility Overal bed mobility: Needs Assistance Bed Mobility: Supine to Sit     Supine to sit: Supervision     General bed mobility comments: increased time, no physical assist required    Transfers Overall transfer level: Needs assistance Equipment used: Rolling walker (2 wheels) Transfers: Sit to/from Stand Sit to Stand: Min guard;From elevated surface           General transfer comment: bed slightly elevated, min guard for safety, no physical assist required    Ambulation/Gait Ambulation/Gait  assistance: Min guard Gait Distance (Feet): 50 Feet Assistive device: Rolling walker (2 wheels) Gait Pattern/deviations: Step-through pattern;Decreased stride length;Trunk flexed;Decreased dorsiflexion - left;Shuffle Gait velocity: decreased     General Gait Details: pt with very slow, cautious gait with RW, mild instability noted but no overt LOB or need for physical assistance, min guard for safety   Stairs             Wheelchair Mobility    Modified Rankin (Stroke Patients Only)       Balance Overall balance assessment: Needs assistance Sitting-balance support: Feet supported Sitting balance-Leahy Scale: Fair     Standing balance support: Bilateral upper extremity supported Standing balance-Leahy Scale: Poor Standing balance comment: reliant on RW                            Cognition Arousal/Alertness: Awake/alert Behavior During Therapy: WFL for tasks assessed/performed Overall Cognitive Status: Impaired/Different from baseline Area of Impairment: Memory                     Memory: Decreased short-term memory         General Comments: deficits noted in [facility]        Exercises      General Comments        Pertinent Vitals/Pain Pain Assessment: No/denies pain    Home Living                          Prior Function            PT Goals (current goals can now be found in the care plan section)  Acute Rehab PT Goals PT Goal Formulation: With patient/family Time For Goal Achievement: 04/15/21 Potential to Achieve Goals: Fair Progress towards PT goals: Progressing toward goals    Frequency    Min 3X/week      PT Plan Current plan remains appropriate    Co-evaluation              AM-PAC PT "6 Clicks" Mobility   Outcome Measure  Help needed turning from your back to your side while in a flat bed without using bedrails?: None Help needed moving from lying on your back to sitting on the side of a flat  bed without using bedrails?: A Little Help needed moving to and from a bed to a chair (including a wheelchair)?: A Little Help needed standing up from a chair using your arms (e.g., wheelchair or bedside chair)?: A Little Help needed to walk in hospital room?: A Little Help needed climbing 3-5 steps with a railing? : A Lot 6 Click Score: 18    End of Session Equipment Utilized During Treatment: Gait belt Activity Tolerance: Patient limited by fatigue Patient left: in bed;with call bell/phone within reach;with family/visitor present;Other (comment) (seated upright at EOB) Nurse Communication: Mobility status PT Visit Diagnosis: Unsteadiness on feet (R26.81);Muscle weakness (generalized) (M62.81)     Time: 9166-0600 PT Time Calculation (min) (ACUTE ONLY): 11 min  Charges:  $Gait Training: 8-22 mins                     Anastasio Champion, DPT  Acute Rehabilitation Services Office [place] 04/03/2021, 10:08 AM

## 2021-04-03 NOTE — Progress Notes (Signed)
Daily Progress Note   Patient Name: [name] Colon       Date: 04/03/2021 DOB: 1941/02/16  Age: 80 y.o. MRN#: [id number] Attending Physician: Geradine Girt, DO Primary Care Physician: Leonel Ramsay, MD Admit Date: 03/31/2021  Reason for Consultation/Follow-up: Establishing goals of care  Subjective: No complaints, sitting up in bed watching TV.  Length of Stay: 0  Current Medications: Scheduled Meds:   allopurinol  100 mg Oral Daily   ferric citrate  420 mg Oral BID WC   folic acid  1 mg Oral Daily   tamsulosin  0.4 mg Oral QPC supper   torsemide  20 mg Oral Daily    Continuous Infusions:   PRN Meds: acetaminophen **OR** acetaminophen, antiseptic oral rinse, calcium carbonate (dosed in mg elemental calcium), camphor-menthol **AND** hydrOXYzine, docusate sodium, feeding supplement (NEPRO CARB STEADY), hydrALAZINE, LORazepam **OR** LORazepam, ondansetron **OR** ondansetron (ZOFRAN) IV, oxyCODONE, polyvinyl alcohol, sorbitol, zolpidem  Physical Exam Constitutional:      General: He is not in acute distress. Pulmonary:     Effort: Pulmonary effort is normal.  Skin:    General: Skin is warm and dry.  Neurological:     Mental Status: He is alert and oriented to person, place, and time.  Psychiatric:        Mood and Affect: Mood normal.        Behavior: Behavior normal.            Vital Signs: BP (!) 100/59 (BP Location: Right Arm)    Pulse 78    Temp 97.7 F (36.5 C)    Resp 17    Ht 5\' 8"  (1.727 m)    Wt 62.2 kg    SpO2 100%    BMI 20.85 kg/m  SpO2: SpO2: 100 % O2 Device: O2 Device: Room Air O2 Flow Rate:    Intake/output summary:  Intake/Output Summary (Last 24 hours) at 04/03/2021 5188 Last data filed at 04/03/2021 0800 Gross per 24 hour  Intake 840 ml  Output --   Net 840 ml   LBM:   Baseline Weight: Weight: 65.3 kg Most recent weight: Weight: 62.2 kg       Palliative Assessment/Data: PPS 50%    Flowsheet Rows    Flowsheet Row Most Recent Value  Intake Tab   Referral Department Hospitalist  Unit at Time of Referral Med/Surg Unit  Palliative Care Primary Diagnosis Nephrology  Date Notified 04/01/21  Palliative Care Type New Palliative care  Reason for referral Clarify Goals of Care  Date of Admission 03/31/21  Date first seen by Palliative Care 04/02/21  # of days Palliative referral response time 1 Day(s)  # of days IP prior to Palliative referral 1  Clinical Assessment   Psychosocial & Spiritual Assessment   Palliative Care Outcomes        Patient Active Problem List   Diagnosis Date Noted   Hypervolemia associated with renal insufficiency 41/66/0630   Chronic systolic (congestive) heart failure ([place]) 03/31/2021   Refusal of blood transfusions as patient is Jehovah's Witness 03/31/2021   DNR (do not resuscitate) 03/31/2021   Failure to thrive in adult 03/31/2021   Mallory-Weiss tear    Volume overload 11/20/2020  ESRD (end stage renal disease) on dialysis ([place]) 11/20/2020   Colon cancer ([place]) 11/20/2020   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[place]) 11/20/2020   Hypertension 11/20/2020   Vasovagal episode 11/20/2020   Hematemesis 11/20/2020   Acute on chronic combined systolic and diastolic CHF (congestive heart failure) ([place]) 11/20/2020   Acute pulmonary edema ([place])     Palliative Care Assessment & Plan   HPI: 80 y.o. male  with past medical history of  ESRD on HD; HTN; chronic hep C with cirrhosis; Jehovah's witness; chronic systolic CHF; and colon cancer admitted on 03/31/2021 with weakness and SOB. Patient diagnosed with hypervolemia associated with renal insufficiency and adult failure to thrive. Patient reports not feeling well for last 3-4 months and is considering stopping HD. PMT consulted to assist with [place]  discussions.   Assessment: Initially alone with patient. Tells me he feels well today.  We review conversation from yesterday. He tells me he has decided he no longer wants to continue HD. We review our discussion about hospice yesterday, he confirms he would like to proceed with transition to hospice facility.  During this conversation, patient's son [name] Colon arrived. He shares his support of patient's decisions to dc HD and transfer to hospice. Reviewed hospice facilities - they would like [facility] as they have known people there and it is close to their home. We discussed logistics of transition over. We discussed that while Mr. Ellis remains in the hospital awaiting a bed at [facility] place we will focus on his comfort and provide the type of care he will receive at the facility - all agree.  Plan shared with Dr. Eliseo Squires, nephrology, RN, [facility], and hospice liaison.   Recommendations/Plan: No further HD Focus on comfort - patient denies complaints - PRN medications needed if symptoms arise Transfer to [facility] when bed available  Goals of Care and Additional Recommendations: Limitations on Scope of Treatment: Full Comfort Care  Code Status: DNR  Prognosis:  < 2 weeks  Discharge Planning: [place] was discussed with patient, son, DIL, RN, TOC, hospice liaison, Dr. Eliseo Squires, nephrology  Thank you for allowing the Palliative Medicine Team to assist in the care of this patient.   Total Time 35 minutes Prolonged Time Billed  no       Greater than 50%  of this time was spent counseling and coordinating care related to the above assessment and plan.  Juel Burrow, DNP, [facility] Palliative Medicine Team Team Phone # [phone]  Pager [phone]

## 2021-04-03 NOTE — Discharge Summary (Signed)
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 male with medical history significant of ESRD on HD; HTN; chronic hep C with cirrhosis; Jehovah's witness; chronic systolic CHF; and colon cancer presenting with weakness/SOB.  He was last hospitalized from 8/16-30 for CP and missed HD; he required BIPAP and a NTG drip and developed bradycardia requiring CPR.  He got up this AM to go to HD and he just couldn't go.  He felt so weak and bad.  He did not feel well yesterday, about the same.  This has been going on for about 3-4 months, since he has been on HD.  He is miserable on the days he doesn't do HD and lays around all day.  He called his family this AM and felt ok but felt so bad that he called back an hour later and told them he needed to come to the hospital.   Hospital Course by Problem:   Hypervolemia associated with renal insufficiency -transitioned to comfort care   Failure to thrive in adult- (present on admission) -transitioned to comfort care Refusal of blood transfusions as patient is Jehovah's Witness -Hgb appears to be stable at this time so symptomatic anemia does not appear to be the cause of his fatigue/weakness -Regardless, he  would not accept blood products Chronic systolic (congestive) heart failure ([place]) -Echo in 11/2020 showed EF 45-50% and grade 1 diastolic dysfunction -He may have mild volume overload currently but this is controlled with HD Hypertension- (present on admission) -Continue Demadex 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[place])- (present on admission) Colon cancer ([place])- (present on admission)       Medical Consultants:   Renal Palliative care   Discharge Exam:   Vitals:   04/03/21 0427 04/03/21 0932  BP: (!) 100/59 106/66  Pulse: 78 84  Resp: 17 18  Temp: 97.7 F (36.5 C) (!) 97.5 F (36.4 C)  SpO2: 100% 100%   Vitals:   04/02/21 1647 04/02/21 2042 04/03/21 0427 04/03/21 0932  BP: (!) 93/56 101/63 (!) 100/59 106/66  Pulse: 88 93 78 84  Resp: 18 18 17 18   Temp: 98.1 F (36.7 C) (!) 97.5 F (36.4 C) 97.7 F (36.5 C) (!) 97.5 F (36.4 C)  TempSrc: Oral Oral  Oral  SpO2: 98% 100% 100% 100%  Weight:  62.2 kg    Height:        General exam: Appears calm and comfortable.   The results of significant diagnostics from this hospitalization (including imaging, microbiology, ancillary and laboratory) are listed below for reference.     Procedures and Diagnostic Studies:   DG Chest Portable 1 View  Result Date: 03/31/2021 CLINICAL DATA:  Shortness of breath. EXAM: PORTABLE CHEST 1 VIEW COMPARISON:  02/05/2021 FINDINGS: 1223 hours.  The cardio pericardial silhouette is enlarged. There is pulmonary vascular congestion suspected interstitial edema. The visualized bony structures of the thorax show no acute abnormality. Telemetry leads overlie the chest. IMPRESSION: Enlarged cardiopericardial silhouette with pulmonary vascular congestion and probable interstitial edema. Electronically Signed   By: Misty Stanley M.D.   On: 03/31/2021 12:32     Labs:   Basic Metabolic Panel: Recent Labs  Lab 03/31/21 1237 04/01/21 0450  NA 137 139  K 5.1 5.0  CL 97* 99  CO2 27 24  GLUCOSE  111* 93  BUN 59* 70*  CREATININE 9.54* 10.65*  CALCIUM 9.6 9.5   GFR Estimated Creatinine Clearance: 4.7 mL/min (A) (by C-G formula based on SCr of 10.65 mg/dL (H)). Liver Function Tests: Recent Labs  Lab 03/31/21 1237  AST 28  ALT 40  ALKPHOS 113  BILITOT 1.3*  PROT 7.0  ALBUMIN 2.9*   Recent Labs  Lab 03/31/21 1237  LIPASE 53*   No results for input(s): AMMONIA in the last 168 hours. Coagulation profile No results for input(s): INR, PROTIME in the last 168 hours.  CBC: Recent Labs  Lab 03/31/21 1237 04/01/21 0450  WBC 6.1 5.6  NEUTROABS 3.9  --   HGB 11.4* 11.4*  HCT 34.7* 34.4*  MCV 95.9 95.8  PLT 97* 101*   Cardiac Enzymes: No results for input(s): CKTOTAL, CKMB, CKMBINDEX, TROPONINI in the last 168 hours. BNP: Invalid input(s): POCBNP CBG: Recent Labs  Lab 04/02/21 1126 04/02/21 1645 04/02/21 2044 04/03/21 0625 04/03/21 1115  GLUCAP 103* 142* 145* 96 129*   D-Dimer No results for input(s): DDIMER in the last 72 hours. Hgb A1c No results for input(s): HGBA1C in the last 72 hours. Lipid Profile No results for input(s): CHOL, HDL, LDLCALC, TRIG, CHOLHDL, LDLDIRECT in the last 72 hours. Thyroid function studies No results for input(s): TSH, T4TOTAL, T3FREE, THYROIDAB in the last 72 hours.  Invalid input(s): FREET3 Anemia work up No results for input(s): VITAMINB12, FOLATE, FERRITIN, TIBC, IRON, RETICCTPCT in the last 72 hours. Microbiology Recent Results (from the past 240 hour(s))  Resp Panel by RT-PCR (Flu A&B, Covid) Nasopharyngeal Swab     Status: None   Collection Time: 03/31/21 12:08 PM   Specimen: Nasopharyngeal Swab; Nasopharyngeal(NP) swabs in vial transport medium  Result Value Ref Range Status   SARS Coronavirus 2 by RT PCR NEGATIVE NEGATIVE Final    Comment: (NOTE) SARS-CoV-2 target nucleic acids are NOT DETECTED.  The SARS-CoV-2 RNA is generally detectable in upper respiratory specimens during the acute phase of infection. The  lowest concentration of SARS-CoV-2 viral copies this assay can detect is 138 copies/mL. A negative result does not preclude SARS-Cov-2 infection and should not be used as the sole basis for treatment or other patient management decisions. A negative result may occur with  improper specimen collection/handling, submission of specimen other than nasopharyngeal swab, presence of viral mutation(s) within the areas targeted by this assay, and inadequate number of viral copies(<138 copies/mL). A negative result must be combined with clinical observations, patient history, and epidemiological information. The expected result is Negative.  Fact Sheet for Patients:  EntrepreneurPulse.com.au  Fact Sheet for Healthcare Providers:  IncredibleEmployment.be  This test is no t yet approved or cleared by the [place] FDA and  has been authorized for detection and/or diagnosis of SARS-CoV-2 by FDA under an Emergency Use Authorization (EUA). This EUA will remain  in effect (meaning this test can be used) for the duration of the COVID-19 declaration under Section  564(b)(1) of the Act, 21 U.S.C.section 360bbb-3(b)(1), unless the authorization is terminated  or revoked sooner.       Influenza A by PCR NEGATIVE NEGATIVE Final   Influenza B by PCR NEGATIVE NEGATIVE Final    Comment: (NOTE) The Xpert Xpress SARS-CoV-2/FLU/RSV plus assay is intended as an aid in the diagnosis of influenza from Nasopharyngeal swab specimens and should not be used as a sole basis for treatment. Nasal washings and aspirates are unacceptable for Xpert Xpress SARS-CoV-2/FLU/RSV testing.  Fact Sheet for Patients: EntrepreneurPulse.com.au  Fact Sheet for Healthcare Providers: IncredibleEmployment.be  This test is not yet approved or cleared by the [place] FDA and has been authorized for detection and/or diagnosis of SARS-CoV-2 by FDA under  an Emergency Use Authorization (EUA). This EUA will remain in effect (meaning this test can be used) for the duration of the COVID-19 declaration under Section 564(b)(1) of the Act, 21 U.S.C. section 360bbb-3(b)(1), unless the authorization is terminated or revoked.  Performed at [facility], [place] [address] [place], [place] [address]      Discharge Instructions:   Discharge Instructions     Diet general   Complete by: As directed    Increase activity slowly   Complete by: As directed       Allergies as of 04/03/2021   No Known Allergies      Medication List     STOP taking these medications    allopurinol 300 MG tablet Commonly known as: ZYLOPRIM   ferric citrate 1 GM 210 MG(Fe) tablet Commonly known as: AURYXIA   folic acid 1 MG tablet Commonly known as: FOLVITE   nitroGLYCERIN 0.4 MG SL tablet Commonly known as: NITROSTAT       TAKE these medications    camphor-menthol lotion Commonly known as: SARNA Apply 1 application topically every 8 (eight) hours as needed for itching.   hydrOXYzine 25 MG tablet Commonly known as: ATARAX Take 1 tablet (25 mg total) by mouth every 8 (eight) hours as needed for itching.   LORazepam 0.5 MG tablet Commonly known as: ATIVAN Take 1 tablet (0.5 mg total) by mouth every 4 (four) hours as needed for anxiety.   oxyCODONE 5 MG immediate release tablet Commonly known as: Oxy IR/ROXICODONE Take 1 tablet (5 mg total) by mouth every 4 (four) hours as needed for moderate pain (or dyspnea).   tamsulosin 0.4 MG Caps capsule Commonly known as: FLOMAX Take 0.4 mg by mouth.   torsemide 20 MG tablet Commonly known as: DEMADEX Take 20 mg by mouth daily.          Time coordinating discharge: 35 min  Signed:  Geradine Girt DO  [facility] 04/03/2021, 12:58 PM

## 2021-04-10 ENCOUNTER — Other Ambulatory Visit: Payer: Medicare Other | Admitting: Hospice

## 2021-04-10 ENCOUNTER — Other Ambulatory Visit: Payer: Self-pay

## 2021-05-07 DEATH — deceased

## 2022-11-14 IMAGING — US US ABDOMEN LIMITED
1 series · 14 of 25 positions shown · non-contrast
Comparison: None.

CLINICAL DATA: Hepatitis C cirrhosis

EXAM:
ULTRASOUND ABDOMEN LIMITED RIGHT UPPER QUADRANT

[Series 1: us abdomen limited · 0.25mm/px · 14 of 38 slices shown]
[im 1/38]
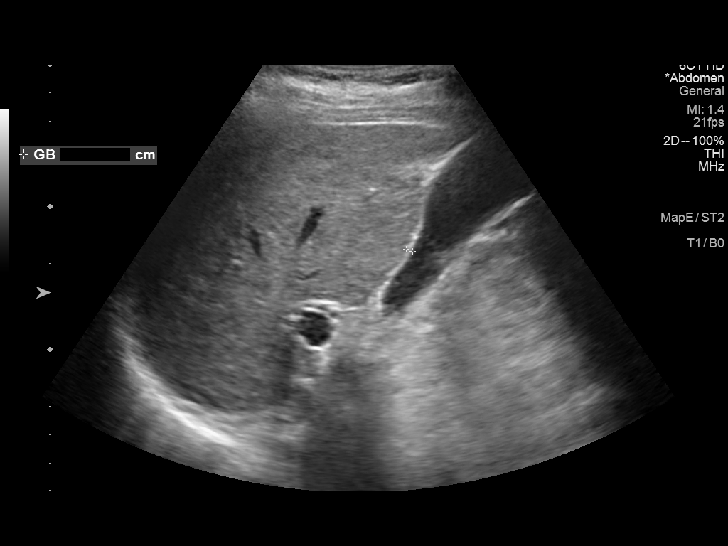
[im 4/38]
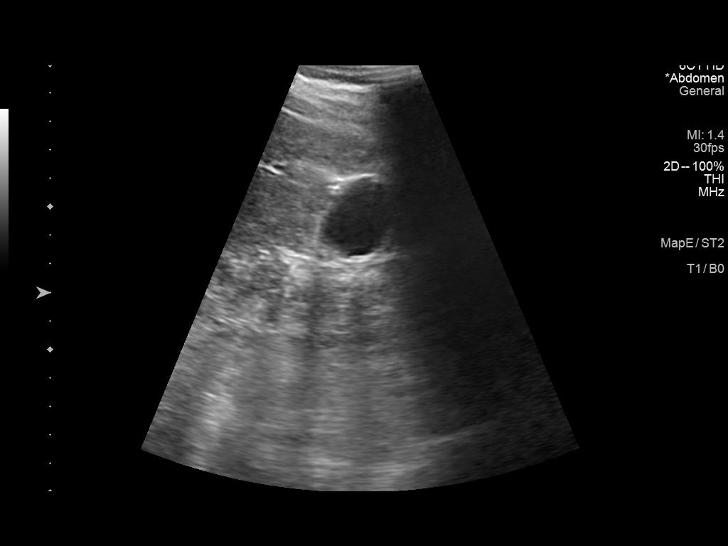
[im 7/38]
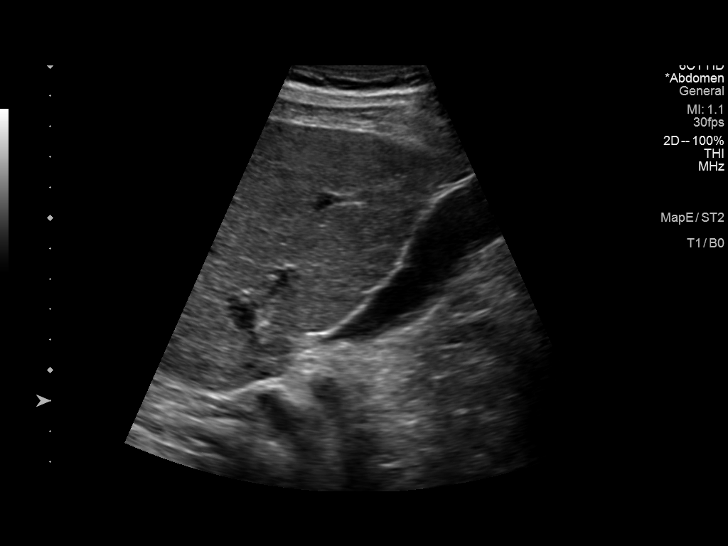
[im 10/38]
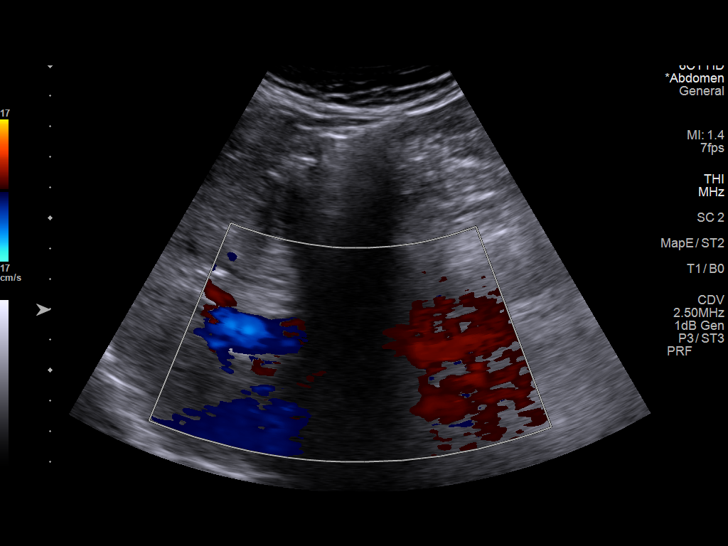
[im 13/38]
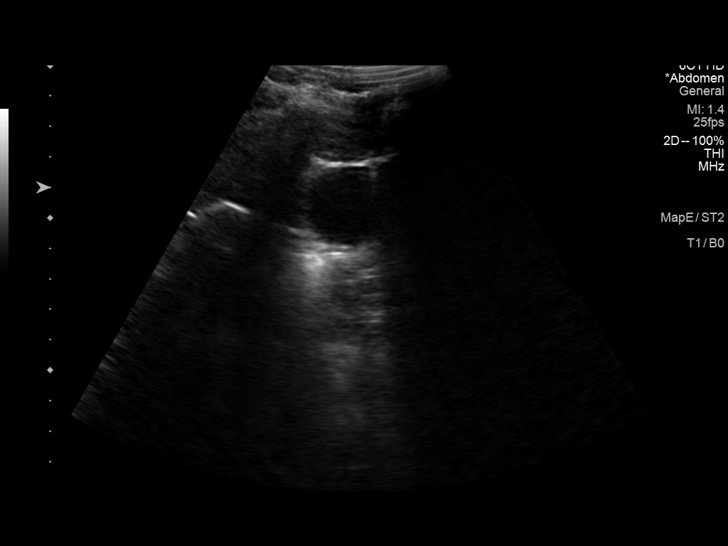
[im 14/38]
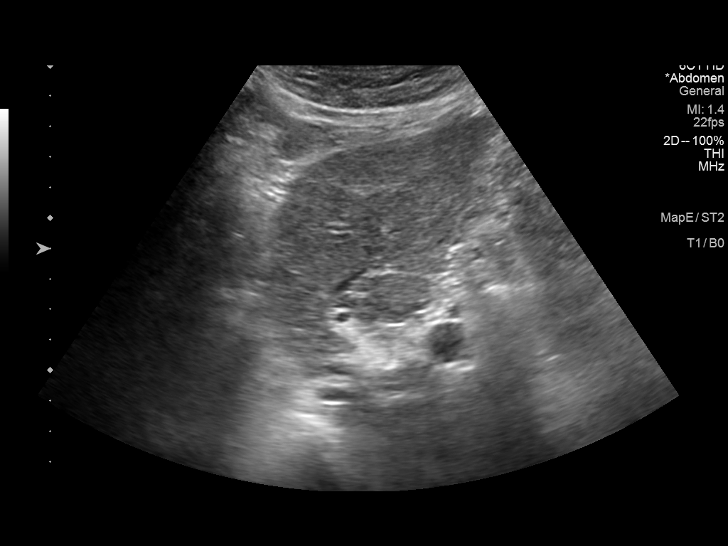
[im 17/38]
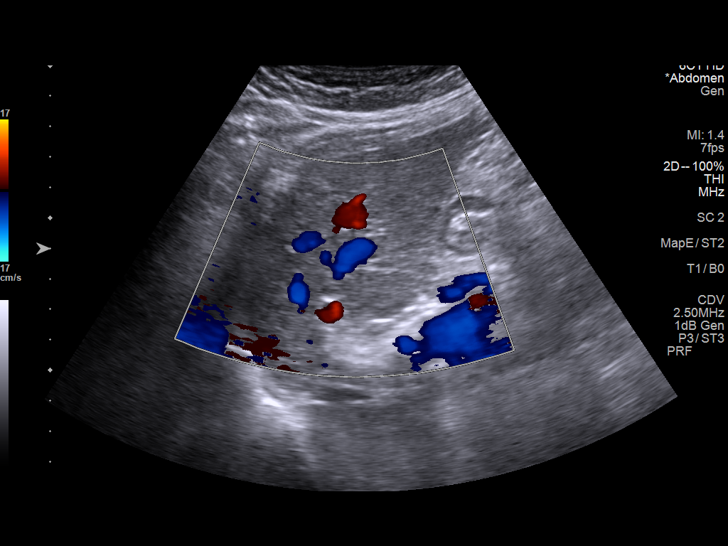
[im 21/38]
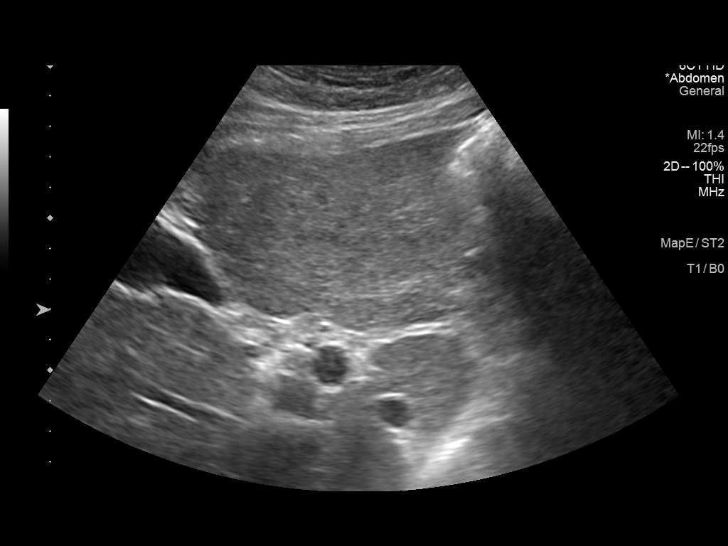
[im 24/38]
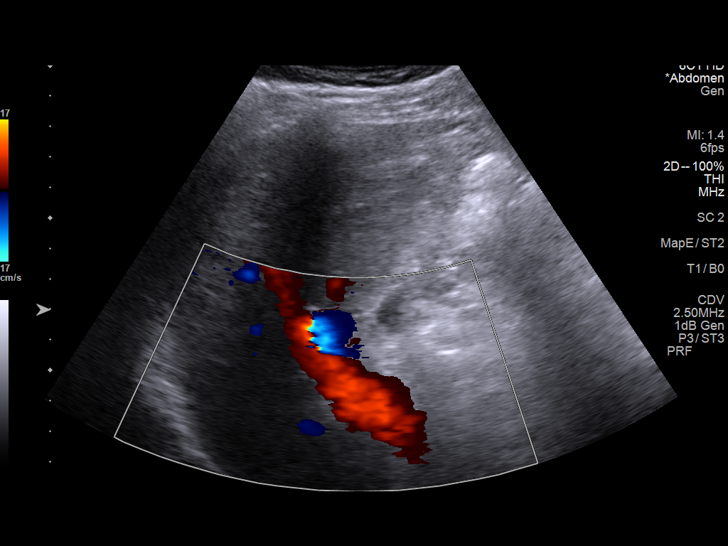
[im 25/38]
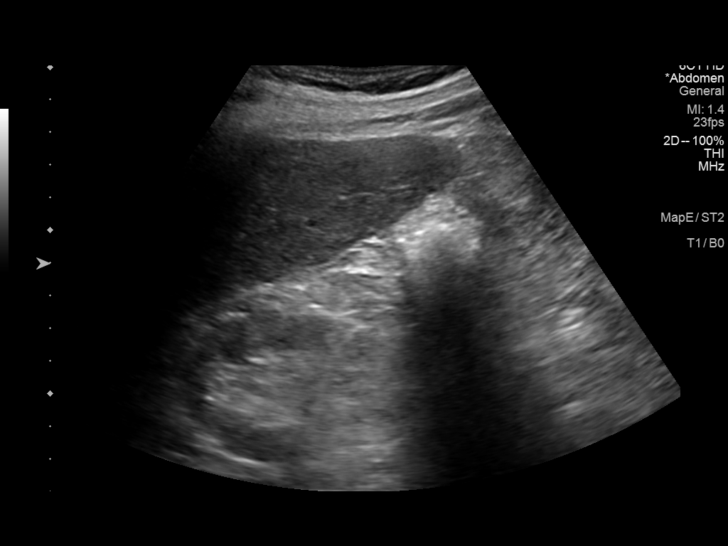
[im 28/38]
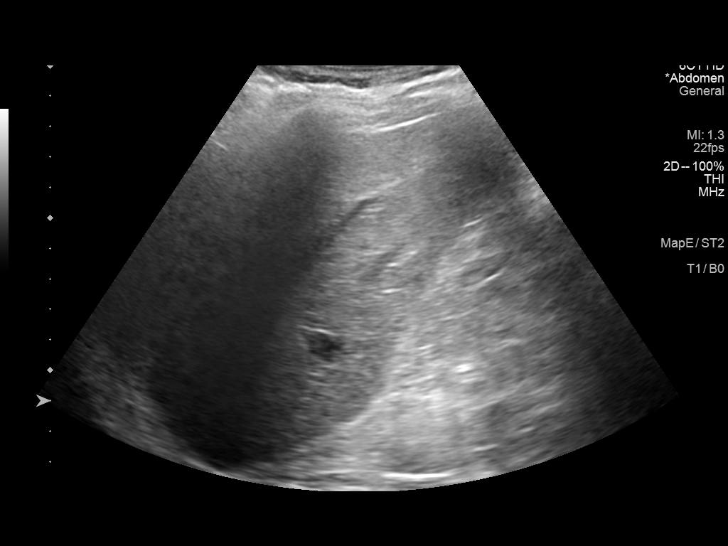
[im 31/38]
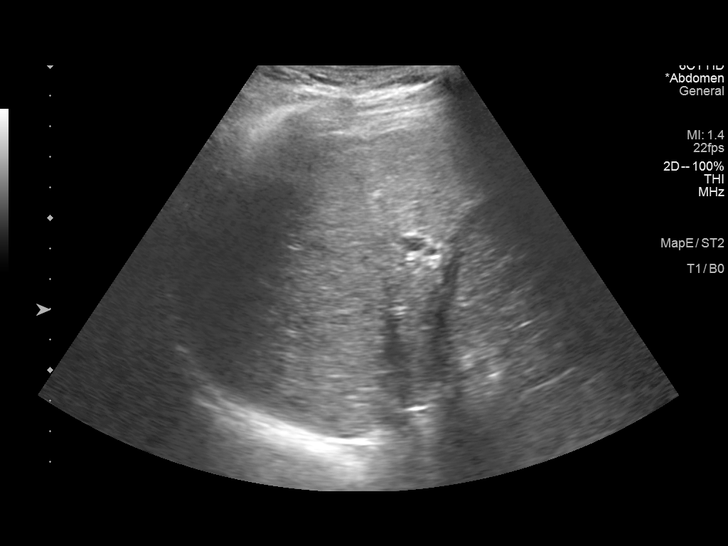
[im 34/38]
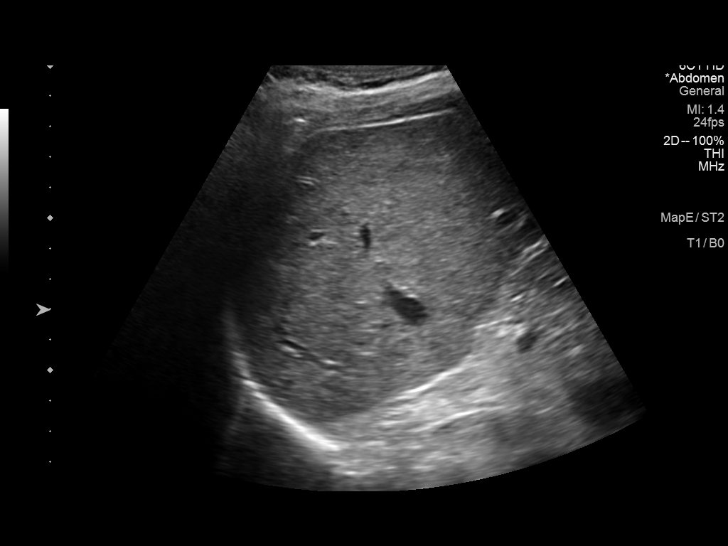
[im 38/38]
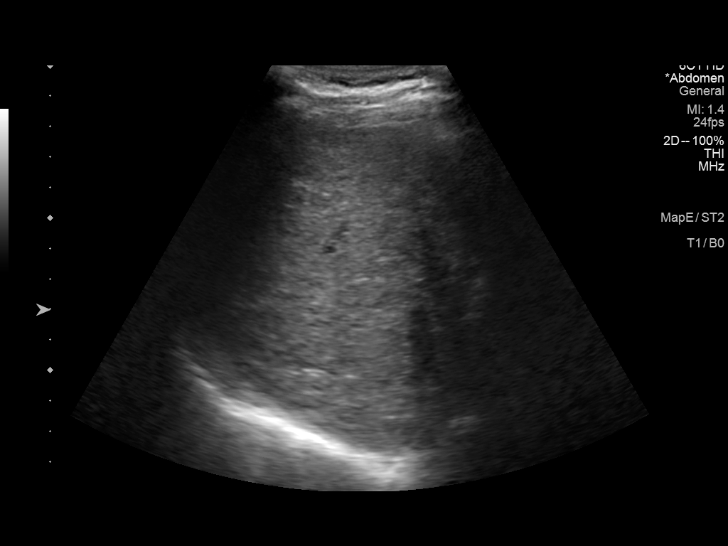

[14 of 25 positions shown; findings below may reference images not displayed]

FINDINGS: Gallbladder:

No gallstones or wall thickening visualized. No sonographic Murphy
sign noted by sonographer.

Common bile duct:

Diameter: 2.4 mm

Liver:

Coarse heterogeneous echotexture with probable subtle contour
nodularity corresponding to history of cirrhosis. No focal hepatic
abnormality. Portal vein is patent on color Doppler imaging with
normal direction of blood flow towards the liver.

Other: None.
IMPRESSION: Coarse hepatic echotexture with probable subtle contour nodularity,
corresponding to history of cirrhosis. Negative for hepatic mass
lesion by ultrasound.
